# Patient Record
Sex: Male | Born: 1945 | Race: White | Hispanic: No | Marital: Married | Smoking: Former smoker
Health system: Southern US, Community
[De-identification: ages and names within clinical notes are randomized; demographics above are authoritative.]

## PROBLEM LIST (undated history)

## (undated) DIAGNOSIS — I714 Abdominal aortic aneurysm, without rupture, unspecified: Secondary | ICD-10-CM

## (undated) DIAGNOSIS — I10 Essential (primary) hypertension: Secondary | ICD-10-CM

## (undated) DIAGNOSIS — K449 Diaphragmatic hernia without obstruction or gangrene: Secondary | ICD-10-CM

## (undated) DIAGNOSIS — I739 Peripheral vascular disease, unspecified: Secondary | ICD-10-CM

## (undated) DIAGNOSIS — E785 Hyperlipidemia, unspecified: Secondary | ICD-10-CM

## (undated) DIAGNOSIS — I2510 Atherosclerotic heart disease of native coronary artery without angina pectoris: Secondary | ICD-10-CM

## (undated) DIAGNOSIS — R011 Cardiac murmur, unspecified: Secondary | ICD-10-CM

## (undated) DIAGNOSIS — M1710 Unilateral primary osteoarthritis, unspecified knee: Secondary | ICD-10-CM

## (undated) DIAGNOSIS — G4730 Sleep apnea, unspecified: Secondary | ICD-10-CM

## (undated) DIAGNOSIS — I219 Acute myocardial infarction, unspecified: Secondary | ICD-10-CM

## (undated) DIAGNOSIS — G4733 Obstructive sleep apnea (adult) (pediatric): Secondary | ICD-10-CM

## (undated) DIAGNOSIS — Z8679 Personal history of other diseases of the circulatory system: Secondary | ICD-10-CM

## (undated) DIAGNOSIS — M179 Osteoarthritis of knee, unspecified: Secondary | ICD-10-CM

## (undated) DIAGNOSIS — I252 Old myocardial infarction: Secondary | ICD-10-CM

## (undated) DIAGNOSIS — K649 Unspecified hemorrhoids: Secondary | ICD-10-CM

## (undated) HISTORY — PX: REPLACEMENT TOTAL KNEE: SUR1224

## (undated) HISTORY — DX: Unilateral primary osteoarthritis, unspecified knee: M17.10

## (undated) HISTORY — PX: CORONARY ARTERY BYPASS GRAFT: SHX141

## (undated) HISTORY — DX: Personal history of other diseases of the circulatory system: Z86.79

## (undated) HISTORY — DX: Peripheral vascular disease, unspecified: I73.9

## (undated) HISTORY — DX: Obstructive sleep apnea (adult) (pediatric): G47.33

## (undated) HISTORY — DX: Cardiac murmur, unspecified: R01.1

## (undated) HISTORY — DX: Abdominal aortic aneurysm, without rupture, unspecified: I71.40

## (undated) HISTORY — PX: CHOLECYSTECTOMY: SHX55

## (undated) HISTORY — DX: Abdominal aortic aneurysm, without rupture: I71.4

## (undated) HISTORY — DX: Atherosclerotic heart disease of native coronary artery without angina pectoris: I25.10

## (undated) HISTORY — DX: Diaphragmatic hernia without obstruction or gangrene: K44.9

## (undated) HISTORY — DX: Unspecified hemorrhoids: K64.9

## (undated) HISTORY — DX: Sleep apnea, unspecified: G47.30

## (undated) HISTORY — DX: Essential (primary) hypertension: I10

## (undated) HISTORY — DX: Osteoarthritis of knee, unspecified: M17.9

## (undated) HISTORY — DX: Old myocardial infarction: I25.2

## (undated) HISTORY — DX: Hyperlipidemia, unspecified: E78.5

---

## 1990-04-27 DIAGNOSIS — I252 Old myocardial infarction: Secondary | ICD-10-CM

## 1990-04-27 HISTORY — PX: CARDIAC CATHETERIZATION: SHX172

## 1990-04-27 HISTORY — DX: Old myocardial infarction: I25.2

## 1999-05-04 ENCOUNTER — Encounter: Payer: Self-pay | Admitting: Emergency Medicine

## 1999-05-04 ENCOUNTER — Inpatient Hospital Stay (HOSPITAL_COMMUNITY): Admission: EM | Admit: 1999-05-04 | Discharge: 1999-05-06 | Payer: Self-pay | Admitting: Emergency Medicine

## 1999-05-05 ENCOUNTER — Encounter: Payer: Self-pay | Admitting: Emergency Medicine

## 1999-05-12 ENCOUNTER — Inpatient Hospital Stay (HOSPITAL_COMMUNITY): Admission: RE | Admit: 1999-05-12 | Discharge: 1999-05-13 | Payer: Self-pay | Admitting: General Surgery

## 1999-05-12 ENCOUNTER — Encounter: Payer: Self-pay | Admitting: General Surgery

## 1999-05-12 ENCOUNTER — Encounter (INDEPENDENT_AMBULATORY_CARE_PROVIDER_SITE_OTHER): Payer: Self-pay | Admitting: Specialist

## 2002-02-23 ENCOUNTER — Ambulatory Visit (HOSPITAL_BASED_OUTPATIENT_CLINIC_OR_DEPARTMENT_OTHER): Admission: RE | Admit: 2002-02-23 | Discharge: 2002-02-23 | Payer: Self-pay | Admitting: Family Medicine

## 2003-04-28 HISTORY — PX: ABDOMINAL AORTIC ANEURYSM REPAIR: SHX42

## 2004-01-08 ENCOUNTER — Emergency Department (HOSPITAL_COMMUNITY): Admission: EM | Admit: 2004-01-08 | Discharge: 2004-01-09 | Payer: Self-pay | Admitting: Emergency Medicine

## 2004-02-05 ENCOUNTER — Encounter: Admission: RE | Admit: 2004-02-05 | Discharge: 2004-02-05 | Payer: Self-pay | Admitting: Vascular Surgery

## 2004-02-07 ENCOUNTER — Ambulatory Visit (HOSPITAL_COMMUNITY): Admission: RE | Admit: 2004-02-07 | Discharge: 2004-02-07 | Payer: Self-pay | Admitting: Vascular Surgery

## 2004-02-21 ENCOUNTER — Encounter (INDEPENDENT_AMBULATORY_CARE_PROVIDER_SITE_OTHER): Payer: Self-pay | Admitting: *Deleted

## 2004-02-21 ENCOUNTER — Inpatient Hospital Stay (HOSPITAL_COMMUNITY): Admission: RE | Admit: 2004-02-21 | Discharge: 2004-02-27 | Payer: Self-pay | Admitting: Vascular Surgery

## 2005-05-19 ENCOUNTER — Encounter: Admission: RE | Admit: 2005-05-19 | Discharge: 2005-05-19 | Payer: Self-pay | Admitting: Vascular Surgery

## 2006-11-19 ENCOUNTER — Emergency Department (HOSPITAL_COMMUNITY): Admission: EM | Admit: 2006-11-19 | Discharge: 2006-11-19 | Payer: Self-pay | Admitting: Emergency Medicine

## 2006-12-03 ENCOUNTER — Encounter: Admission: RE | Admit: 2006-12-03 | Discharge: 2006-12-03 | Payer: Self-pay | Admitting: Gastroenterology

## 2007-10-18 ENCOUNTER — Ambulatory Visit: Payer: Self-pay | Admitting: Vascular Surgery

## 2009-07-28 IMAGING — RF DG SMALL BOWEL
7 series · 7 of 7 positions shown · non-contrast
Comparison: none

CLINICAL DATA: Abnormal small bowel on [REDACTED] abdominal pelvic CT 11/19/06 with left sided pain, nausea, prior AAA surgery, cholecystectomy. 
 SMALL BOWEL SERIES:
 The preliminary abdominal radiograph is unremarkable.

[Series 1: run · 1 of 1 slices shown (1 of 4)]
[im 1/1]
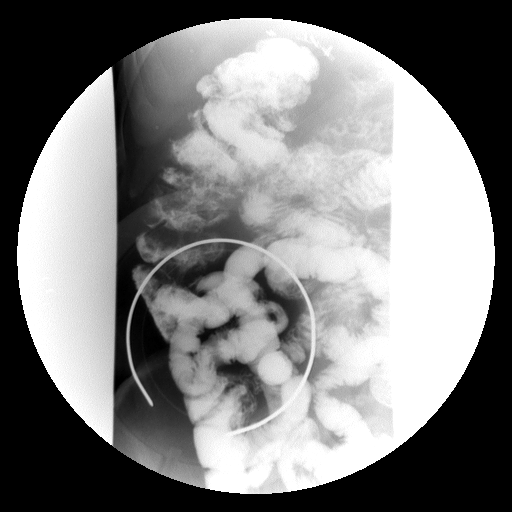

[Series 2: run · 1 of 1 slices shown (2 of 4)]
[im 1/1]
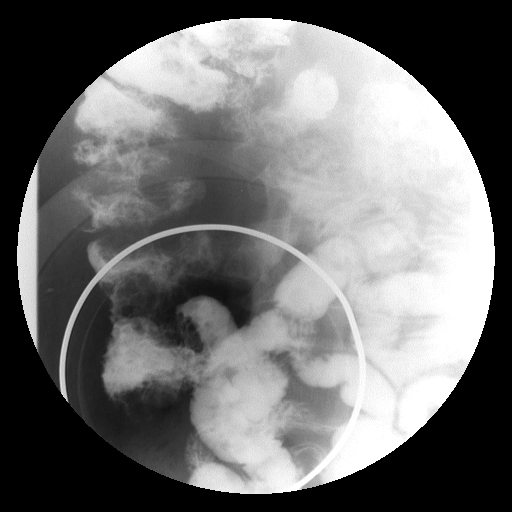

[Series 3: run · 1 of 1 slices shown (3 of 4)]
[im 1/1]
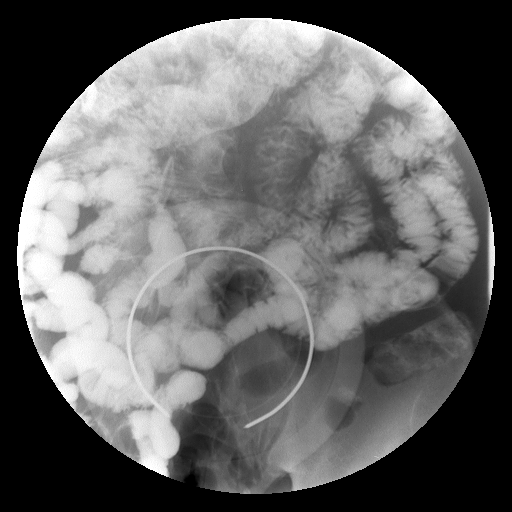

[Series 4: run · 1 of 1 slices shown (4 of 4)]
[im 1/1]
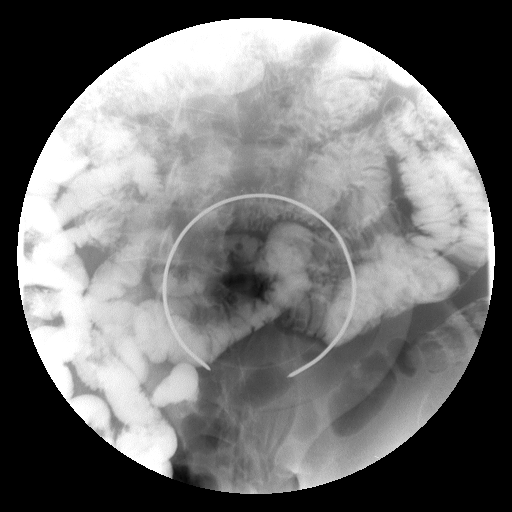

[Series 1001: view not recorded · 0.20mm/px · 1 of 1 slices shown (1 of 3)]
[im 1/1]
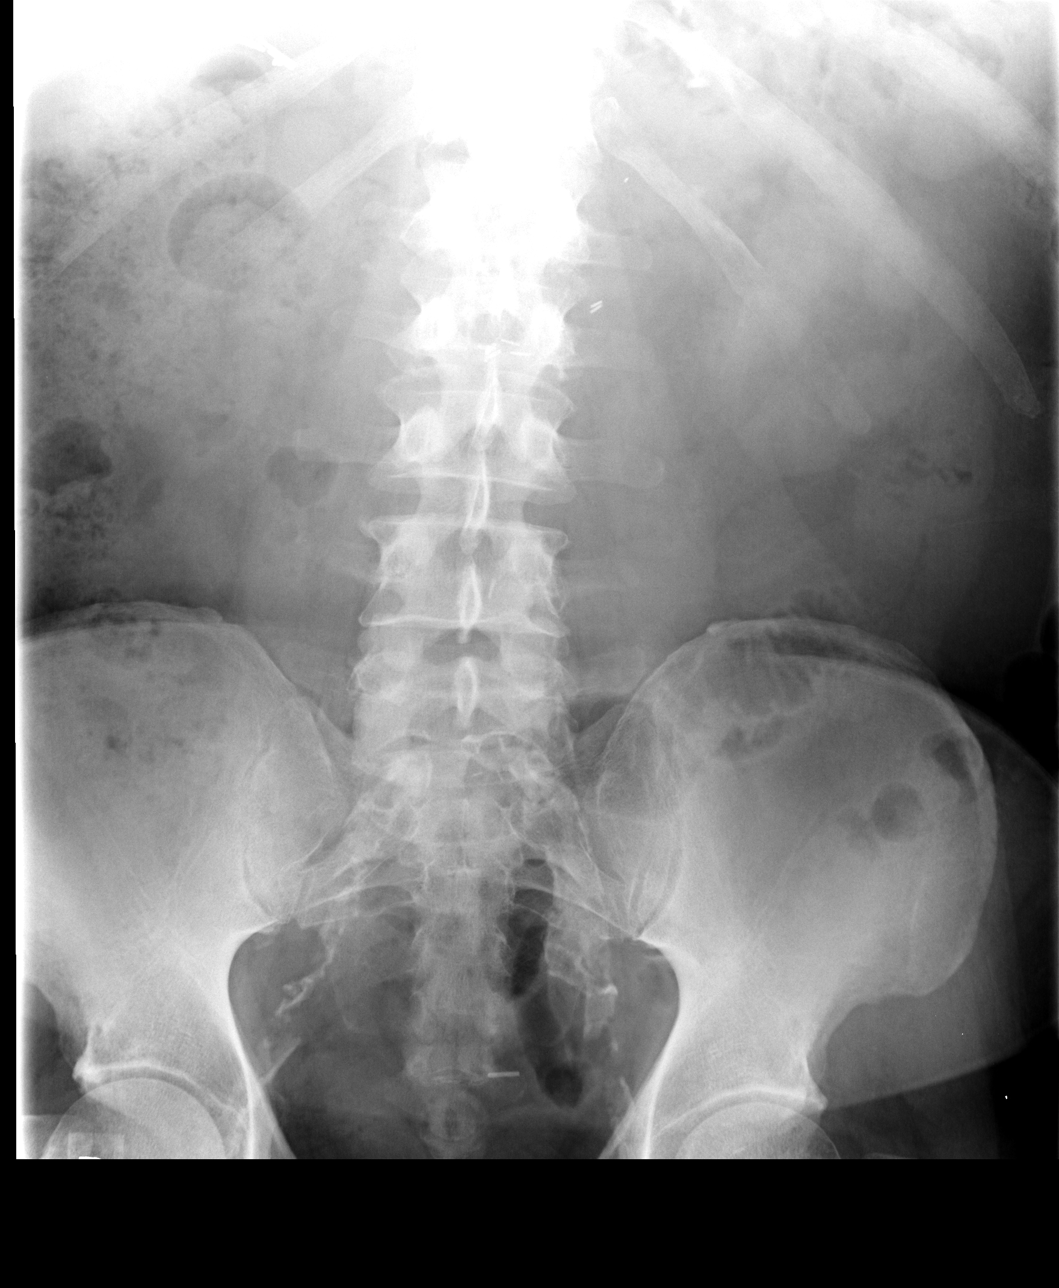

[Series 1002: view not recorded · 0.20mm/px · 1 of 1 slices shown (2 of 3)]
[im 1/1]
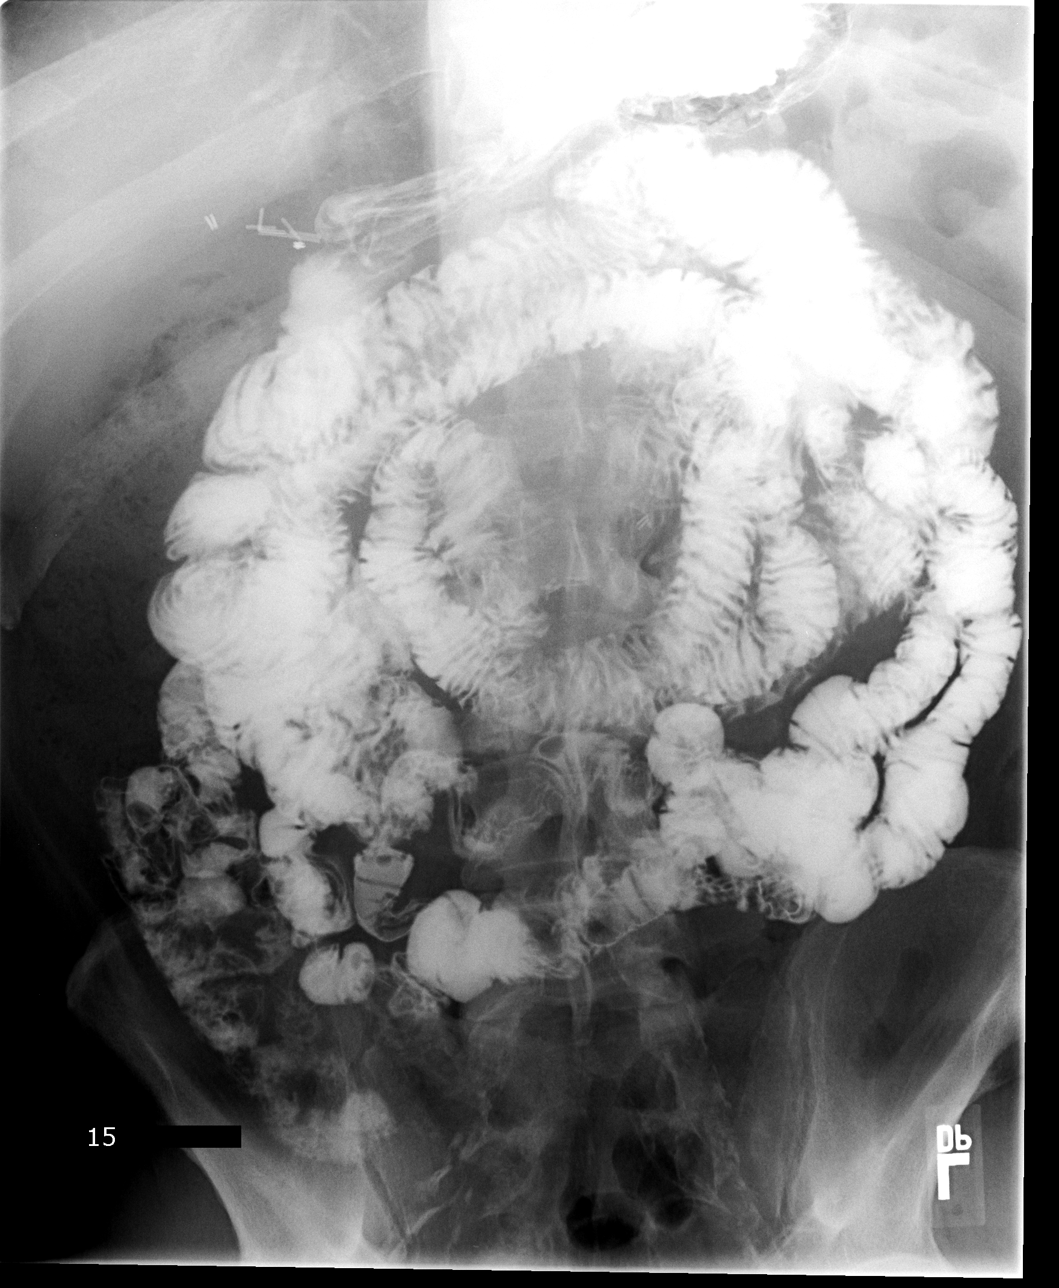

[Series 1003: view not recorded · 0.20mm/px · 1 of 1 slices shown (3 of 3)]
[im 1/1]
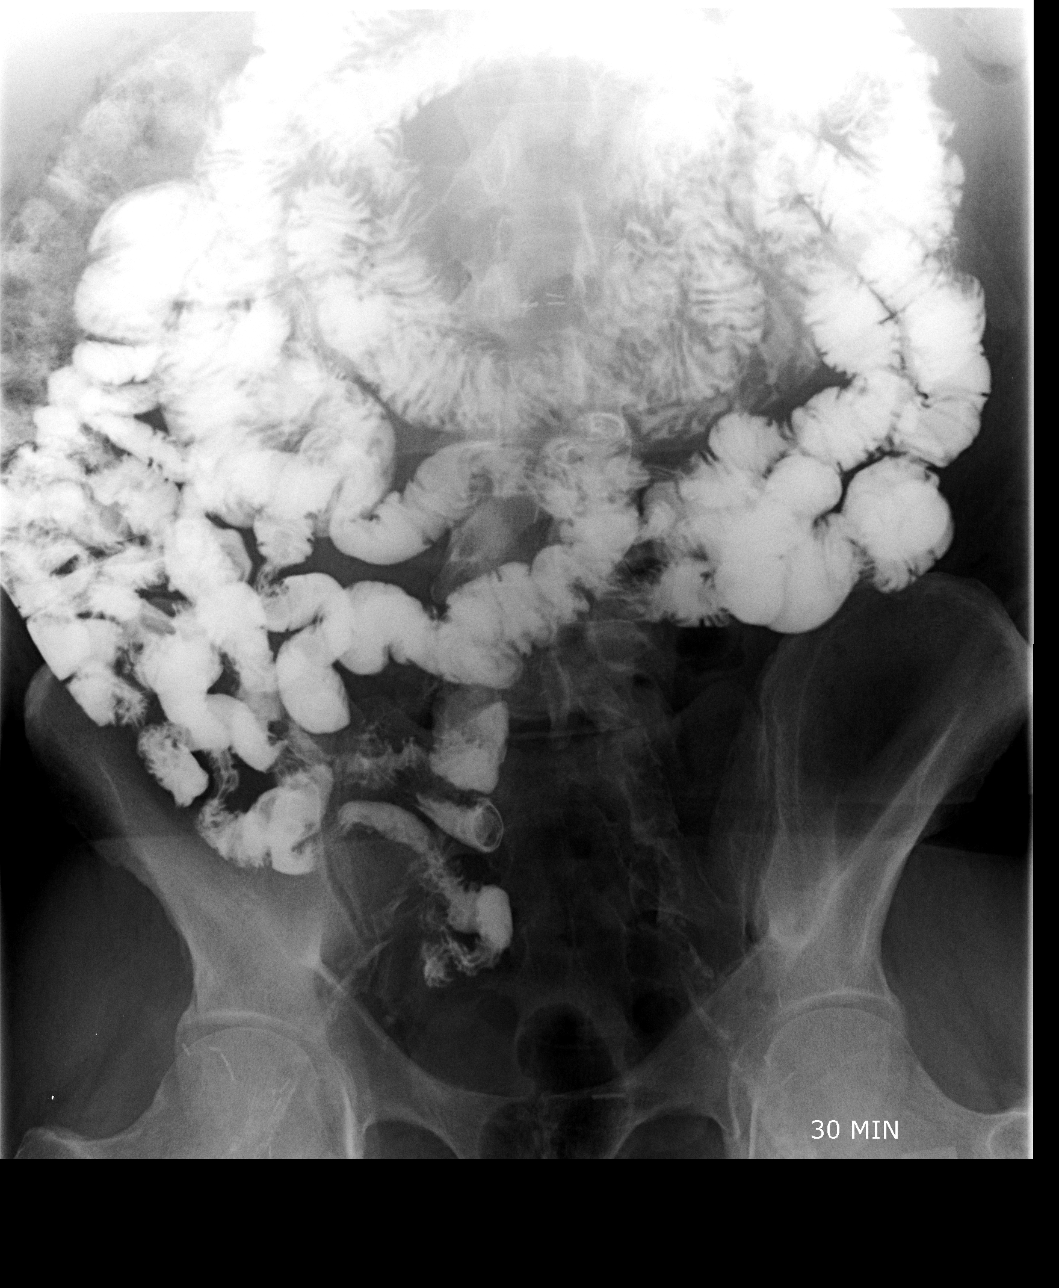

[7 of 7 positions shown; findings below may reference images not displayed]

Small bowel transit of contrast is within normal limits. There is no evidence of small bowel dilatation, strictures, or masses. The mucosal fold pattern is within normal limits. Spot films of the terminal ileum are unremarkable.  No obstructive nor inflammatory findings are seen specifically at the left lower quadrant in region of CT abnormality 11/19/06 consistent with interval resolution.  Barium transit time to colon is 30 minutes.  No change in vascular calcification, surgical clips of prior abdominal aortic aneurysmal surgery and cholecystectomy.
IMPRESSION: Normal small bowel series.

## 2010-05-18 ENCOUNTER — Encounter: Payer: Self-pay | Admitting: Vascular Surgery

## 2010-09-09 NOTE — Assessment & Plan Note (Signed)
OFFICE VISIT   HYDEN, SOLEY  DOB:  [birth year]                                       10/18/2007  OZHYQ#:[id number]   The patient returns for continued followup regarding his perirenal  aorta.  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  Last year, a CT scan was done at  [facility] and it was their opinion that the measurement was  overestimating the size because of an oblique neck of the suprarenal  aorta, and they felt that the suprarenal portion of the aorta was only  2.8 mm maximum diameter.  He also had a CT scan in August 2008, which I  reviewed today, which is similar to that finding with no evidence of  significant aneurysmal disease of the proximal renal arteries.  He is  biggest problems have been related to right knee pain in back pain.  He  has had no hemispheric or non-hemispheric TIAs, amaurosis fugax,  diplopia, blurred vision, syncope, chest pain, dyspnea on exertion, but  does have fatigue.   EXAM:  Blood pressure 141/85, heart rate 71, respirations 14.  Carotid  pulses 3+ with no audible bruits.  Neurologic:  Normal.  No palpable  adenopathy in the neck.  Chest:  Clear to auscultation.  Abdomen:  Soft,  nontender with no evidence of pulsatile mass.  Midline incision is well  healed.  He has 3+ femoral, popliteal, and dorsalis pedis pulses  bilaterally.  ABIs greater than 1 bilaterally.   I reassured him regarding all of these findings and do not think we need  to obtain CT scans on a regular basis.  He will return to see me on a  p.r.n. basis.   Quita Skye Hart Rochester, M.D.  Electronically Signed   JDL/MEDQ  D:  10/18/2007  T:  10/19/2007  Job:  [id number]   cc:   Chales Salmon. Abigail Miyamoto, M.D.

## 2010-09-12 NOTE — Op Note (Signed)
NAMEBENEDICT, Johnny [name]               ACCOUNT NO.:  [id number]   MEDICAL RECORD NO.:  [id number]          PATIENT TYPE:  OIB   LOCATION:  NA                           FACILITY:  [facility]   PHYSICIAN:  [name] Rochester, M.D.  DATE OF BIRTH:  1946/03/20   DATE OF PROCEDURE:  02/07/2004  DATE OF DISCHARGE:                                 OPERATIVE REPORT   PREOPERATIVE DIAGNOSIS:  Infrarenal abdominal aortic aneurysm.   POSTOPERATIVE DIAGNOSIS:  Infrarenal abdominal aortic aneurysm.   OPERATION PERFORMED:  Biplane abdominal aortogram via right common femoral  approach with bilateral iliac angiography.   SURGEON:  [name] Rochester, M.D.   ANESTHESIA:  Local Xylocaine and Versed 2 mg intravenously.   CONTRAST:  135 mL.   COMPLICATIONS:  None.   DESCRIPTION OF PROCEDURE:  The patient was taken to the [facility]  peripheral endovascular lab, placed in supine position at which time the  right groin was prepped with Betadine scrub and solution and draped in  routine sterile manner.  After infiltration with 1% Xylocaine, the right  common femoral artery was entered percutaneously, guidewire passed into the  suprarenal aorta under fluoroscopic guidance.  A 5 French sheath and dilator  were passed  over the guidewire, dilator removed and a standard graduated  pigtail catheter positioned in the suprarenal aorta.  Flush abdominal  aortogram was performed injecting 30 mL of contrast at 20 mL per second.  This revealed the aorta to be widely patent with single renal arteries  bilaterally.  Both renal arteries were also widely patent with no evidence  of any significant stenosis.  There was an infrarenal aneurysm beginning  approximately 1.5 to 2 cm distal to the renal arteries, which extended down  to the aortic bifurcation.  There was occlusion of the inferior mesenteric  artery.  Both iliac arteries were diffusely diseased with moderate stenosis  at the origin of the left common iliac artery  approximating 50 to 60% and an  approximate 90% stenosis in the distal left common iliac artery.  There was  a prominent bulge in the proximal right common iliac artery at the aortic  bifurcation.  Both distal common iliac arteries and the internal and  external iliac arteries were widely patent.  Lateral aortogram was then  performed injecting 20 mL of contrast 20 mL per second.  This revealed the  celiac axis and superior mesenteric arteries to be widely patent with no  stenosis.  The neck of the aneurysm angled somewhat anteriorly and there was  a significant bulge just at the level of the renal arteries and superiorly  with slight tapering just below the renal arteries before the infrarenal  aneurysm began. Following this, the catheter was withdrawn into the terminal  aorta and AP, LAO and RAO projections were all obtained injecting 20 mL of  contrast at 20 mL per second.  This revealed the iliac arteries to be widely  patent beginning in the distal common iliac arteries bilaterally, extending  into the internal and external iliac arteries down to the common femorals  and origin of the  superficial and profunda femoris bilaterally.  Having  tolerated the procedure well, the pigtail catheter was removed over the  guidewire.  The sheath removed, adequate compression applied.  No  complications ensued.   FINDINGS:  1.  Infrarenal abdominal aortic aneurysm.  2.  Single widely patent renal arteries bilaterally.  3.  90% stenosis distal left common iliac artery.  4.  Widely patent internal and external iliac arteries bilaterally.       JDL/MEDQ  D:  02/07/2004  T:  02/07/2004  Job:  [id number]

## 2010-09-12 NOTE — Op Note (Signed)
NAMEGATES, JIVIDEN NO.:  [id number]   MEDICAL RECORD NO.:  [id number]          PATIENT TYPE:  INP   LOCATION:  2306                         FACILITY:  [facility]   PHYSICIAN:  [name] Rochester, M.D.  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   OPERATION:  Resection and grafting of infrarenal abdominal aortic aneurysm  with insertion of an aorto-bi-proximal superficial femoral bypass using an  18 x 9 mm Hemashield Dacron graft.   SURGEON:  [name] Rochester, M.D.   FIRST ASSISTANT:  Di Kindle. Edilia Bo, M.D.   SECOND ASSISTANT:  Toribio Harbour, N.P.   ANESTHESIA:  General endotracheal.   PROCEDURE:  The patient was taken to the operating room and placed in the  supine position, at which time satisfactory general endotracheal anesthesia  was administered.  Abdomen and groins were prepped with Betadine scrub and  solution, draped in the routine sterile manner.  A Swan-Ganz catheter and  radial arterial line were inserted by anesthesia.  A midline incision was  made in the abdomen from xiphoid to pubis, carried down through subcutaneous  tissue and linea alba using the Bovie.  The peritoneal cavity was entered  and thoroughly explored.  Stomach, duodenum, small bowel, and colon were  unremarkable.  The liver was smooth and no palpable masses.  The gallbladder  was absent.  The transverse colon was elevated and the intestines were  reflected to the right side, retroperitoneum incised, exposing an infrarenal  aneurysm.  The main component of the aneurysm began about 3-4 cm distal to  the renal arteries, which measured about 6 cm in diameter.  This  extended  down to the bifurcation of the aorta, and both common iliac arteries were  heavily calcified throughout the abdomen down, including the external  iliacs.  There was a prominent bulge above the proximal neck, which extended  up to the superior mesenteric artery.  The aorta was exposed up to this  point to see if it was feasible to resect this, but the artery was diseased  up to the SMA and this did not appear to be feasible through this exposure,  and it was felt that it would be best to leave this smaller area of  aneurysmal dilatation in place, which probably was about 3-3.5 cm in  diameter, and concentrate on the infrarenal portion of the aneurysm, which  was 6 cm in diameter.  Longitudinal incisions were made in both inguinal  regions, common, superficial, and profunda femoris arteries dissected free.  There was concentric heavy calcification of both common femoral arteries  with no areas soft enough to allow grafting, and it was decided to bypass  into the proximal superficial femoral arteries bilaterally.  Retroperitoneal  tunnels were created posterior to the ureters.  The patient was given 25 g  of mannitol and then heparinized.  Aorta was occluded distal to the renal  arteries.  Actually, the aorta was occluded distal to the renal arteries but  this did not completely decompress the aneurysm, and therefore it was also  occluded proximal to the right renal artery with a DeBakey aneurysm clamp.  Both common iliac arteries were ligated with umbilical tapes.  Aneurysm was  opened anteriorly and a large amount of laminated thrombus removed and some  lumbars oversewn with 2-0 silk figure-of-eight sutures.  The neck of the  aneurysm was transected about 2-3 cm distal to the lowest (right) renal  artery).  An 18 x 9 Hemashield Dacron graft was anastomosed end-to-end to  the aortic stump using continuous 3-0 Prolene, buttressing this with a strip  of felt.  A few leaks were  repaired with 3-0 Prolene figure-of-eight sutures  with felt pledgets.  Limbs were delivered through the tunnel and end-to-side  anastomoses were done to the very proximal portion of the superficial  femoral arteries bilaterally and the right side opened first, followed by  the left side with no significant hypotension.  Following this, protamine  was given to reverse the heparin.  Following adequate hemostasis, the groins  were closed in layers with Vicryl and clips.  Aneurysm closed over the graft  with 3-0 Vicryl, retroperitoneum approximated with 3-0 Vicryl, the abdominal  wall with #1 Prolene, and the skin with clips.  A sterile dressing applied.  The patient taken to the recovery room in satisfactory condition.  He  received 500 mL of blood from [facility], no bank blood.  Had excellent  urinary output and is stable hemodynamically.       JDL/MEDQ  D:  02/21/2004  T:  02/21/2004  Job:  [id number]

## 2010-09-12 NOTE — Discharge Summary (Signed)
[place]  Patient:    Johnny [name]                       MRN: [id number] Adm. Date:  [id number] Disc. Date: 05/06/99 Attending:  Virgina Evener Dictator:   Marya Fossa, P.A. CC:         Genene Churn. Sherin Quarry, M.D.             Timothy E. Earlene Plater, M.D.             Runell Gess, M.D.             Chales Salmon. Abigail Miyamoto, M.D.                           Discharge Summary  ADMISSION DIAGNOSES:  1. Chest pain, rule out myocardial infarction versus gastrointestinal etiology.  2. Hypertension.  3. Hiatal hernia.  4. Known coronary artery disease status post coronary artery bypass grafting.  DISCHARGE DIAGNOSIS:  Chest pain, probable gastrointestinal etiology with acute  cholecystitis.  Myocardial infarction ruled out with negative enzymes.  HISTORY OF PRESENT ILLNESS:  Johnny [name] is a 65 year old married white male patient admitted to [facility] for evaluation of chest pain.  The patient has known coronary artery disease status post MI in 1992 and CABG x 4 vessels with a LIMA to the LAD, SVG to the diagonal branch, PDA and PLA sequentially.  His last Cardiolite performed July 09, 1997 showed mild septal ischemia with an ejection fraction of 67%.  He has a history of having an EKG showing sinus brachycardia without ST or T-wave abnormality.  The patient has done well, has been angina free for quite some time; however, this evening, the patient developed epigastric pain, chest tightness and shortness of breath shortly after eating a salami sandwich and lying down.  The pain lasted 45 minutes.  He presented to the emergency room which showed an EKG with T-wave inversion inferiorly.  He  does have a history of remote tobacco abuse.  The patient will be admitted for chest pain rule out myocardial infarction versus gastrointestinal etiology.  He is currently pain free.  Because he has had T-wave changes, we will add  Lovenox.  PROCEDURE PERFORMED:  None.  CONSULTATIONS:  Dr. Sherin Quarry with GI, May 05, 1999.  COMPLICATIONS:  None.  HOSPITAL COURSE:  Johnny [name] was admitted and placed on a telemetry bed.  He was pain free upon admission.  EKG revealed sinus bradycardia with T-wave inversions in 2, 3, AVF and flat T-waves V4 to 6.  He was placed on Lovenox for prophylaxis.  Cardiac enzymes were negative x 3.  White blood cell count 5200, hemoglobin 14.6, hematocrit 40.9, platelet count 258,000.  INR 1.0.  Sodium 137, potassium 3.5, UN 18 and creatinine 1.0.  SGOT elevated at 459, SGPT elevated at 282, alkaline phosphatase 95, total bilirubin elevated at 2.0.  Lipase elevated at 60, amylase normal at 95.  Due to elevated LFTs, a gallbladder ultrasound was ordered.  This revealed cholelithiasis.  The common bile duct was normal in size.  Consultation was sought from Dr. Haig Prophet group.  The consultation was performed by Dr. Sherin Quarry, who felt that the patient had acute cholecystitis with probably passed common bile duct stone.  He recommended surgical consultation for laparoscopic cholecystectomy.  If liver functions or pancreatic functions continue to be abnormal, he would consider ERCP prior to  laparoscopic cholecystectomy.  Follow-up liver function tests on May 06, 1999 revealed a total bilirubin of  1.8, which was decreased, alkaline phosphatase of 138, SGOT of 233 which was decreased and SGPT of 350 which was increased.  His amylase was stable at 64 and lipoase has decreased to 36 from 60.  Dr. Allyson Sabal called Dr. Earlene Plater in consultation for cholecystectomy.  They had a long discussion in regard to timing of the cholecystectomy.  Because the patient has a history of coronary artery disease and his last stress was one year ago, it was  felt that the patient would be best served by having an outpatient stress test nd then returning for an elective cholecystectomy if the patient is  currently pain  free.  The patient is agreeable to this.  The patient will be discharged to home on May 06, 1999.  He did have an episode of hypotension during his hospitalization systolic 109 and a bradycardic rate of 48.  The Lopressor which had been started when he was admitted with discontinued.  The patient will be discharged to home today on enteric coated aspirin 325 mg a  day, to start Prevacid 30 mg a day, Vasotec 5 mg a day, start on Norvasc 5 mg a day and sublingual nitroglycerin as needed for chest pain.  The patient is to perform activity as tolerated but to do no strenuous activity and should stay out of work until stress test results are back and cholecystectomy s performed.  He is to call our office for any problems or questions.  He should follow a low  fat, low cholesterol, low salt diet.  Stress Cardiolite has been scheduled for Thursday January 11 at 12:45.  The patient should see Dr. Earlene Plater in his office this week or early next week to scheduled elective cholecystectomy pending results of the stress Cardiolite. DD:  05/06/99 TD:  05/06/99 Job: [id number]
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
## 2010-11-03 ENCOUNTER — Encounter (HOSPITAL_COMMUNITY): Payer: Medicare Other

## 2010-11-03 ENCOUNTER — Ambulatory Visit (HOSPITAL_COMMUNITY)
Admission: RE | Admit: 2010-11-03 | Discharge: 2010-11-03 | Disposition: A | Discharge status: Home / Self Care | Payer: Medicare Other | Source: Ambulatory Visit | Attending: Orthopedic Surgery | Admitting: Orthopedic Surgery

## 2010-11-03 ENCOUNTER — Other Ambulatory Visit (HOSPITAL_COMMUNITY): Payer: Self-pay | Admitting: Orthopedic Surgery

## 2010-11-03 ENCOUNTER — Other Ambulatory Visit: Payer: Self-pay | Admitting: Orthopedic Surgery

## 2010-11-03 DIAGNOSIS — Z01818 Encounter for other preprocedural examination: Secondary | ICD-10-CM | POA: Insufficient documentation

## 2010-11-03 DIAGNOSIS — Z01812 Encounter for preprocedural laboratory examination: Secondary | ICD-10-CM | POA: Insufficient documentation

## 2010-11-03 DIAGNOSIS — I517 Cardiomegaly: Secondary | ICD-10-CM | POA: Insufficient documentation

## 2010-11-03 DIAGNOSIS — M1710 Unilateral primary osteoarthritis, unspecified knee: Secondary | ICD-10-CM

## 2010-11-03 DIAGNOSIS — I10 Essential (primary) hypertension: Secondary | ICD-10-CM | POA: Insufficient documentation

## 2010-11-03 DIAGNOSIS — Z951 Presence of aortocoronary bypass graft: Secondary | ICD-10-CM | POA: Insufficient documentation

## 2010-11-03 LAB — APTT: aPTT: 31 seconds (ref 24–37)

## 2010-11-03 LAB — URINALYSIS, ROUTINE W REFLEX MICROSCOPIC
Bilirubin Urine: NEGATIVE
Glucose, UA: NEGATIVE mg/dL
Hgb urine dipstick: NEGATIVE
Ketones, ur: NEGATIVE mg/dL
Leukocytes, UA: NEGATIVE
Nitrite: NEGATIVE
Protein, ur: NEGATIVE mg/dL
Specific Gravity, Urine: 1.025 (ref 1.005–1.030)
Urobilinogen, UA: 0.2 mg/dL (ref 0.0–1.0)
pH: 5.5 (ref 5.0–8.0)

## 2010-11-03 LAB — CBC
HCT: 42.9 % (ref 39.0–52.0)
Hemoglobin: 14.7 g/dL (ref 13.0–17.0)
MCH: 32 pg (ref 26.0–34.0)
MCHC: 34.3 g/dL (ref 30.0–36.0)
MCV: 93.5 fL (ref 78.0–100.0)
Platelets: 175 10*3/uL (ref 150–400)
RBC: 4.59 MIL/uL (ref 4.22–5.81)
RDW: 13 % (ref 11.5–15.5)
WBC: 5.7 10*3/uL (ref 4.0–10.5)

## 2010-11-03 LAB — COMPREHENSIVE METABOLIC PANEL
ALT: 23 U/L (ref 0–53)
AST: 24 U/L (ref 0–37)
Albumin: 4.3 g/dL (ref 3.5–5.2)
Alkaline Phosphatase: 88 U/L (ref 39–117)
BUN: 17 mg/dL (ref 6–23)
CO2: 27 mEq/L (ref 19–32)
Calcium: 9.7 mg/dL (ref 8.4–10.5)
Chloride: 103 mEq/L (ref 96–112)
Creatinine, Ser: 1.03 mg/dL (ref 0.50–1.35)
GFR calc Af Amer: >60 mL/min (ref >60)
GFR calc non Af Amer: >60 mL/min (ref >60)
Glucose, Bld: 118 mg/dL — ABNORMAL HIGH (ref 70–99)
Potassium: 4.1 mEq/L (ref 3.5–5.1)
Sodium: 140 mEq/L (ref 135–145)
Total Bilirubin: 0.8 mg/dL (ref 0.3–1.2)
Total Protein: 7.9 g/dL (ref 6.0–8.3)

## 2010-11-03 LAB — SURGICAL PCR SCREEN
MRSA, PCR: POSITIVE — AB
Staphylococcus aureus: POSITIVE — AB

## 2010-11-03 LAB — PROTIME-INR
INR: 0.93 (ref 0.00–1.49)
Prothrombin Time: 12.7 seconds (ref 11.6–15.2)

## 2010-11-10 ENCOUNTER — Inpatient Hospital Stay (HOSPITAL_COMMUNITY)
Admission: RE | Admit: 2010-11-10 | Discharge: 2010-11-13 | DRG: 470 | Disposition: A | Discharge status: Home Health | Payer: Medicare Other | Source: Ambulatory Visit | Attending: Orthopedic Surgery | Admitting: Orthopedic Surgery

## 2010-11-10 DIAGNOSIS — G4733 Obstructive sleep apnea (adult) (pediatric): Secondary | ICD-10-CM | POA: Diagnosis present

## 2010-11-10 DIAGNOSIS — Z01812 Encounter for preprocedural laboratory examination: Secondary | ICD-10-CM

## 2010-11-10 DIAGNOSIS — M1710 Unilateral primary osteoarthritis, unspecified knee: Principal | ICD-10-CM | POA: Diagnosis present

## 2010-11-10 DIAGNOSIS — Z951 Presence of aortocoronary bypass graft: Secondary | ICD-10-CM

## 2010-11-10 DIAGNOSIS — I2510 Atherosclerotic heart disease of native coronary artery without angina pectoris: Secondary | ICD-10-CM | POA: Diagnosis present

## 2010-11-10 DIAGNOSIS — I252 Old myocardial infarction: Secondary | ICD-10-CM

## 2010-11-10 DIAGNOSIS — I10 Essential (primary) hypertension: Secondary | ICD-10-CM | POA: Diagnosis present

## 2010-11-10 LAB — TYPE AND SCREEN
ABO/RH(D): B NEG
Antibody Screen: NEGATIVE

## 2010-11-10 LAB — ABO/RH: ABO/RH(D): B NEG

## 2010-11-11 LAB — BASIC METABOLIC PANEL
BUN: 13 mg/dL (ref 6–23)
CO2: 26 mEq/L (ref 19–32)
Calcium: 8.2 mg/dL — ABNORMAL LOW (ref 8.4–10.5)
Chloride: 99 mEq/L (ref 96–112)
Creatinine, Ser: 0.86 mg/dL (ref 0.50–1.35)
GFR calc Af Amer: >60 mL/min (ref >60)
GFR calc non Af Amer: >60 mL/min (ref >60)
Glucose, Bld: 159 mg/dL — ABNORMAL HIGH (ref 70–99)
Potassium: 4.9 mEq/L (ref 3.5–5.1)
Sodium: 135 mEq/L (ref 135–145)

## 2010-11-11 LAB — CBC
HCT: 34.5 % — ABNORMAL LOW (ref 39.0–52.0)
Hemoglobin: 12.1 g/dL — ABNORMAL LOW (ref 13.0–17.0)
MCH: 32.7 pg (ref 26.0–34.0)
MCHC: 35.1 g/dL (ref 30.0–36.0)
MCV: 93.2 fL (ref 78.0–100.0)
Platelets: 172 10*3/uL (ref 150–400)
RBC: 3.7 MIL/uL — ABNORMAL LOW (ref 4.22–5.81)
RDW: 13.1 % (ref 11.5–15.5)
WBC: 7.6 10*3/uL (ref 4.0–10.5)

## 2010-11-12 LAB — CBC
HCT: 31.2 % — ABNORMAL LOW (ref 39.0–52.0)
Hemoglobin: 10.5 g/dL — ABNORMAL LOW (ref 13.0–17.0)
MCH: 31.6 pg (ref 26.0–34.0)
MCHC: 33.7 g/dL (ref 30.0–36.0)
MCV: 94 fL (ref 78.0–100.0)
Platelets: 144 10*3/uL — ABNORMAL LOW (ref 150–400)
RBC: 3.32 MIL/uL — ABNORMAL LOW (ref 4.22–5.81)
RDW: 13.1 % (ref 11.5–15.5)
WBC: 7.9 10*3/uL (ref 4.0–10.5)

## 2010-11-12 LAB — BASIC METABOLIC PANEL
BUN: 8 mg/dL (ref 6–23)
CO2: 30 mEq/L (ref 19–32)
Calcium: 8.6 mg/dL (ref 8.4–10.5)
Chloride: 98 mEq/L (ref 96–112)
Creatinine, Ser: 0.94 mg/dL (ref 0.50–1.35)
GFR calc Af Amer: >60 mL/min (ref >60)
GFR calc non Af Amer: >60 mL/min (ref >60)
Glucose, Bld: 142 mg/dL — ABNORMAL HIGH (ref 70–99)
Potassium: 4.2 mEq/L (ref 3.5–5.1)
Sodium: 134 mEq/L — ABNORMAL LOW (ref 135–145)

## 2010-11-13 LAB — BASIC METABOLIC PANEL
BUN: 11 mg/dL (ref 6–23)
CO2: 31 mEq/L (ref 19–32)
Calcium: 8.6 mg/dL (ref 8.4–10.5)
Chloride: 97 mEq/L (ref 96–112)
Creatinine, Ser: 0.93 mg/dL (ref 0.50–1.35)
GFR calc Af Amer: >60 mL/min (ref >60)
GFR calc non Af Amer: >60 mL/min (ref >60)
Glucose, Bld: 138 mg/dL — ABNORMAL HIGH (ref 70–99)
Potassium: 3.5 mEq/L (ref 3.5–5.1)
Sodium: 134 mEq/L — ABNORMAL LOW (ref 135–145)

## 2010-11-13 LAB — CBC
HCT: 28.8 % — ABNORMAL LOW (ref 39.0–52.0)
Hemoglobin: 9.8 g/dL — ABNORMAL LOW (ref 13.0–17.0)
MCH: 31.9 pg (ref 26.0–34.0)
MCHC: 34 g/dL (ref 30.0–36.0)
MCV: 93.8 fL (ref 78.0–100.0)
Platelets: 142 10*3/uL — ABNORMAL LOW (ref 150–400)
RBC: 3.07 MIL/uL — ABNORMAL LOW (ref 4.22–5.81)
RDW: 13.2 % (ref 11.5–15.5)
WBC: 8.3 10*3/uL (ref 4.0–10.5)

## 2010-11-14 NOTE — Op Note (Signed)
[name], [name] NO.:  [id number]  MEDICAL RECORD NO.:  [id number]  LOCATION:  X001                         FACILITY:  [facility]  PHYSICIAN:  Ollen Gross, M.D.    DATE OF BIRTH:  [birth year]  DATE OF PROCEDURE:  11/10/2010 DATE OF DISCHARGE:                              OPERATIVE REPORT   PREOPERATIVE DIAGNOSIS:  Osteoarthritis, right knee.  POSTOPERATIVE DIAGNOSIS:  Osteoarthritis, right knee.  PROCEDURE:  Right total knee arthroplasty.  SURGEON:  Ollen Gross, M.D.  ASSISTANT:  Johnny [name], P.A.C.  ANESTHESIA:  Spinal.  ESTIMATED BLOOD LOSS:  Minimal.  DRAIN:  Hemovac x1.  TOURNIQUET TIME:  39 minutes at 300 mmHg.  COMPLICATIONS:  None.  CONDITION:  Stable to recovery.  CLINICAL NOTE:  Johnny [name] is a 65 year old male with advanced end-stage arthritis of the right knee with progressively worsening pain and dysfunction.  He has failed nonoperative management and presents for total knee arthroplasty.  PROCEDURE IN DETAIL:  After successful administration of spinal anesthetic, a tourniquet is placed on the right thigh and right lower extremity prepped and draped in usual sterile fashion.  Extremities wrapped in Esmarch, knee flexed, tourniquet inflated to 300 mmHg. Midline incision was made with a 10 blade through subcutaneous tissue to the level of the extensor mechanism.  A fresh blade is used to make a medial parapatellar arthrotomy.  Soft tissue on the proximal medial tibia is subperiosteally elevated to the joint line with the knife and into the semimembranosus bursa with a Cobb elevator.  Soft tissue laterally is elevated with attention being paid to avoid patellar tendon on tibial tubercle.  The patella was everted, knee flexed to 90 degrees and ACL and PCL removed.  Drill was used to create a starting hole in the distal femur and the canal was thoroughly irrigated.  The 5-degree right valgus alignment guide is placed and  the distal femoral cutting block is pinned to remove 11 mm off the distal femur.  Resection is made with an oscillating saw.  The tibia subluxed forward and the menisci removed.  The extramedullary tibial alignment guide is placed referencing proximally at the medial aspect of the tibial tubercle and distally along the second metatarsal axis and tibial crest.  Block is pinned to remove 2 mm off the more deficient medial side.  Tibial resection is made with an oscillating saw.  Size four is the most appropriate tibial component and the proximal tibia is prepared to modular drill and keel punch for the size four.  Femoral sizing guide is placed and size four is most appropriate. Rotations marked off the epicondylar axis.  It is confirmed by creating rectangular flexion gap at 90 degrees.  The block is pinned in this rotation and the anterior-posterior and chamfer cuts are made. Intercondylar block is placed and that cuts made.  Size four posterior stabilized femoral trial was placed.  A 12.5-mm posterior stabilized rotating platform insert trial was placed with the 12.5 full extensions achieved with excellent varus-valgus and anterior-posterior balance throughout full range of motion.  The patella was everted, thickness measured to 26 mm.  Freehand resection taken to 15 mm, 38 template is placed, lug  holes were drilled, trial patella is placed and it tracks normally.  Osteophytes removed off the posterior femur with the trial in place.  All trials are removed and the cut bone surfaces are prepared with pulsatile lavage.  Cement is mixed and once ready for implantation, a size four mobile bearing tibial tray, size four posterior stabilized femur and 38 patella are cemented into place.  The patella was held with a clamp.  Trial 12.5-mm inserts placed, knee held in full extension all extruded cement removed.  When cement has fully hardened then the permanent 12.5-mm posterior stabilized  rotating platform insert is placed in the tibial tray.  It was copiously irrigated with saline solution.  The arthrotomy closed over Hemovac drain with interrupted #1 PDS.  Flexion against gravity to 135 degrees, the patella tracks normally.  Tourniquet released for total time of 39 minutes.  Subcu was closed with interrupted 2-0 Vicryl and subcuticular running 4-0 Monocryl.  Catheter for Marcaine pain pump was placed and the pump is initiated.  Incisions were cleaned and dried and Steri-Strips and a bulky sterile dressing were applied.  The knee is placed into a knee immobilizer and he is awakened and transferred to recovery in stable condition.     Ollen Gross, M.D.     FA/MEDQ  D:  11/10/2010  T:  11/10/2010  Job:  [id number]  Electronically Signed by Ollen Gross M.D. on 11/14/2010 06:05:03 PM

## 2010-11-14 NOTE — H&P (Signed)
Johnny [name], Johnny [name] NO.:  [id number]  MEDICAL RECORD NO.:  [id number]  LOCATION:                                 FACILITY:  PHYSICIAN:  Ollen Gross, M.D.    DATE OF BIRTH:  1945/05/17  DATE OF ADMISSION:  11/10/2010 DATE OF DISCHARGE:                             HISTORY & PHYSICAL   DATE OF ADMISSION:  November 10, 2010  CHIEF COMPLAINT:  Right knee pain.  HISTORY OF PRESENT ILLNESS:  Patient is a 65 year old male who has been seen as a second opinion by Dr. Lequita Halt for ongoing right knee pain.  He has had pain in the knee for many years now, has progressively gotten worse.  He is an avid exerciser and it is starting to limit what he can and cannot do.  He was seen in the office where x-rays were found to have end-stage arthritis in the medial and also patellofemoral compartments, has already developed a 10-degree varus deformity with the knee.  It is felt that he would benefit from undergoing surgical intervention.  Risks and benefits have been discussed.  He would like to proceed with surgery.  He has been seen preoperatively by Dr. Nanetta Batty and felt to be acceptable cardiovascular risk for upcoming procedure.  He has had a stress test done preoperatively.  This test was performed on July 14, 2010.  He had a low-risk scan.  Post stress ejection fracture was about 57%.  He did have perfusion defect seen, which was consistent with a infarct.  He is known to have a past heart attack in 1992.  ALLERGIES:  No known drug allergies.  CURRENT MEDICATIONS: 1. Amlodipine 5 mg daily. 2. Enalapril 20 mg twice a day. 3. Famotidine 40 mg daily. 4. Pravastatin 80 mg daily. 5. Ecotrin aspirin 325 mg daily, stop 1 week prior to surgery.  PAST MEDICAL HISTORY: 1. Sleep apnea, uses CPAP. 2. Hypertension. 3. Coronary arterial disease. 4. History of myocardial infarction in 1992. 5. Cardiac murmur. 6. Abdominal aortic aneurysm. 7. Hiatal hernia. 8.  Childhood illnesses of rheumatic fever. 9. Hyperlipidemia. 10.Occasional bleeding hemorrhoids.  PAST SURGICAL HISTORY: 1. Right knee cartilage surgery in 1976. 2. He has had a cardiac catheterization followed by open heart     coronary artery bypass grafting of 4 vessels in 1992 post MI. 3. Abdominal aortic aneurysm repair in 2005. 4. Also, with bilateral femoral bypass surgeries. 5. Also, gallbladder surgery.  FAMILY HISTORY:  Father with dementia.  Mother with dementia and history of heart problems.  SOCIAL HISTORY:  Married, works in Airline pilot, past smoker, 2 to 3 drinks of alcohol daily.  Has 2 children.  His wife will be assisting with care after surgery.  Has 2 steps entering his home.  REVIEW OF SYSTEMS:  GENERAL:  No fevers, chills, or night sweats. NEUROLOGIC:  No seizures, syncope, or paralysis.  RESPIRATORY:  No shortness of breath, productive cough or hemoptysis, although he does get little bit shortness of breath on exertion, but no shortness of breath at rest.  This is after he is doing treadmill, but he is able to walk up a flight of steps or walk a city block  without getting short of breath.  CARDIOVASCULAR:  No chest pain or orthopnea.  GI:  No nausea, vomiting, diarrhea, or constipation.  GU:  No dysuria or hematuria.  He does have a little bit of nocturia.  MUSCULOSKELETAL:  Knee pain.  PHYSICAL EXAMINATION:  VITAL SIGNS:  Pulse 68, respirations 14, blood pressure 152/82. GENERAL:  Patient is a 65 year old white male, well nourished, well developed, in no acute distress, slightly overweight.  He is accompanied by his wife. HEENT:  Normocephalic, atraumatic.  Pupils are round and reactive.  EOMs intact. NECK:  Supple.  No carotid bruits are appreciated. CHEST:  Clear anterior and posterior chest walls. HEART:  Regular rate and rhythm with a grade 2/6 systolic ejection murmur best heard over aortic point. ABDOMEN:  Round, soft, slightly protuberant.  Bowel sounds  present. RECTAL/BREASTS/GENITALIA:  Not done, not part of present illness. EXTREMITIES:  Right knee does have a significant varus deformity by greater than 10 degrees.  His range of motion is 5-130, marked crepitus is noted.  He does have a painful cyst on the lateral knee.  IMPRESSION:  Osteoarthritis, right knee.  PLAN:  Patient admitted to [facility] to undergo right total knee replacement arthroplasty.  Surgery will be performed by Dr. Ollen Gross.     Alexzandrew L. Julien Girt, P.A.C.   ______________________________ Ollen Gross, M.D.    ALP/MEDQ  D:  11/09/2010  T:  11/09/2010  Job:  [id number]  cc:   Magnus Sinning) Tenny Craw, M.D. Fax: [phone]  Nanetta Batty, M.D. Fax: [phone]  [name] Rochester, M.D. [address] [place] [address]  Dr. Tyrone Sage  Electronically Signed by Patrica Duel P.A.C. on 11/12/2010 10:44:22 AM Electronically Signed by Ollen Gross M.D. on 11/14/2010 06:05:01 PM

## 2010-12-08 NOTE — Discharge Summary (Signed)
NAMEROME, ECHAVARRIA NO.:  [id number]  MEDICAL RECORD NO.:  [id number]  LOCATION:                                 FACILITY:  PHYSICIAN:  Ollen Gross, M.D.    DATE OF BIRTH:  03/09/1946  DATE OF ADMISSION:  11/10/2010 DATE OF DISCHARGE:  11/13/2010                              DISCHARGE SUMMARY   ADMITTING DIAGNOSES: 1. Osteoarthritis, right knee. 2. Sleep apnea. 3. Hypertension. 4. Coronary arterial disease. 5. History of myocardial infarction, 1992. 6. Cardiac murmur. 7. Abdominal aortic aneurysm. 8. Hiatal hernia. 9. Occasional bleeding hemorrhoids. 10.Hyperlipidemia. 11.Childhood illnesses rheumatic fever.  DISCHARGE DIAGNOSES: 1. Osteoarthritis of right knee, status post right total knee     replacement arthroplasty. 2. Postop acute blood loss anemia. 3. Postop hyponatremia. 4. Osteoarthritis, right knee. 5. Sleep apnea. 6. Hypertension. 7. Coronary arterial disease. 8. History of myocardial infarction, 1992. 9. Cardiac murmur. 10.Abdominal aortic aneurysm. 11.Hiatal hernia. 12.Occasional bleeding hemorrhoids. 13.Hyperlipidemia. 14.Childhood illnesses rheumatic fever.  DATE OF SURGERY:  November 10, 2010, right total knee.  SURGEON:  Ollen Gross, M.D.  ASSISTANT:  Alexzandrew L. Perkins, P.A.C.  ANESTHESIA:  Spinal.  TOURNIQUET TIME:  39 minutes.  CONSULTS:  None.  BRIEF HISTORY:  The patient is a 65 year old male with end-stage arthritis of right knee, progressive; worsening pain and dysfunction, failed nonoperative management, now presents for total knee arthroplasty.  LABORATORY DATA:  Preop CBC is not scanned in the chart, but the preop hemoglobin was 14.7 drift down to 10.5.  Last hemoglobin and hematocrit 9.8 and 28.8.  Chem panel on admission not scanned in the chart, but sodium did drop down to 134 which was its lowest point.  Remaining BMETs within normal limits.  Blood group type:  B negative.  HOSPITAL COURSE:   The patient admitted to the [facility], taken to the OR, underwent above-stated procedure without complication. The patient tolerated the procedure well, later transferred to the orthopedic floor.  Did pretty well on the evening of surgery.  Pain was under good control, started to get out of bed.  Started to CPAP postop and resumed home meds.  Added sublingual nitroglycerin as a precaution since the patient had a history of heart disease with coronary arterial disease and MI, but did not require any.  He was under good control by day #2.  The patient had kind of a rough afternoon yesterday with all of the walking he did and was progressing well with this therapy.  Incision looked good.  Continued to progress well by day #3.  The patient was doing well, meeting goals.  Sodium was stable.  Incision looked good and discharged home.  DISCHARGE PLAN: 1. The patient was discharged home on November 13, 2010. 2. Discharge diagnoses, please see above. 3. Discharge medications:  Robaxin, OxyIR, Xarelto.  Continue home    medications of amlodipine, clobetasol solution, enalapril,     famotidine, and pravastatin.  DIET:  Heart-healthy diet.  ACTIVITY:  Total knee protocol, home health PT.  Follow up in 2 weeks.  DISPOSITION:  Home.  CONDITION ON DISCHARGE:  Improved.     Alexzandrew L. Perkins, P.A.C.   ______________________________ Ollen Gross,  M.D.    ALP/MEDQ  D:  11/27/2010  T:  11/28/2010  Job:  [id number]  cc:   Nanetta Batty, M.D. Fax: [phone]  [name] Rochester, M.D. [address] [place] [address]  Sheliah Plane, MD [address] [place] [address]  Hessie Diener (Valla Leaver) Tenny Craw, M.D. Fax: [phone]  Electronically Signed by Patrica Duel P.A.C. on 12/03/2010 07:46:35 AM Electronically Signed by Ollen Gross M.D. on 12/08/2010 11:37:38 AM

## 2011-02-09 LAB — DIFFERENTIAL
Basophils Absolute: 0
Basophils Relative: 0
Eosinophils Absolute: 0.1
Eosinophils Relative: 1
Lymphocytes Relative: 10 — ABNORMAL LOW
Lymphs Abs: 1
Monocytes Absolute: 0.4
Monocytes Relative: 4
Neutro Abs: 8.4 — ABNORMAL HIGH
Neutrophils Relative %: 85 — ABNORMAL HIGH

## 2011-02-09 LAB — URINALYSIS, ROUTINE W REFLEX MICROSCOPIC
Bilirubin Urine: NEGATIVE
Glucose, UA: NEGATIVE
Hgb urine dipstick: NEGATIVE
Ketones, ur: NEGATIVE
Nitrite: NEGATIVE
Protein, ur: NEGATIVE
Specific Gravity, Urine: 1.021
Urobilinogen, UA: 0.2
pH: 6.5

## 2011-02-09 LAB — I-STAT 8, (EC8 V) (CONVERTED LAB)
Acid-Base Excess: 6 — ABNORMAL HIGH
BUN: 20
Bicarbonate: 30.7 — ABNORMAL HIGH
Chloride: 101
Glucose, Bld: 137 — ABNORMAL HIGH
HCT: 43
Hemoglobin: 14.6
Operator id: 282201
Potassium: 4.1
Sodium: 137
TCO2: 32
pCO2, Ven: 45.8
pH, Ven: 7.435 — ABNORMAL HIGH

## 2011-02-09 LAB — CBC
HCT: 41.4
Hemoglobin: 14.1
MCHC: 34.1
MCV: 92.5
Platelets: 186
RBC: 4.48
RDW: 13.2
WBC: 10

## 2011-02-09 LAB — POCT CARDIAC MARKERS
CKMB, poc: <1 — ABNORMAL LOW
Myoglobin, poc: 71.9
Operator id: 272551
Troponin i, poc: <0.05

## 2011-02-09 LAB — POCT I-STAT CREATININE
Creatinine, Ser: 1.3
Operator id: 282201

## 2011-03-04 ENCOUNTER — Other Ambulatory Visit: Payer: Self-pay | Admitting: Dermatology

## 2011-05-12 DIAGNOSIS — IMO0002 Reserved for concepts with insufficient information to code with codable children: Secondary | ICD-10-CM | POA: Diagnosis not present

## 2011-05-12 DIAGNOSIS — M1710 Unilateral primary osteoarthritis, unspecified knee: Secondary | ICD-10-CM | POA: Diagnosis not present

## 2011-08-04 DIAGNOSIS — H6120 Impacted cerumen, unspecified ear: Secondary | ICD-10-CM | POA: Diagnosis not present

## 2011-09-22 DIAGNOSIS — Z125 Encounter for screening for malignant neoplasm of prostate: Secondary | ICD-10-CM | POA: Diagnosis not present

## 2011-09-22 DIAGNOSIS — E7800 Pure hypercholesterolemia, unspecified: Secondary | ICD-10-CM | POA: Diagnosis not present

## 2011-09-22 DIAGNOSIS — I10 Essential (primary) hypertension: Secondary | ICD-10-CM | POA: Diagnosis not present

## 2011-09-22 DIAGNOSIS — R7301 Impaired fasting glucose: Secondary | ICD-10-CM | POA: Diagnosis not present

## 2011-09-25 DIAGNOSIS — I2510 Atherosclerotic heart disease of native coronary artery without angina pectoris: Secondary | ICD-10-CM | POA: Diagnosis not present

## 2011-09-25 DIAGNOSIS — G4733 Obstructive sleep apnea (adult) (pediatric): Secondary | ICD-10-CM | POA: Diagnosis not present

## 2011-09-25 DIAGNOSIS — I10 Essential (primary) hypertension: Secondary | ICD-10-CM | POA: Diagnosis not present

## 2011-09-25 DIAGNOSIS — E782 Mixed hyperlipidemia: Secondary | ICD-10-CM | POA: Diagnosis not present

## 2011-09-26 ENCOUNTER — Emergency Department (HOSPITAL_COMMUNITY)
Admission: EM | Admit: 2011-09-26 | Discharge: 2011-09-27 | Disposition: A | Discharge status: Home / Self Care | Payer: Medicare Other | Attending: Emergency Medicine | Admitting: Emergency Medicine

## 2011-09-26 ENCOUNTER — Encounter (HOSPITAL_COMMUNITY): Payer: Self-pay | Admitting: Emergency Medicine

## 2011-09-26 DIAGNOSIS — K5732 Diverticulitis of large intestine without perforation or abscess without bleeding: Secondary | ICD-10-CM | POA: Diagnosis not present

## 2011-09-26 DIAGNOSIS — I25810 Atherosclerosis of coronary artery bypass graft(s) without angina pectoris: Secondary | ICD-10-CM | POA: Diagnosis not present

## 2011-09-26 DIAGNOSIS — R109 Unspecified abdominal pain: Secondary | ICD-10-CM | POA: Insufficient documentation

## 2011-09-26 DIAGNOSIS — K5289 Other specified noninfective gastroenteritis and colitis: Secondary | ICD-10-CM | POA: Diagnosis not present

## 2011-09-26 DIAGNOSIS — I252 Old myocardial infarction: Secondary | ICD-10-CM | POA: Insufficient documentation

## 2011-09-26 DIAGNOSIS — K4020 Bilateral inguinal hernia, without obstruction or gangrene, not specified as recurrent: Secondary | ICD-10-CM | POA: Diagnosis not present

## 2011-09-26 DIAGNOSIS — R1032 Left lower quadrant pain: Secondary | ICD-10-CM | POA: Diagnosis not present

## 2011-09-26 DIAGNOSIS — R10814 Left lower quadrant abdominal tenderness: Secondary | ICD-10-CM | POA: Insufficient documentation

## 2011-09-26 DIAGNOSIS — K5792 Diverticulitis of intestine, part unspecified, without perforation or abscess without bleeding: Secondary | ICD-10-CM

## 2011-09-26 HISTORY — DX: Acute myocardial infarction, unspecified: I21.9

## 2011-09-26 LAB — COMPREHENSIVE METABOLIC PANEL
ALT: 10 U/L (ref 0–53)
AST: 15 U/L (ref 0–37)
Albumin: 4.1 g/dL (ref 3.5–5.2)
Alkaline Phosphatase: 88 U/L (ref 39–117)
BUN: 13 mg/dL (ref 6–23)
CO2: 25 mEq/L (ref 19–32)
Calcium: 9.3 mg/dL (ref 8.4–10.5)
Chloride: 100 mEq/L (ref 96–112)
Creatinine, Ser: 0.99 mg/dL (ref 0.50–1.35)
GFR calc Af Amer: >90 mL/min (ref >90)
GFR calc non Af Amer: 83 mL/min — ABNORMAL LOW (ref >90)
Glucose, Bld: 108 mg/dL — ABNORMAL HIGH (ref 70–99)
Potassium: 3.9 mEq/L (ref 3.5–5.1)
Sodium: 136 mEq/L (ref 135–145)
Total Bilirubin: 1.2 mg/dL (ref 0.3–1.2)
Total Protein: 7.6 g/dL (ref 6.0–8.3)

## 2011-09-26 LAB — URINALYSIS, ROUTINE W REFLEX MICROSCOPIC
Bilirubin Urine: NEGATIVE
Glucose, UA: NEGATIVE mg/dL
Hgb urine dipstick: NEGATIVE
Ketones, ur: NEGATIVE mg/dL
Leukocytes, UA: NEGATIVE
Nitrite: NEGATIVE
Protein, ur: NEGATIVE mg/dL
Specific Gravity, Urine: 1.017 (ref 1.005–1.030)
Urobilinogen, UA: 0.2 mg/dL (ref 0.0–1.0)
pH: 6.5 (ref 5.0–8.0)

## 2011-09-26 LAB — CBC
HCT: 41.8 % (ref 39.0–52.0)
Hemoglobin: 14.3 g/dL (ref 13.0–17.0)
MCH: 31.5 pg (ref 26.0–34.0)
MCHC: 34.2 g/dL (ref 30.0–36.0)
MCV: 92.1 fL (ref 78.0–100.0)
Platelets: 172 10*3/uL (ref 150–400)
RBC: 4.54 MIL/uL (ref 4.22–5.81)
RDW: 14.3 % (ref 11.5–15.5)
WBC: 7.9 10*3/uL (ref 4.0–10.5)

## 2011-09-26 MED ORDER — SODIUM CHLORIDE 0.9 % IV SOLN
INTRAVENOUS | Status: DC
  Administered 2011-09-27: via INTRAVENOUS

## 2011-09-26 NOTE — ED Notes (Addendum)
Pt c/o left sided abdominal pain x3 days. Pt states he hasn't been able to have a BM today but feels like he needs to. Pt was seen by PCP earlier today and was given an abx. Pt states he feels bloated. Pt has had slight nausea but denies vomiting.

## 2011-09-27 ENCOUNTER — Emergency Department (HOSPITAL_COMMUNITY): Payer: Medicare Other

## 2011-09-27 DIAGNOSIS — R109 Unspecified abdominal pain: Secondary | ICD-10-CM | POA: Diagnosis not present

## 2011-09-27 DIAGNOSIS — K4020 Bilateral inguinal hernia, without obstruction or gangrene, not specified as recurrent: Secondary | ICD-10-CM | POA: Diagnosis not present

## 2011-09-27 DIAGNOSIS — K5289 Other specified noninfective gastroenteritis and colitis: Secondary | ICD-10-CM | POA: Diagnosis not present

## 2011-09-27 MED ORDER — HYDROCODONE-ACETAMINOPHEN 5-325 MG PO TABS
2.0000 | ORAL_TABLET | Freq: Once | ORAL | Status: AC
  Administered 2011-09-27: 2 via ORAL
  Filled 2011-09-27: qty 2

## 2011-09-27 MED ORDER — ONDANSETRON 8 MG PO TBDP: 8.0000 mg | ORAL_TABLET | Freq: Three times a day (TID) | ORAL | Status: AC | PRN

## 2011-09-27 MED ORDER — HYDROCODONE-ACETAMINOPHEN 5-325 MG PO TABS: ORAL_TABLET | ORAL | Status: DC

## 2011-09-27 MED ORDER — METRONIDAZOLE 500 MG PO TABS: 500.0000 mg | ORAL_TABLET | Freq: Three times a day (TID) | ORAL | Status: AC

## 2011-09-27 MED ORDER — IOHEXOL 300 MG/ML  SOLN
100.0000 mL | Freq: Once | INTRAMUSCULAR | Status: AC | PRN
  Administered 2011-09-27: 100 mL via INTRAVENOUS

## 2011-09-27 NOTE — Discharge Instructions (Signed)
Drink plenty of fluids. Take the antibiotics until gone. Take the Norco for pain. Have Dr. Tenny Craw recheck you next week if not improving over the next 2-3 days. Return to the for high fever, vomiting where you are unable to take your  medications or if your pain gets severe.  Diverticulitis Small pockets or "bubbles" can develop in the wall of the intestine. Diverticulitis is when those pockets become infected and inflamed. This causes stomach pain (usually on the left side). HOME CARE  Take all medicine as told by your doctor.   Try a clear liquid diet (broth, tea, or water) for as long as told by your doctor.   Keep all follow-up visits with your doctor.   You may be put on a low-fiber diet once you start feeling better. Here are foods that have low-fiber:   White breads, cereals, rice, and pasta.   Cooked fruits and vegetables or soft fresh fruits and vegetables without the skin.   Ground or well-cooked tender beef, ham, veal, lamb, pork, or poultry.   Eggs and seafood.   After you are doing well on the low-fiber diet, you may be put on a high-fiber diet. Here are ways to increase your fiber:   Choose whole-grain breads, cereals, pasta, and brown rice.   Choose fruits and vegetables with skin on. Do not overcook the vegetables.   Choose nuts, seeds, legumes, dried peas, beans, and lentils.   Look for food products that have more than 3 grams of fiber per serving on the food label.  GET HELP RIGHT AWAY IF:  Your pain does not get better or gets worse.   You have trouble eating food.   You are not pooping (having bowel movements) like normal.   You have a temperature by mouth above 102 F (38.9 C), not controlled by medicine.   You keep throwing up (vomiting).   You have bloody or black, tarry poop (stools).   You are getting worse and not better.  MAKE SURE YOU:   Understand these instructions.   Will watch your condition.   Will get help right away if you are not  doing well or get worse.  Document Released: 09/30/2007 Document Revised: 04/02/2011 Document Reviewed: 03/04/2009 [facility] Patient Information 2012 [place], [place].

## 2011-09-27 NOTE — ED Provider Notes (Signed)
History     CSN: [id number]  Arrival date & time 09/26/11  2104   First MD Initiated Contact with Patient 09/26/11 2301      Chief Complaint  Patient presents with  . Abdominal Pain    left side    (Consider location/radiation/quality/duration/timing/severity/associated sxs/prior treatment) HPI  Patient relates for the past 3 days he's had stabbing pain in his left lower quadrant. He denies having pain in his left flank. He states he's had no bowel movement all day and took some magnesium citrate and then had a bowel movement when he arrived in the ER. He relates his pain seemed to be getting worse about 6 PM. He relates he's been straining for the past year and has been having his hemorrhoids flare up. He denies nausea, vomiting, or fever. He states recently did a bunch yard work and [employer] and has been having pain in his neck, back, and he was seen by his doctor 6 days ago for that. He was seen today about 12 noon for the same pain that he is here for today. He states he was diagnosed with diverticulitis and started with Cipro which she's only taken one dose. He relates walking and leaning over makes the pain worse, nothing makes it feel better.  PCP Dr. Tenny Craw, patient was seen for yearly physical 5 days ago.  Past Medical History  Diagnosis Date  . Heart attack     Past Surgical History  Procedure Date  . Coronary artery bypass graft   . Replacement total knee    cholecystectomy AAA repair  History reviewed. No pertinent family history.  History  Substance Use Topics  . Smoking status: Former Smoker    Quit date: 08/26/1990  . Smokeless tobacco: Not on file  . Alcohol Use: Yes     occassional   employed Lives with spouse   Review of Systems  All other systems reviewed and are negative.    Allergies  Review of patient's allergies indicates no known allergies.  Home Medications   Current Outpatient Rx  Name Route Sig Dispense Refill  . AMLODIPINE  BESYLATE 10 MG PO TABS Oral Take 10 mg by mouth daily.    Marland Kitchen CIPROFLOXACIN HCL 500 MG PO TABS Oral Take 500 mg by mouth 2 (two) times daily.    . ENALAPRIL MALEATE 20 MG PO TABS Oral Take 20 mg by mouth 2 (two) times daily.    Marland Kitchen FAMOTIDINE 40 MG PO TABS Oral Take 40 mg by mouth daily.    Marland Kitchen PRAVASTATIN SODIUM 80 MG PO TABS Oral Take 80 mg by mouth daily.      BP 146/72  Pulse 91  Temp(Src) 98.4 F (36.9 C) (Oral)  Resp 22  SpO2 97%  Vital signs normal    Physical Exam  Nursing note and vitals reviewed. Constitutional: He is oriented to person, place, and time. He appears well-developed and well-nourished.  Non-toxic appearance. He does not appear ill. No distress.  HENT:  Head: Normocephalic and atraumatic.  Right Ear: External ear normal.  Left Ear: External ear normal.  Nose: Nose normal. No mucosal edema or rhinorrhea.  Mouth/Throat: Oropharynx is clear and moist and mucous membranes are normal. No dental abscesses or uvula swelling.  Eyes: Conjunctivae and EOM are normal. Pupils are equal, round, and reactive to light.  Neck: Normal range of motion and full passive range of motion without pain. Neck supple.  Cardiovascular: Normal rate, regular rhythm and normal heart sounds.  Exam  reveals no gallop and no friction rub.   No murmur heard. Pulmonary/Chest: Effort normal and breath sounds normal. No respiratory distress. He has no wheezes. He has no rhonchi. He has no rales. He exhibits no tenderness and no crepitus.  Abdominal: Soft. Normal appearance and bowel sounds are normal. He exhibits no distension. There is tenderness in the left lower quadrant. There is no rebound and no guarding.  Musculoskeletal: Normal range of motion. He exhibits no edema and no tenderness.       Moves all extremities well.   Neurological: He is alert and oriented to person, place, and time. He has normal strength. No cranial nerve deficit.  Skin: Skin is warm, dry and intact. No rash noted. No  erythema. No pallor.  Psychiatric: He has a normal mood and affect. His speech is normal and behavior is normal. His mood appears not anxious.    ED Course  Procedures (including critical care time)   Medications  0.9 %  sodium chloride infusion (  Intravenous New Bag/Given 09/27/11 0015)  iohexol (OMNIPAQUE) 300 MG/ML solution 100 mL (100 mL Intravenous Contrast Given 09/27/11 0132)  norco 2 tabs po   Results for orders placed during the hospital encounter of 09/26/11  URINALYSIS, ROUTINE W REFLEX MICROSCOPIC      Component Value Range   Color, Urine YELLOW  YELLOW    APPearance CLEAR  CLEAR    Specific Gravity, Urine 1.017  1.005 - 1.030    pH 6.5  5.0 - 8.0    Glucose, UA NEGATIVE  NEGATIVE (mg/dL)   Hgb urine dipstick NEGATIVE  NEGATIVE    Bilirubin Urine NEGATIVE  NEGATIVE    Ketones, ur NEGATIVE  NEGATIVE (mg/dL)   Protein, ur NEGATIVE  NEGATIVE (mg/dL)   Urobilinogen, UA 0.2  0.0 - 1.0 (mg/dL)   Nitrite NEGATIVE  NEGATIVE    Leukocytes, UA NEGATIVE  NEGATIVE   COMPREHENSIVE METABOLIC PANEL      Component Value Range   Sodium 136  135 - 145 (mEq/L)   Potassium 3.9  3.5 - 5.1 (mEq/L)   Chloride 100  96 - 112 (mEq/L)   CO2 25  19 - 32 (mEq/L)   Glucose, Bld 108 (*) 70 - 99 (mg/dL)   BUN 13  6 - 23 (mg/dL)   Creatinine, Ser 1.61  0.50 - 1.35 (mg/dL)   Calcium 9.3  8.4 - 09.6 (mg/dL)   Total Protein 7.6  6.0 - 8.3 (g/dL)   Albumin 4.1  3.5 - 5.2 (g/dL)   AST 15  0 - 37 (U/L)   ALT 10  0 - 53 (U/L)   Alkaline Phosphatase 88  39 - 117 (U/L)   Total Bilirubin 1.2  0.3 - 1.2 (mg/dL)   GFR calc non Af Amer 83 (*) >90 (mL/min)   GFR calc Af Amer >90  >90 (mL/min)  CBC      Component Value Range   WBC 7.9  4.0 - 10.5 (K/uL)   RBC 4.54  4.22 - 5.81 (MIL/uL)   Hemoglobin 14.3  13.0 - 17.0 (g/dL)   HCT 04.5  40.9 - 81.1 (%)   MCV 92.1  78.0 - 100.0 (fL)   MCH 31.5  26.0 - 34.0 (pg)   MCHC 34.2  30.0 - 36.0 (g/dL)   RDW 91.4  78.2 - 95.6 (%)   Platelets 172  150 - 400  (K/uL)   Laboratory interpretation all normal    Ct Abdomen Pelvis W Contrast  09/27/2011  *  RADIOLOGY REPORT*  Clinical Data: Abdominal pain for the past 3 days.  Constipation. Abdominal distention.  Slight nausea.  CT ABDOMEN AND PELVIS WITH CONTRAST  Technique:  Multidetector CT imaging of the abdomen and pelvis was performed following the standard protocol during bolus administration of intravenous contrast.  Contrast: OMNIPAQUE IOHEXOL 300 MG/ML  SOLN  Comparison: 11/19/2006.  Findings: Cholecystectomy clips.  Atheromatous arterial calcifications.  Aorto bifemoral bypass graft.  Bilateral inguinal hernias containing fat.  Unremarkable liver, spleen, pancreas, adrenal glands, kidneys and urinary bladder.  Mildly enlarged prostate gland.  Soft tissue stranding adjacent to the distal descending and proximal sigmoid colon.  No fluid collections seen.  No visualized diverticula.  Bilateral lower lobe atelectasis, greater on the left.  Post CABG changes.  Lumbar and lower thoracic spine degenerative changes.  IMPRESSION:  1.  Inflammatory changes adjacent to the distal descending and proximal sigmoid colon.  This could be due to diverticulitis involving a non-visualized diverticulum.  No abscess. 2.  Bilateral inguinal hernias containing fat. 3.  Mild bilateral lower lobe atelectasis, greater on the left.  Original Report Authenticated By: Darrol Angel, M.D.      1. Abdominal pain   2. Diverticulitis     New Prescriptions   HYDROCODONE-ACETAMINOPHEN (NORCO) 5-325 MG PER TABLET    Take 1 or 2 po Q 6hrs for pain   METRONIDAZOLE (FLAGYL) 500 MG TABLET    Take 1 tablet (500 mg total) by mouth 3 (three) times daily.   ONDANSETRON (ZOFRAN ODT) 8 MG DISINTEGRATING TABLET    Take 1 tablet (8 mg total) by mouth every 8 (eight) hours as needed for nausea.    Plan discharge  Devoria Albe, MD, FACEP   MDM          Ward Givens, MD 09/27/11 [phone]

## 2011-09-27 NOTE — ED Notes (Signed)
Patient transported to CT 

## 2011-09-27 NOTE — ED Notes (Signed)
Drinking oral contrast 

## 2011-09-27 NOTE — ED Notes (Signed)
Returned from CT.

## 2011-10-08 DIAGNOSIS — Q143 Congenital malformation of choroid: Secondary | ICD-10-CM | POA: Diagnosis not present

## 2011-12-22 DIAGNOSIS — IMO0002 Reserved for concepts with insufficient information to code with codable children: Secondary | ICD-10-CM | POA: Diagnosis not present

## 2011-12-22 DIAGNOSIS — M1710 Unilateral primary osteoarthritis, unspecified knee: Secondary | ICD-10-CM | POA: Diagnosis not present

## 2011-12-30 DIAGNOSIS — L570 Actinic keratosis: Secondary | ICD-10-CM | POA: Diagnosis not present

## 2011-12-30 DIAGNOSIS — L259 Unspecified contact dermatitis, unspecified cause: Secondary | ICD-10-CM | POA: Diagnosis not present

## 2012-01-11 DIAGNOSIS — Z86010 Personal history of colonic polyps: Secondary | ICD-10-CM | POA: Diagnosis not present

## 2012-01-11 DIAGNOSIS — Z09 Encounter for follow-up examination after completed treatment for conditions other than malignant neoplasm: Secondary | ICD-10-CM | POA: Diagnosis not present

## 2012-04-13 DIAGNOSIS — I10 Essential (primary) hypertension: Secondary | ICD-10-CM | POA: Diagnosis not present

## 2012-04-13 DIAGNOSIS — I2510 Atherosclerotic heart disease of native coronary artery without angina pectoris: Secondary | ICD-10-CM | POA: Diagnosis not present

## 2012-04-13 DIAGNOSIS — E782 Mixed hyperlipidemia: Secondary | ICD-10-CM | POA: Diagnosis not present

## 2012-06-03 DIAGNOSIS — M545 Low back pain, unspecified: Secondary | ICD-10-CM | POA: Diagnosis not present

## 2012-06-30 ENCOUNTER — Other Ambulatory Visit (HOSPITAL_COMMUNITY): Payer: Self-pay | Admitting: Cardiovascular Disease

## 2012-06-30 DIAGNOSIS — I25810 Atherosclerosis of coronary artery bypass graft(s) without angina pectoris: Secondary | ICD-10-CM

## 2012-07-07 ENCOUNTER — Ambulatory Visit (HOSPITAL_COMMUNITY)
Admission: RE | Admit: 2012-07-07 | Discharge: 2012-07-07 | Disposition: A | Discharge status: Home / Self Care | Payer: Medicare Other | Source: Ambulatory Visit | Attending: Cardiovascular Disease | Admitting: Cardiovascular Disease

## 2012-07-07 DIAGNOSIS — R079 Chest pain, unspecified: Secondary | ICD-10-CM | POA: Diagnosis not present

## 2012-07-07 DIAGNOSIS — I25810 Atherosclerosis of coronary artery bypass graft(s) without angina pectoris: Secondary | ICD-10-CM

## 2012-07-07 DIAGNOSIS — I2510 Atherosclerotic heart disease of native coronary artery without angina pectoris: Secondary | ICD-10-CM | POA: Diagnosis not present

## 2012-07-07 DIAGNOSIS — I10 Essential (primary) hypertension: Secondary | ICD-10-CM | POA: Insufficient documentation

## 2012-07-07 DIAGNOSIS — I739 Peripheral vascular disease, unspecified: Secondary | ICD-10-CM | POA: Diagnosis not present

## 2012-07-07 DIAGNOSIS — R0602 Shortness of breath: Secondary | ICD-10-CM | POA: Insufficient documentation

## 2012-07-07 MED ORDER — TECHNETIUM TC 99M SESTAMIBI GENERIC - CARDIOLITE
30.0000 | Freq: Once | INTRAVENOUS | Status: AC | PRN
  Administered 2012-07-07: 30 via INTRAVENOUS

## 2012-07-07 MED ORDER — REGADENOSON 0.4 MG/5ML IV SOLN
0.4000 mg | Freq: Once | INTRAVENOUS | Status: AC
  Administered 2012-07-07: 0.4 mg via INTRAVENOUS

## 2012-07-07 MED ORDER — TECHNETIUM TC 99M SESTAMIBI GENERIC - CARDIOLITE
10.0000 | Freq: Once | INTRAVENOUS | Status: AC | PRN
  Administered 2012-07-07: 10 via INTRAVENOUS

## 2012-07-07 NOTE — Procedures (Addendum)
[facility] [address] [place] [address] [place] [address] [phone]  Cardiology Nuclear Med Study  Johnny [name] is a 67 y.o. male     MRN : [id number]     DOB: [birth year]  Procedure Date: 07/07/2012  Nuclear Med Background Indication for Stress Test:  Graft Patency and IRBBB History:  CAD;MI-1992;CABG-1992 Cardiac Risk Factors: Family History - CAD, History of Smoking, Hypertension, Lipids, Obesity, PVD and AAA;AORTIC BIFEMORAL BYPASS  Symptoms:  Chest Pain, Fatigue and SOB   Nuclear Pre-Procedure Caffeine/Decaff Intake:  7:00pm NPO After: 5:00am   IV Site: R Forearm  IV 0.9% NS with Angio Cath:  22g  Chest Size (in):  46" IV Started by: Emmit Pomfret, RN  Height: 5\' 8"  (1.727 m)  Cup Size: n/a  BMI:  Body mass index is 32.39 kg/(m^2). Weight:  213 lb (96.616 kg)   Tech Comments:  N/A    Nuclear Med Study 1 or 2 day study: 1 day  Stress Test Type:  Lexiscan  Order Authorizing Thoren Hosang:  Obie Dredge   Resting Radionuclide: Technetium 45m Sestamibi  Resting Radionuclide Dose: 9.9 mCi   Stress Radionuclide:  Technetium 32m Sestamibi  Stress Radionuclide Dose: 30.8 mCi           Stress Protocol Rest HR: 60 Stress HR: 69  Rest BP: 152/91 Stress BP: 159  Exercise Time (min): n/a METS: n/a   Predicted Max HR: 152 bpm % Max HR: 45.39 bpm Rate Pressure Product: 40102  Dose of Adenosine (mg):  n/a Dose of Lexiscan: 0.4 mg  Dose of Atropine (mg): n/a Dose of Dobutamine: n/a mcg/kg/min (at max HR)  Stress Test Technologist: Esperanza Sheets, CCT Nuclear Technologist: Koren Shiver, CNMT   Rest Procedure:  Myocardial perfusion imaging was performed at rest 45 minutes following the intravenous administration of Technetium 58m Sestamibi. Stress Procedure:  The patient received IV Lexiscan 0.4 mg over 15-seconds.  Technetium 15m Sestamibi injected at 30-seconds.  There were no significant changes with Lexiscan.  Quantitative  spect images were obtained after a 45 minute delay.  Transient Ischemic Dilatation (Normal <1.22):  1.15 Lung/Heart Ratio (Normal <0.45):  0.29 QGS EDV:  109 ml QGS ESV:  52 ml LV Ejection Fraction: 52%  Rest ECG: NSR-LVH  Stress ECG: No significant change from baseline ECG  QPS Raw Data Images:  There is interference from nuclear activity from structures below the diaphragm. This does not affect the ability to read the study. Stress Images:  Fixed basal to mid inferior / lateral defect, and fixed proximal to mid anteroseptal defect. Rest Images:  Fixed basal to mid inferior / lateral defect, and fixed proximal to mid anteroseptal defect Subtraction (SDS):  No evidence of ischemia.  Impression Exercise Capacity:  Lexiscan with no exercise. BP Response:  Normal blood pressure response. Clinical Symptoms:  No significant symptoms noted. ECG Impression:  No significant ST segment change suggestive of ischemia.  Comparison with Prior Nuclear Study: Compared to a prior study 2 years ago, there is no change in the inferior defect and there appears to be no change in the anteroseptal fixed defect.  Overall Impression:  Low risk stress nuclear study. Fixed inferolateral defect which appears to be scar. There is a fixed anteroseptal defect, which may be artifact or septal perforator territory scar and was present in the prior study but not reported.  No reversible ischemia.  LV Wall Motion:  LVEF 52% with basal to mid inferolateral hypokinesis to akinesis suggesting  scar.  Johnny Nose, MD, [facility] Board Certified in Nuclear Cardiology Attending Cardiologist [facility]  Johnny Nose, MD  07/07/2012 1:23 PM

## 2012-09-29 DIAGNOSIS — M25519 Pain in unspecified shoulder: Secondary | ICD-10-CM | POA: Diagnosis not present

## 2012-10-04 DIAGNOSIS — M25519 Pain in unspecified shoulder: Secondary | ICD-10-CM | POA: Diagnosis not present

## 2012-10-11 DIAGNOSIS — M25519 Pain in unspecified shoulder: Secondary | ICD-10-CM | POA: Diagnosis not present

## 2012-10-18 DIAGNOSIS — M25519 Pain in unspecified shoulder: Secondary | ICD-10-CM | POA: Diagnosis not present

## 2012-10-20 DIAGNOSIS — H52229 Regular astigmatism, unspecified eye: Secondary | ICD-10-CM | POA: Diagnosis not present

## 2012-10-20 DIAGNOSIS — H2510 Age-related nuclear cataract, unspecified eye: Secondary | ICD-10-CM | POA: Diagnosis not present

## 2012-10-20 DIAGNOSIS — H5200 Hypermetropia, unspecified eye: Secondary | ICD-10-CM | POA: Diagnosis not present

## 2012-10-21 DIAGNOSIS — M25519 Pain in unspecified shoulder: Secondary | ICD-10-CM | POA: Diagnosis not present

## 2012-10-25 DIAGNOSIS — M25519 Pain in unspecified shoulder: Secondary | ICD-10-CM | POA: Diagnosis not present

## 2012-10-27 DIAGNOSIS — M25519 Pain in unspecified shoulder: Secondary | ICD-10-CM | POA: Diagnosis not present

## 2012-11-01 DIAGNOSIS — M25519 Pain in unspecified shoulder: Secondary | ICD-10-CM | POA: Diagnosis not present

## 2012-11-08 DIAGNOSIS — M25519 Pain in unspecified shoulder: Secondary | ICD-10-CM | POA: Diagnosis not present

## 2012-11-10 DIAGNOSIS — M25519 Pain in unspecified shoulder: Secondary | ICD-10-CM | POA: Diagnosis not present

## 2012-11-14 DIAGNOSIS — L738 Other specified follicular disorders: Secondary | ICD-10-CM | POA: Diagnosis not present

## 2012-11-14 DIAGNOSIS — L678 Other hair color and hair shaft abnormalities: Secondary | ICD-10-CM | POA: Diagnosis not present

## 2012-11-14 DIAGNOSIS — L570 Actinic keratosis: Secondary | ICD-10-CM | POA: Diagnosis not present

## 2012-11-15 DIAGNOSIS — M25519 Pain in unspecified shoulder: Secondary | ICD-10-CM | POA: Diagnosis not present

## 2012-11-17 DIAGNOSIS — M25519 Pain in unspecified shoulder: Secondary | ICD-10-CM | POA: Diagnosis not present

## 2012-11-22 DIAGNOSIS — M25519 Pain in unspecified shoulder: Secondary | ICD-10-CM | POA: Diagnosis not present

## 2012-11-24 DIAGNOSIS — M25519 Pain in unspecified shoulder: Secondary | ICD-10-CM | POA: Diagnosis not present

## 2012-12-22 DIAGNOSIS — M1710 Unilateral primary osteoarthritis, unspecified knee: Secondary | ICD-10-CM | POA: Diagnosis not present

## 2012-12-22 DIAGNOSIS — IMO0002 Reserved for concepts with insufficient information to code with codable children: Secondary | ICD-10-CM | POA: Diagnosis not present

## 2013-01-31 DIAGNOSIS — Z0000 Encounter for general adult medical examination without abnormal findings: Secondary | ICD-10-CM | POA: Diagnosis not present

## 2013-01-31 DIAGNOSIS — E7800 Pure hypercholesterolemia, unspecified: Secondary | ICD-10-CM | POA: Diagnosis not present

## 2013-01-31 DIAGNOSIS — K219 Gastro-esophageal reflux disease without esophagitis: Secondary | ICD-10-CM | POA: Diagnosis not present

## 2013-01-31 DIAGNOSIS — Z125 Encounter for screening for malignant neoplasm of prostate: Secondary | ICD-10-CM | POA: Diagnosis not present

## 2013-01-31 DIAGNOSIS — I10 Essential (primary) hypertension: Secondary | ICD-10-CM | POA: Diagnosis not present

## 2013-01-31 DIAGNOSIS — Z23 Encounter for immunization: Secondary | ICD-10-CM | POA: Diagnosis not present

## 2013-03-18 DIAGNOSIS — H6120 Impacted cerumen, unspecified ear: Secondary | ICD-10-CM | POA: Diagnosis not present

## 2013-03-29 ENCOUNTER — Ambulatory Visit (INDEPENDENT_AMBULATORY_CARE_PROVIDER_SITE_OTHER): Payer: Medicare Other | Admitting: Cardiovascular Disease

## 2013-03-29 ENCOUNTER — Encounter: Payer: Self-pay | Admitting: Cardiovascular Disease

## 2013-03-29 VITALS — BP 142/80 | HR 58 | Ht 68.5 in | Wt 225.0 lb

## 2013-03-29 DIAGNOSIS — G4733 Obstructive sleep apnea (adult) (pediatric): Secondary | ICD-10-CM

## 2013-03-29 DIAGNOSIS — I10 Essential (primary) hypertension: Secondary | ICD-10-CM

## 2013-03-29 DIAGNOSIS — I739 Peripheral vascular disease, unspecified: Secondary | ICD-10-CM | POA: Insufficient documentation

## 2013-03-29 DIAGNOSIS — I714 Abdominal aortic aneurysm, without rupture, unspecified: Secondary | ICD-10-CM

## 2013-03-29 DIAGNOSIS — E785 Hyperlipidemia, unspecified: Secondary | ICD-10-CM

## 2013-03-29 DIAGNOSIS — I2510 Atherosclerotic heart disease of native coronary artery without angina pectoris: Secondary | ICD-10-CM

## 2013-03-29 NOTE — Assessment & Plan Note (Signed)
status post aortobifemoral bypass grafting by Dr. Hart Rochester in 2005.rule out intra-abdominal ultrasound and lower extremity arterial Doppler studies

## 2013-03-29 NOTE — Patient Instructions (Signed)
  Your physician wants you to follow-up with him in : 1 year with Dr San Morelle will receive a reminder letter in the mail one month in advance. If you don't receive a letter, please call our office to schedule the follow-up appointment.    Your physician has ordered the following tests: lower extremity arterial doppler and abdomnial doppler of your aorta

## 2013-03-29 NOTE — Assessment & Plan Note (Signed)
Controlled on current medications 

## 2013-03-29 NOTE — Assessment & Plan Note (Signed)
Status post myocardial infarction in 1992 followed by coronary bypass grafting by Dr. Tyrone Sage. He denies chest pain or shortness of breath. He had a Myoview stress test performed in March of this year that showed inferolateral scar with preserved ejection fraction.

## 2013-03-29 NOTE — Assessment & Plan Note (Signed)
On statin therapy followed by his PCP 

## 2013-03-29 NOTE — Progress Notes (Signed)
03/29/2013 Johnny [name]   15-May-1945  [id number]  Primary Physician  Johnny Lope, MD Primary Cardiologist: Runell Gess MD Johnny [name]   HPI:  The patient is a 67 year old, mildly overweight, married Caucasian male, father of 2, grandfather to 3 grandchildren who I last saw 6 months ago. He has a history of CAD status post coronary artery bypass grafting in 1992 by Dr. Ofilia Neas. He also had aortobifemoral bypass grafting secondary to abdominal aortic aneurysm done by Dr. Jerilee Field in 2005. His other problems include treated hypertension, dyslipidemia, and obstructive sleep apnea though he no longer wears CPAP. He denies chest pain or shortness of breath. He had an uncomplicated right total knee replacement performed by Dr. Ollen Gross July of last year. Myoview stress test performed March 2012 was nonischemic and carotid Dopplers were normal as well. He had a Myoview stress test performed in March of this year that showed inferolateral scar without ischemia with an EF in the 50% range. He is asymptomatic. His lipid profile followed by his primary care physician.    Current Outpatient Prescriptions  Medication Sig Dispense Refill  . amLODipine (NORVASC) 10 MG tablet Take 10 mg by mouth daily.      Marland Kitchen aspirin 325 MG tablet Take 325 mg by mouth daily.      . enalapril (VASOTEC) 20 MG tablet Take 20 mg by mouth 2 (two) times daily.      . famotidine (PEPCID) 40 MG tablet Take 40 mg by mouth daily.      . simvastatin (ZOCOR) 20 MG tablet Take 20 mg by mouth daily.       No current facility-administered medications for this visit.    Allergies  Allergen Reactions  . Statins Other (See Comments)    Intolerance     History   Social History  . Marital Status: Married    Spouse Name: N/A    Number of Children: N/A  . Years of Education: N/A   Occupational History  . Not on file.   Social History Main Topics  . Smoking status: Former Smoker    Quit date:  08/26/1990  . Smokeless tobacco: Not on file  . Alcohol Use: Yes     Comment: occassional   . Drug Use:   . Sexual Activity:    Other Topics Concern  . Not on file   Social History Narrative  . No narrative on file     Review of Systems: General: negative for chills, fever, night sweats or weight changes.  Cardiovascular: negative for chest pain, dyspnea on exertion, edema, orthopnea, palpitations, paroxysmal nocturnal dyspnea or shortness of breath Dermatological: negative for rash Respiratory: negative for cough or wheezing Urologic: negative for hematuria Abdominal: negative for nausea, vomiting, diarrhea, bright red blood per rectum, melena, or hematemesis Neurologic: negative for visual changes, syncope, or dizziness All other systems reviewed and are otherwise negative except as noted above.    Blood pressure 142/80, pulse 58, height 5' 8.5" (1.74 m), weight 225 lb (102.059 kg).  General appearance: alert and no distress Neck: no adenopathy, no carotid bruit, no JVD, supple, symmetrical, trachea midline and thyroid not enlarged, symmetric, no tenderness/mass/nodules Lungs: clear to auscultation bilaterally Heart: regular rate and rhythm, S1, S2 normal, no murmur, click, rub or gallop Extremities: extremities normal, atraumatic, no cyanosis or edema  EKG sinus bradycardia at 58 with nonspecific ST and T-wave changes and a RV conduction delay with RSR prime in leads V1 and V2  ASSESSMENT AND PLAN:   Peripheral arterial disease status post aortobifemoral bypass grafting by Dr. Hart Rochester in 2005.rule out intra-abdominal ultrasound and lower extremity arterial Doppler studies  Coronary artery disease Status post myocardial infarction in 1992 followed by coronary bypass grafting by Dr. Tyrone Sage. He denies chest pain or shortness of breath. He had a Myoview stress test performed in March of this year that showed inferolateral scar with preserved ejection fraction.  Essential  hypertension Controlled on current medications  Hyperlipidemia On statin therapy followed by his PCP      Runell Gess MD [facility], [facility] 03/29/2013 11:19 AM

## 2013-03-30 ENCOUNTER — Encounter: Payer: Self-pay | Admitting: Cardiovascular Disease

## 2013-05-03 ENCOUNTER — Ambulatory Visit (HOSPITAL_COMMUNITY)
Admission: RE | Admit: 2013-05-03 | Discharge: 2013-05-03 | Disposition: A | Discharge status: Home / Self Care | Payer: Medicare Other | Source: Ambulatory Visit | Attending: Internal Medicine | Admitting: Internal Medicine

## 2013-05-03 DIAGNOSIS — I70219 Atherosclerosis of native arteries of extremities with intermittent claudication, unspecified extremity: Secondary | ICD-10-CM | POA: Insufficient documentation

## 2013-05-03 DIAGNOSIS — I739 Peripheral vascular disease, unspecified: Secondary | ICD-10-CM

## 2013-05-03 NOTE — Progress Notes (Signed)
Arterial Lower Ext. Duplex Completed. Oliver Heitzenrater, BS, RDMS, RVT  

## 2013-05-12 ENCOUNTER — Inpatient Hospital Stay (HOSPITAL_COMMUNITY): Admission: RE | Admit: 2013-05-12 | Payer: Medicare Other | Source: Ambulatory Visit

## 2013-06-13 ENCOUNTER — Ambulatory Visit: Payer: Medicare Other | Admitting: Cardiovascular Disease

## 2013-06-28 IMAGING — CR DG CHEST 2V
2 series · 2 of 2 positions shown · non-contrast
Comparison: 02/22/2004

CLINICAL DATA: Preoperative respiratory examination for knee
surgery.

CHEST - 2 VIEW

[w chest pa]
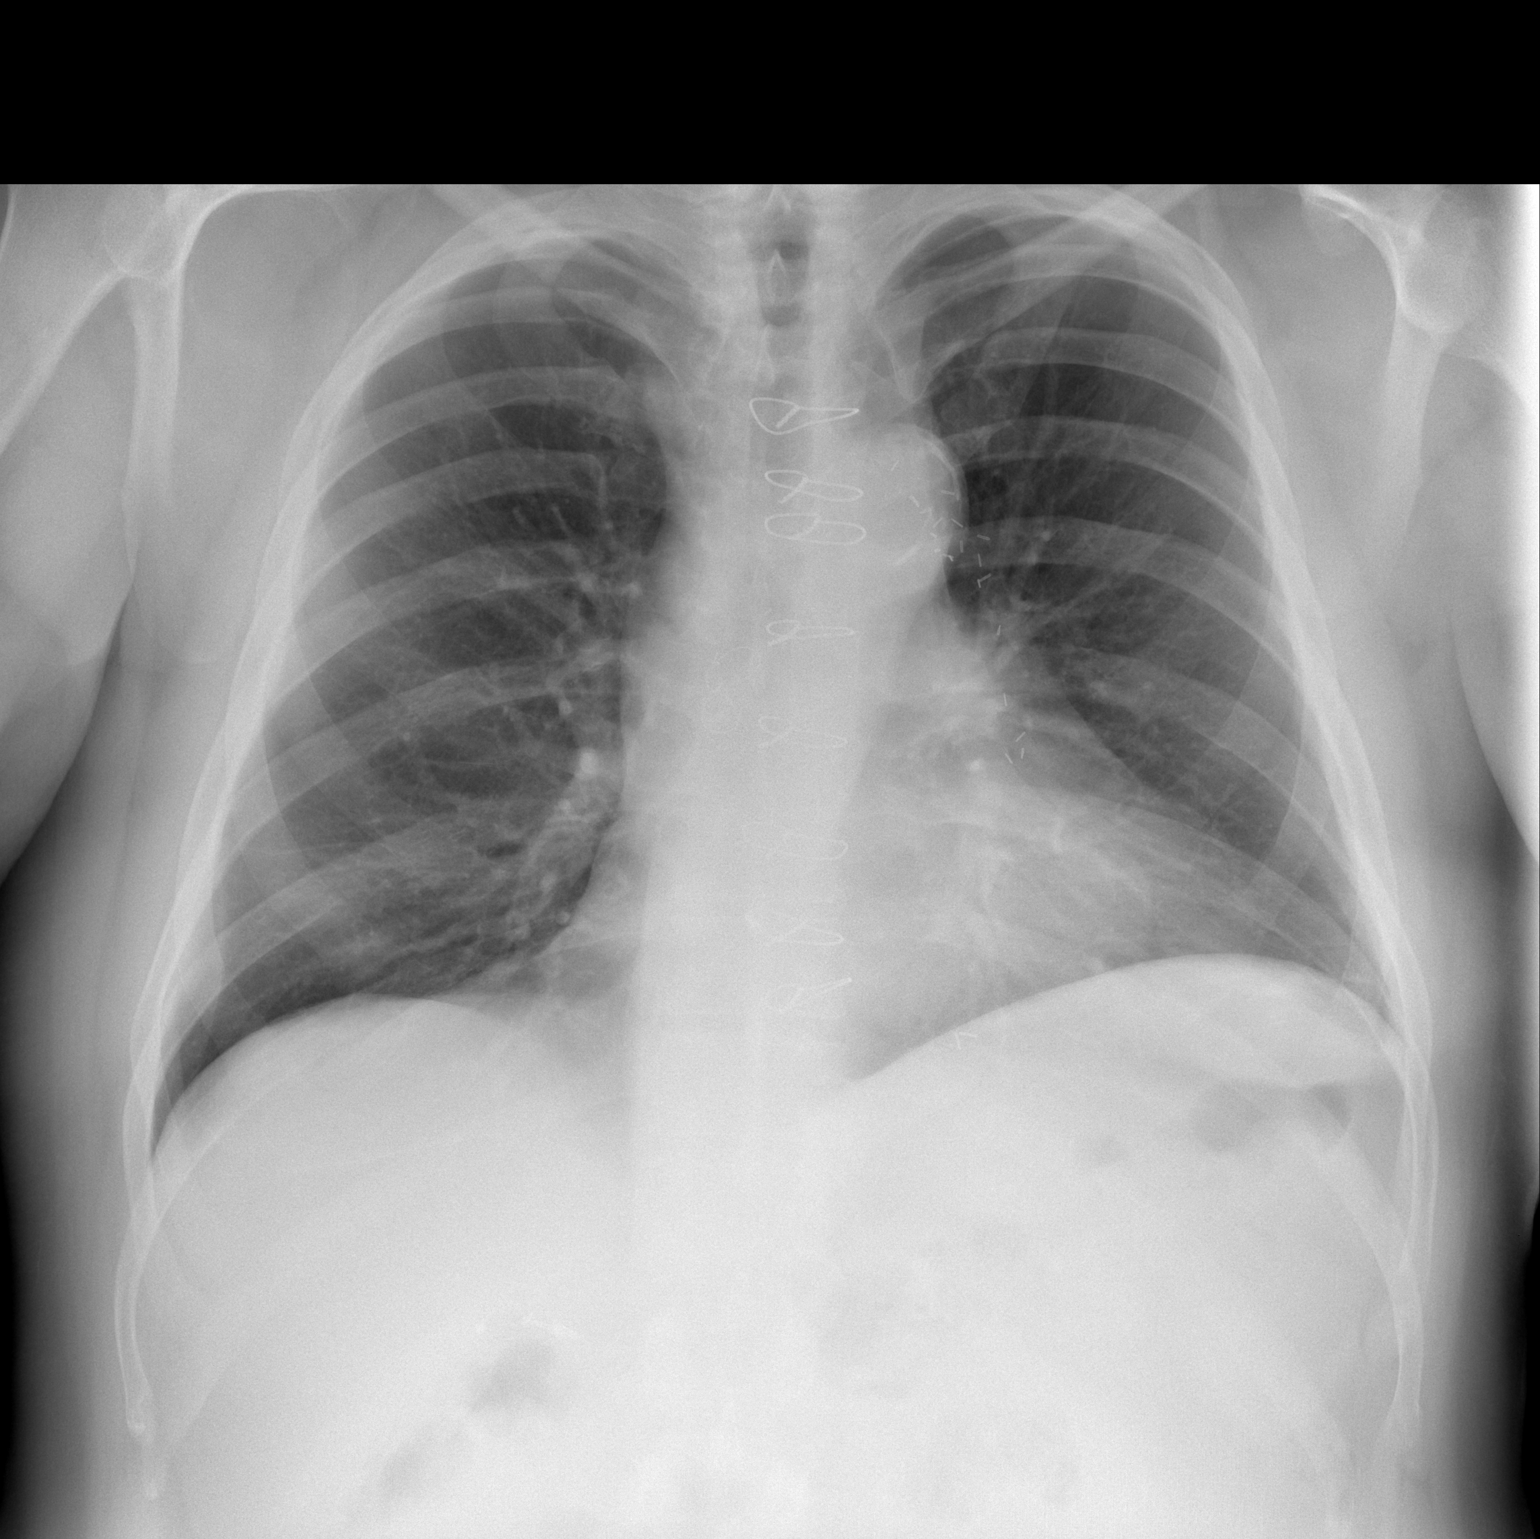

[w chest lat]
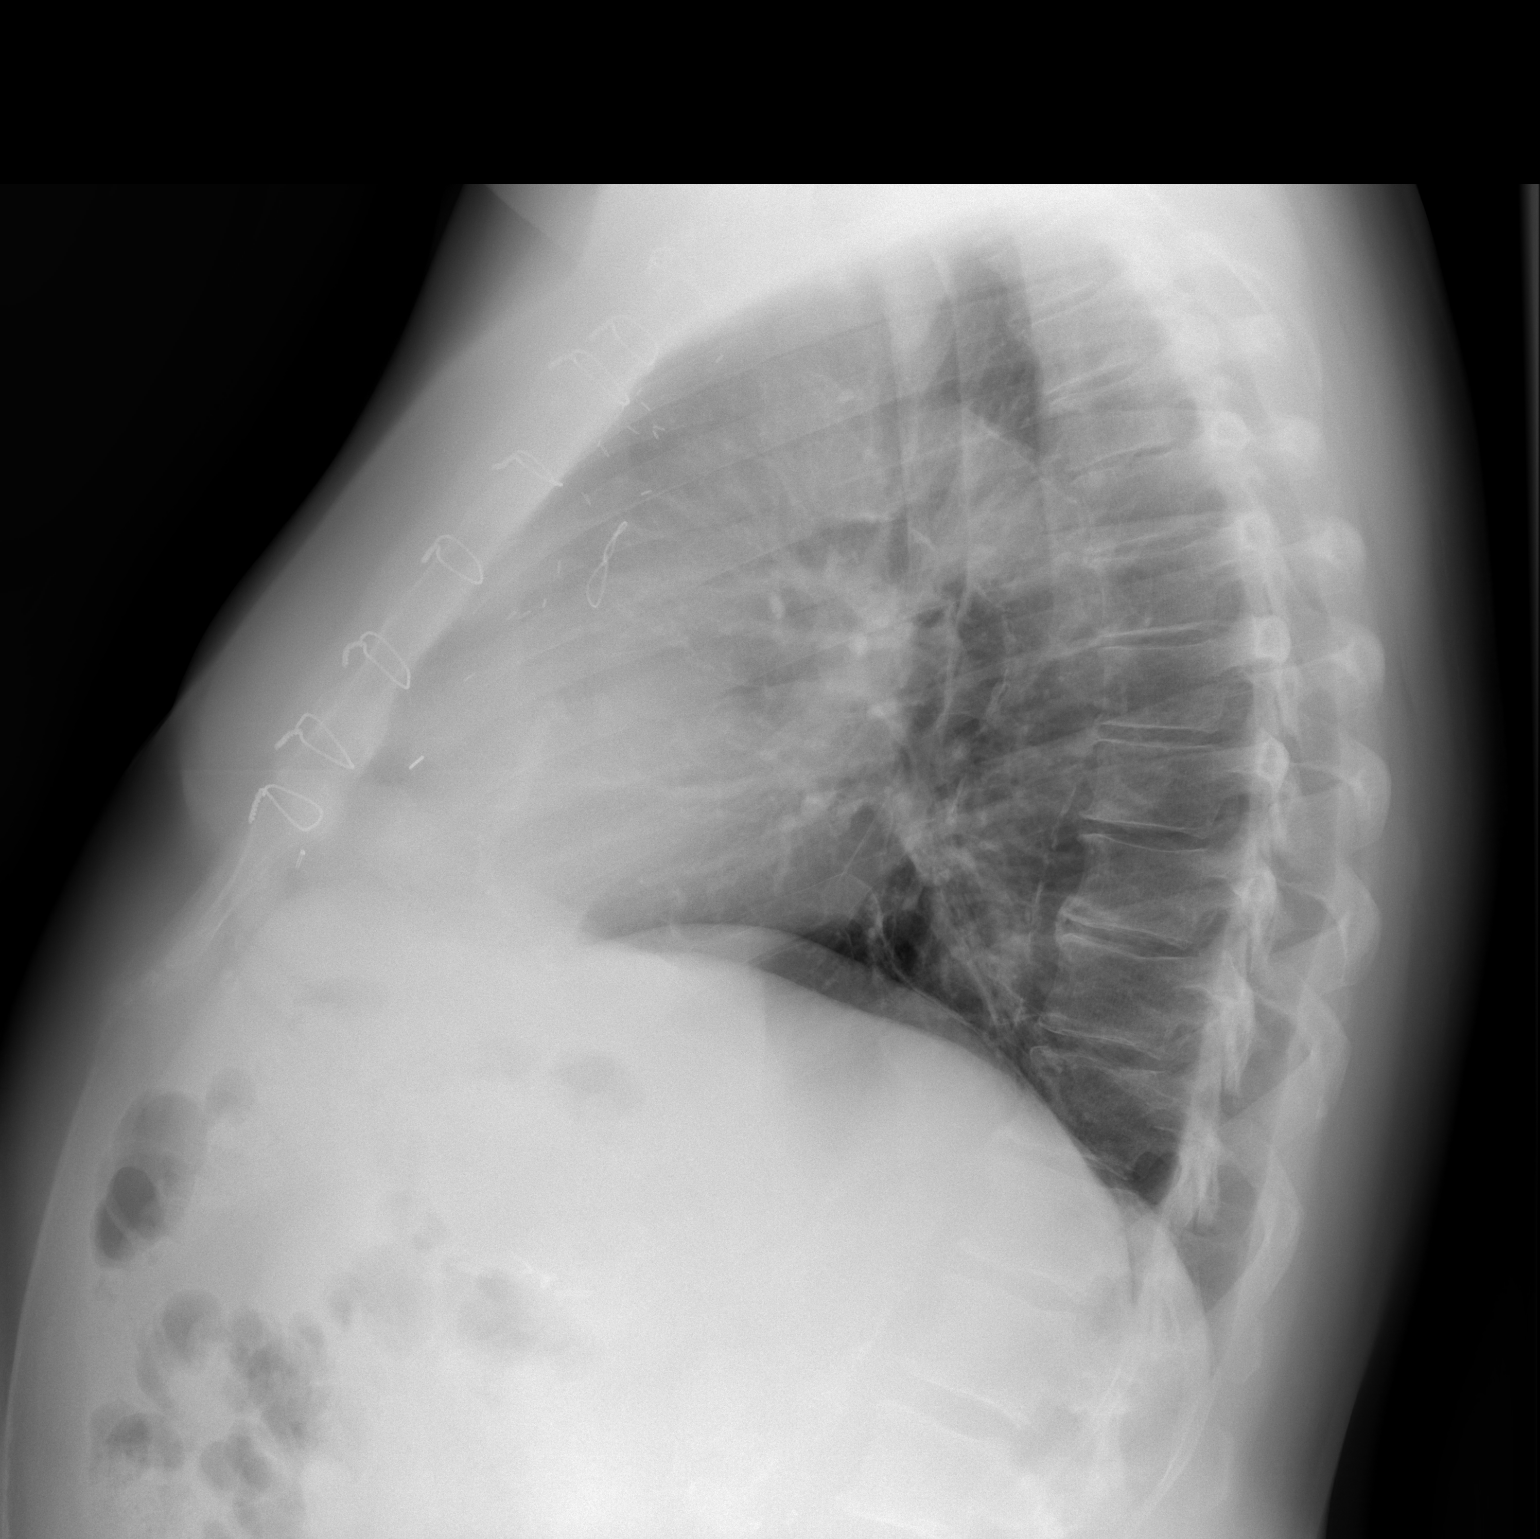

[2 of 2 positions shown; findings below may reference images not displayed]

FINDINGS: Cardiomegaly and CABG changes are identified.
Mild elevation of the left hemidiaphragm is again noted.
There is no evidence of focal airspace disease, pulmonary edema,
pulmonary nodule/mass, pleural effusion, or pneumothorax.
No acute bony abnormalities are identified.
IMPRESSION: Cardiomegaly without evidence of active cardiopulmonary disease.

## 2013-07-19 ENCOUNTER — Encounter: Payer: Self-pay | Admitting: Cardiovascular Disease

## 2013-07-19 ENCOUNTER — Ambulatory Visit (INDEPENDENT_AMBULATORY_CARE_PROVIDER_SITE_OTHER): Payer: Medicare Other | Admitting: Cardiovascular Disease

## 2013-07-19 VITALS — BP 138/80 | HR 62 | Ht 68.0 in | Wt 224.1 lb

## 2013-07-19 DIAGNOSIS — I739 Peripheral vascular disease, unspecified: Secondary | ICD-10-CM | POA: Diagnosis not present

## 2013-07-19 DIAGNOSIS — I2510 Atherosclerotic heart disease of native coronary artery without angina pectoris: Secondary | ICD-10-CM | POA: Diagnosis not present

## 2013-07-19 DIAGNOSIS — E785 Hyperlipidemia, unspecified: Secondary | ICD-10-CM

## 2013-07-19 DIAGNOSIS — I10 Essential (primary) hypertension: Secondary | ICD-10-CM

## 2013-07-19 NOTE — Progress Notes (Signed)
07/19/2013 Johnny [name]   [birth year]  [id number]  Primary Physician  Melinda Crutch, MD Primary Cardiologist: Lorretta Harp MD Renae Gloss   HPI:  The patient is a 68 year old, mildly overweight, married Caucasian male, father of 2, grandfather to 3 grandchildren who I last saw 6 months ago. He has a history of CAD status post coronary artery bypass grafting in 1992 by Dr. Ceasar Mons. He also had aortobifemoral bypass grafting secondary to abdominal aortic aneurysm done by Dr. Mamie Nick in 2005. His other problems include treated hypertension, dyslipidemia, and obstructive sleep apnea though he no longer wears CPAP. He denies chest pain or shortness of breath. He had an uncomplicated right total knee replacement performed by Dr. Gaynelle Arabian July of last year. Myoview stress test performed March 2012 was nonischemic and carotid Dopplers were normal as well. He had a Myoview stress test performed in March of this year that showed inferolateral scar without ischemia with an EF in the 50% range. He is asymptomatic. His lipid profile followed by his primary care physician. Because of mild lifestyle limiting claudication lower x-ray Dopplers were performed in our office on 05/03/13 which revealed a patent aortobifemoral bypass graft, severe SFA disease bilaterally with ABIs in the 0.87 range.    Current Outpatient Prescriptions  Medication Sig Dispense Refill  . amLODipine (NORVASC) 10 MG tablet Take 10 mg by mouth daily.      Marland Kitchen aspirin 325 MG tablet Take 325 mg by mouth daily.      . enalapril (VASOTEC) 20 MG tablet Take 20 mg by mouth 2 (two) times daily.      . famotidine (PEPCID) 40 MG tablet Take 40 mg by mouth daily.      . simvastatin (ZOCOR) 20 MG tablet Take 20 mg by mouth daily.       No current facility-administered medications for this visit.    Allergies  Allergen Reactions  . Statins Other (See Comments)    Intolerance     History   Social History  .  Marital Status: Married    Spouse Name: N/A    Number of Children: N/A  . Years of Education: N/A   Occupational History  . Not on file.   Social History Main Topics  . Smoking status: Former Smoker    Quit date: 08/26/1990  . Smokeless tobacco: Not on file  . Alcohol Use: Yes     Comment: occassional   . Drug Use:   . Sexual Activity:    Other Topics Concern  . Not on file   Social History Narrative  . No narrative on file     Review of Systems: General: negative for chills, fever, night sweats or weight changes.  Cardiovascular: negative for chest pain, dyspnea on exertion, edema, orthopnea, palpitations, paroxysmal nocturnal dyspnea or shortness of breath Dermatological: negative for rash Respiratory: negative for cough or wheezing Urologic: negative for hematuria Abdominal: negative for nausea, vomiting, diarrhea, bright red blood per rectum, melena, or hematemesis Neurologic: negative for visual changes, syncope, or dizziness All other systems reviewed and are otherwise negative except as noted above.    Blood pressure 138/80, pulse 62, height 5\' 8"  (1.727 m), weight 224 lb 1.6 oz (101.651 kg).  General appearance: alert and no distress Neck: no adenopathy, no carotid bruit, no JVD, supple, symmetrical, trachea midline and thyroid not enlarged, symmetric, no tenderness/mass/nodules Lungs: clear to auscultation bilaterally Heart: regular rate and rhythm, S1, S2 normal, no murmur, click, rub  or gallop Extremities: extremities normal, atraumatic, no cyanosis or edema and diminished pedal pulses bilaterally  EKG not performed today  ASSESSMENT AND PLAN:   Peripheral arterial disease Status post aortobifemoral bypass grafting in 2005 by Dr. Kellie Simmering. He does complain of mild lifestyle limiting claudication. Lower extremity arterial Doppler studies performed in our office 05/03/13 revealed a patent aortobifemoral bypass graft, occluded right SFA with severe plaquing in the  left SFA and potentially small area of occlusion. He had three-vessel runoff below the knee bilaterally with ABIs of 0.87. Given his relative lack of symptoms I decided to treat him conservatively and have recommended that he see a podiatrist for any foot care.  Hyperlipidemia On statin therapy followed by his PCP  Essential hypertension Well-controlled on current medications  Coronary artery disease Status post coronary artery bypass grafting by Dr. Servando Snare in 1992. He is otherwise asymptomatic. A Myoview stress test performed in our office 07/07/12 was low risk without ischemia.      Lorretta Harp MD FACP,FACC,FAHA, [facility] 07/19/2013 10:02 AM

## 2013-07-19 NOTE — Patient Instructions (Signed)
Your physician wants you to follow-up in: 1 year with Dr Gwenlyn Found. You will receive a reminder letter in the mail two months in advance. If you don't receive a letter, please call our office to schedule the follow-up appointment.  Dr Ila Mcgill [place]

## 2013-07-19 NOTE — Assessment & Plan Note (Signed)
Status post aortobifemoral bypass grafting in 2005 by Dr. Kellie Simmering. He does complain of mild lifestyle limiting claudication. Lower extremity arterial Doppler studies performed in our office 05/03/13 revealed a patent aortobifemoral bypass graft, occluded right SFA with severe plaquing in the left SFA and potentially small area of occlusion. He had three-vessel runoff below the knee bilaterally with ABIs of 0.87. Given his relative lack of symptoms I decided to treat him conservatively and have recommended that he see a podiatrist for any foot care.

## 2013-07-19 NOTE — Assessment & Plan Note (Signed)
Status post coronary artery bypass grafting by Dr. Servando Snare in 1992. He is otherwise asymptomatic. A Myoview stress test performed in our office 07/07/12 was low risk without ischemia.

## 2013-07-19 NOTE — Assessment & Plan Note (Signed)
Well-controlled on current medications 

## 2013-07-19 NOTE — Assessment & Plan Note (Signed)
On statin therapy followed by his PCP 

## 2013-09-11 ENCOUNTER — Ambulatory Visit: Payer: Self-pay | Admitting: Podiatry

## 2013-09-20 ENCOUNTER — Ambulatory Visit (INDEPENDENT_AMBULATORY_CARE_PROVIDER_SITE_OTHER): Payer: Medicare Other | Admitting: Podiatry

## 2013-09-20 ENCOUNTER — Encounter: Payer: Self-pay | Admitting: Podiatry

## 2013-09-20 VITALS — BP 132/72 | HR 56 | Resp 16

## 2013-09-20 DIAGNOSIS — I2510 Atherosclerotic heart disease of native coronary artery without angina pectoris: Secondary | ICD-10-CM | POA: Diagnosis not present

## 2013-09-20 DIAGNOSIS — M79609 Pain in unspecified limb: Secondary | ICD-10-CM

## 2013-09-20 DIAGNOSIS — B351 Tinea unguium: Secondary | ICD-10-CM

## 2013-09-20 NOTE — Progress Notes (Signed)
   Subjective:    Patient ID: Johnny [name], male    DOB: [birth year], 68 y.o.   MRN: [id number]  HPI Comments: Dr berry said i needed to come here for my toenails. They do not hurt. i trim my own toenails.      Review of Systems  Cardiovascular:       Calf pain when walking  All other systems reviewed and are negative.      Objective:   Physical Exam        Assessment & Plan:

## 2013-09-20 NOTE — Progress Notes (Signed)
Subjective:     Patient ID: Johnny [name], male   DOB: Oct 30, 1945, 68 y.o.   MRN: [id number]  HPI patient presents stating he was referred by Dr. Alvester Chou that he gets ingrown on his big toes and he has poor circulation he tries to cut them and they get tender   Review of Systems  All other systems reviewed and are negative.      Objective:   Physical Exam  Nursing note and vitals reviewed. Constitutional: He is oriented to person, place, and time.  Cardiovascular: Intact distal pulses.   Musculoskeletal: Normal range of motion.  Neurological: He is oriented to person, place, and time.  Skin: Skin is warm.   neurovascular status diminished both feet with diminished hair growth and feet that are cool but are being monitored by Dr. Alvester Chou with no problems with his everyday life. 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 This can be done as needed

## 2013-10-17 DIAGNOSIS — R05 Cough: Secondary | ICD-10-CM | POA: Diagnosis not present

## 2013-10-17 DIAGNOSIS — R059 Cough, unspecified: Secondary | ICD-10-CM | POA: Diagnosis not present

## 2013-11-06 DIAGNOSIS — H52229 Regular astigmatism, unspecified eye: Secondary | ICD-10-CM | POA: Diagnosis not present

## 2013-11-06 DIAGNOSIS — E7800 Pure hypercholesterolemia, unspecified: Secondary | ICD-10-CM | POA: Diagnosis not present

## 2013-11-06 DIAGNOSIS — H5200 Hypermetropia, unspecified eye: Secondary | ICD-10-CM | POA: Diagnosis not present

## 2013-11-06 DIAGNOSIS — H2510 Age-related nuclear cataract, unspecified eye: Secondary | ICD-10-CM | POA: Diagnosis not present

## 2014-02-28 DIAGNOSIS — D485 Neoplasm of uncertain behavior of skin: Secondary | ICD-10-CM | POA: Diagnosis not present

## 2014-02-28 DIAGNOSIS — D1801 Hemangioma of skin and subcutaneous tissue: Secondary | ICD-10-CM | POA: Diagnosis not present

## 2014-03-02 DIAGNOSIS — L309 Dermatitis, unspecified: Secondary | ICD-10-CM | POA: Diagnosis not present

## 2014-03-02 DIAGNOSIS — T798XXA Other early complications of trauma, initial encounter: Secondary | ICD-10-CM | POA: Diagnosis not present

## 2014-03-02 DIAGNOSIS — B958 Unspecified staphylococcus as the cause of diseases classified elsewhere: Secondary | ICD-10-CM | POA: Diagnosis not present

## 2014-03-12 DIAGNOSIS — Z125 Encounter for screening for malignant neoplasm of prostate: Secondary | ICD-10-CM | POA: Diagnosis not present

## 2014-03-12 DIAGNOSIS — E780 Pure hypercholesterolemia: Secondary | ICD-10-CM | POA: Diagnosis not present

## 2014-03-12 DIAGNOSIS — I10 Essential (primary) hypertension: Secondary | ICD-10-CM | POA: Diagnosis not present

## 2014-03-12 DIAGNOSIS — Z713 Dietary counseling and surveillance: Secondary | ICD-10-CM | POA: Diagnosis not present

## 2014-03-12 DIAGNOSIS — R739 Hyperglycemia, unspecified: Secondary | ICD-10-CM | POA: Diagnosis not present

## 2014-03-12 DIAGNOSIS — Z0000 Encounter for general adult medical examination without abnormal findings: Secondary | ICD-10-CM | POA: Diagnosis not present

## 2014-03-12 DIAGNOSIS — I2510 Atherosclerotic heart disease of native coronary artery without angina pectoris: Secondary | ICD-10-CM | POA: Diagnosis not present

## 2014-03-12 DIAGNOSIS — E669 Obesity, unspecified: Secondary | ICD-10-CM | POA: Diagnosis not present

## 2014-03-12 DIAGNOSIS — K219 Gastro-esophageal reflux disease without esophagitis: Secondary | ICD-10-CM | POA: Diagnosis not present

## 2014-04-17 DIAGNOSIS — R109 Unspecified abdominal pain: Secondary | ICD-10-CM | POA: Diagnosis not present

## 2014-06-12 DIAGNOSIS — E669 Obesity, unspecified: Secondary | ICD-10-CM | POA: Diagnosis not present

## 2014-06-12 DIAGNOSIS — I10 Essential (primary) hypertension: Secondary | ICD-10-CM | POA: Diagnosis not present

## 2014-06-12 DIAGNOSIS — I2510 Atherosclerotic heart disease of native coronary artery without angina pectoris: Secondary | ICD-10-CM | POA: Diagnosis not present

## 2014-06-12 DIAGNOSIS — K219 Gastro-esophageal reflux disease without esophagitis: Secondary | ICD-10-CM | POA: Diagnosis not present

## 2014-06-12 DIAGNOSIS — R7309 Other abnormal glucose: Secondary | ICD-10-CM | POA: Diagnosis not present

## 2014-06-12 DIAGNOSIS — E780 Pure hypercholesterolemia: Secondary | ICD-10-CM | POA: Diagnosis not present

## 2014-07-20 ENCOUNTER — Ambulatory Visit (INDEPENDENT_AMBULATORY_CARE_PROVIDER_SITE_OTHER): Payer: Medicare Other | Admitting: Cardiovascular Disease

## 2014-07-20 ENCOUNTER — Encounter: Payer: Self-pay | Admitting: Cardiovascular Disease

## 2014-07-20 VITALS — BP 150/70 | HR 62 | Ht 68.0 in | Wt 215.2 lb

## 2014-07-20 DIAGNOSIS — I2510 Atherosclerotic heart disease of native coronary artery without angina pectoris: Secondary | ICD-10-CM

## 2014-07-20 DIAGNOSIS — I739 Peripheral vascular disease, unspecified: Secondary | ICD-10-CM | POA: Diagnosis not present

## 2014-07-20 DIAGNOSIS — I2583 Coronary atherosclerosis due to lipid rich plaque: Secondary | ICD-10-CM

## 2014-07-20 DIAGNOSIS — I10 Essential (primary) hypertension: Secondary | ICD-10-CM | POA: Diagnosis not present

## 2014-07-20 DIAGNOSIS — E785 Hyperlipidemia, unspecified: Secondary | ICD-10-CM | POA: Diagnosis not present

## 2014-07-20 NOTE — Assessment & Plan Note (Signed)
History of hypertension with blood pressure measured at 150/70. He is on amlodipine and enalapril. He has changed his diet significantly. Continue current meds at current dosing

## 2014-07-20 NOTE — Patient Instructions (Signed)
Your physician wants you to follow-up in: 1 year with Dr Berry. You will receive a reminder letter in the mail two months in advance. If you don't receive a letter, please call our office to schedule the follow-up appointment.  

## 2014-07-20 NOTE — Assessment & Plan Note (Signed)
History of peripheral arterial disease status post aorto bifemoral bypass grafting in 2005 by Dr. Kellie Simmering. He was complaining of claudication in lower extremity arterial Doppler studies performed in our office showed ABIs of 0.87 bilaterally with patent limbs of his aortobifem and occluded SFAs.

## 2014-07-20 NOTE — Progress Notes (Signed)
07/20/2014 Johnny [name]   03/24/1946  [id number]  Primary Physician  Melinda Crutch, MD Primary Cardiologist: Lorretta Harp MD Renae Gloss   HPI:   The patient is a 69 year old, mildly overweight, married Caucasian male, father of 2, grandfather to 3 grandchildren who I last saw 12 months ago. He has a history of CAD status post coronary artery bypass grafting in 1992 by Dr. Ceasar Mons. He also had aortobifemoral bypass grafting secondary to abdominal aortic aneurysm done by Dr. Mamie Nick in 2005. His other problems include treated hypertension, dyslipidemia, and obstructive sleep apnea though he no longer wears CPAP. He denies chest pain or shortness of breath. He had an uncomplicated right total knee replacement performed by Dr. Gaynelle Arabian July of last year. Myoview stress test performed March 2012 was nonischemic and carotid Dopplers were normal as well. He had a Myoview stress test performed in March of this year that showed inferolateral scar without ischemia with an EF in the 50% range. He is asymptomatic. His lipid profile followed by his primary care physician. Because of mild lifestyle limiting claudication lower extremity arterial Dopplers were performed in our office on 05/03/13 which revealed a patent aortobifemoral bypass graft, severe SFA disease bilaterally with ABIs in the 0.87 range. Since I saw him back one year ago he has remained chronically stable. He still has mild claudication. He has changed his diet dramatically after reading several books recommended by his primary care doctor including Eat To Live  by Dr. Baker Janus. He denies chest pain or shortness of breath   Current Outpatient Prescriptions  Medication Sig Dispense Refill  . amLODipine (NORVASC) 10 MG tablet Take 10 mg by mouth daily.    Marland Kitchen aspirin 325 MG tablet Take 325 mg by mouth daily.    . enalapril (VASOTEC) 20 MG tablet Take 20 mg by mouth 2 (two) times daily.    . famotidine (PEPCID) 40  MG tablet Take 40 mg by mouth daily.    . simvastatin (ZOCOR) 20 MG tablet Take 20 mg by mouth daily.     No current facility-administered medications for this visit.    Allergies  Allergen Reactions  . Statins Other (See Comments)    Intolerance     History   Social History  . Marital Status: Married    Spouse Name: N/A  . Number of Children: N/A  . Years of Education: N/A   Occupational History  . Not on file.   Social History Main Topics  . Smoking status: Former Smoker    Quit date: 08/26/1990  . Smokeless tobacco: Not on file  . Alcohol Use: Yes     Comment: occassional   . Drug Use: Not on file  . Sexual Activity: Not on file   Other Topics Concern  . Not on file   Social History Narrative     Review of Systems: General: negative for chills, fever, night sweats or weight changes.  Cardiovascular: negative for chest pain, dyspnea on exertion, edema, orthopnea, palpitations, paroxysmal nocturnal dyspnea or shortness of breath Dermatological: negative for rash Respiratory: negative for cough or wheezing Urologic: negative for hematuria Abdominal: negative for nausea, vomiting, diarrhea, bright red blood per rectum, melena, or hematemesis Neurologic: negative for visual changes, syncope, or dizziness All other systems reviewed and are otherwise negative except as noted above.    Blood pressure 150/70, pulse 62, height 5\' 8"  (1.727 m), weight 215 lb 3.2 oz (97.614 kg).  General appearance: alert  and no distress Neck: no adenopathy, no JVD, supple, symmetrical, trachea midline, thyroid not enlarged, symmetric, no tenderness/mass/nodules and soft left carotid bruit Lungs: clear to auscultation bilaterally Heart: soft outflow tract murmur Extremities: extremities normal, atraumatic, no cyanosis or edema  EKG normal sinus rhythm at 62 with nonspecific ST and T-wave changes. I personally reviewed this EKG  ASSESSMENT AND PLAN:   Peripheral arterial  disease History of peripheral arterial disease status post aorto bifemoral bypass grafting in 2005 by Dr. Kellie Simmering. He was complaining of claudication in lower extremity arterial Doppler studies performed in our office showed ABIs of 0.87 bilaterally with patent limbs of his aortobifem and occluded SFAs.   Hyperlipidemia History of hyperlipidemia on simvastatin 20 mg a day followed by his PCP   Essential hypertension History of hypertension with blood pressure measured at 150/70. He is on amlodipine and enalapril. He has changed his diet significantly. Continue current meds at current dosing   Coronary artery disease History of coronary artery disease status post coronary artery bypass grafting in 1992 by Dr. Darius Bump. He had a Myoview stress test performed 07/07/12 which was low risk with inferolateral scar and an ejection fraction of 52%. He has not had chronic catheterization since his bypass graft operation. He denies chest pain or shortness of breath.       Lorretta Harp MD FACP,FACC,FAHA, [facility] 07/20/2014 9:58 AM

## 2014-07-20 NOTE — Assessment & Plan Note (Signed)
History of hyperlipidemia on simvastatin 20 mg a day followed by his PCP 

## 2014-07-20 NOTE — Assessment & Plan Note (Signed)
History of coronary artery disease status post coronary artery bypass grafting in 1992 by Dr. Darius Bump. He had a Myoview stress test performed 07/07/12 which was low risk with inferolateral scar and an ejection fraction of 52%. He has not had chronic catheterization since his bypass graft operation. He denies chest pain or shortness of breath.

## 2014-09-12 DIAGNOSIS — H6123 Impacted cerumen, bilateral: Secondary | ICD-10-CM | POA: Diagnosis not present

## 2014-10-10 DIAGNOSIS — I2510 Atherosclerotic heart disease of native coronary artery without angina pectoris: Secondary | ICD-10-CM | POA: Diagnosis not present

## 2014-10-10 DIAGNOSIS — E780 Pure hypercholesterolemia: Secondary | ICD-10-CM | POA: Diagnosis not present

## 2014-10-10 DIAGNOSIS — I10 Essential (primary) hypertension: Secondary | ICD-10-CM | POA: Diagnosis not present

## 2014-10-10 DIAGNOSIS — R7309 Other abnormal glucose: Secondary | ICD-10-CM | POA: Diagnosis not present

## 2014-10-10 DIAGNOSIS — E669 Obesity, unspecified: Secondary | ICD-10-CM | POA: Diagnosis not present

## 2014-10-10 DIAGNOSIS — K219 Gastro-esophageal reflux disease without esophagitis: Secondary | ICD-10-CM | POA: Diagnosis not present

## 2014-12-18 DIAGNOSIS — H2513 Age-related nuclear cataract, bilateral: Secondary | ICD-10-CM | POA: Diagnosis not present

## 2014-12-18 DIAGNOSIS — E780 Pure hypercholesterolemia: Secondary | ICD-10-CM | POA: Diagnosis not present

## 2014-12-18 DIAGNOSIS — H25031 Anterior subcapsular polar age-related cataract, right eye: Secondary | ICD-10-CM | POA: Diagnosis not present

## 2014-12-18 DIAGNOSIS — H5201 Hypermetropia, right eye: Secondary | ICD-10-CM | POA: Diagnosis not present

## 2015-01-11 DIAGNOSIS — J309 Allergic rhinitis, unspecified: Secondary | ICD-10-CM | POA: Diagnosis not present

## 2015-01-11 DIAGNOSIS — J320 Chronic maxillary sinusitis: Secondary | ICD-10-CM | POA: Diagnosis not present

## 2015-01-11 DIAGNOSIS — H1010 Acute atopic conjunctivitis, unspecified eye: Secondary | ICD-10-CM | POA: Diagnosis not present

## 2015-01-22 DIAGNOSIS — H2513 Age-related nuclear cataract, bilateral: Secondary | ICD-10-CM | POA: Diagnosis not present

## 2015-01-22 DIAGNOSIS — H01001 Unspecified blepharitis right upper eyelid: Secondary | ICD-10-CM | POA: Diagnosis not present

## 2015-01-22 DIAGNOSIS — H25013 Cortical age-related cataract, bilateral: Secondary | ICD-10-CM | POA: Diagnosis not present

## 2015-01-22 DIAGNOSIS — H40013 Open angle with borderline findings, low risk, bilateral: Secondary | ICD-10-CM | POA: Diagnosis not present

## 2015-01-28 DIAGNOSIS — J209 Acute bronchitis, unspecified: Secondary | ICD-10-CM | POA: Diagnosis not present

## 2015-01-28 DIAGNOSIS — J329 Chronic sinusitis, unspecified: Secondary | ICD-10-CM | POA: Diagnosis not present

## 2015-02-12 DIAGNOSIS — E669 Obesity, unspecified: Secondary | ICD-10-CM | POA: Diagnosis not present

## 2015-02-12 DIAGNOSIS — I10 Essential (primary) hypertension: Secondary | ICD-10-CM | POA: Diagnosis not present

## 2015-02-12 DIAGNOSIS — K219 Gastro-esophageal reflux disease without esophagitis: Secondary | ICD-10-CM | POA: Diagnosis not present

## 2015-02-12 DIAGNOSIS — E7800 Pure hypercholesterolemia, unspecified: Secondary | ICD-10-CM | POA: Diagnosis not present

## 2015-02-12 DIAGNOSIS — Z96651 Presence of right artificial knee joint: Secondary | ICD-10-CM | POA: Diagnosis not present

## 2015-02-12 DIAGNOSIS — I2510 Atherosclerotic heart disease of native coronary artery without angina pectoris: Secondary | ICD-10-CM | POA: Diagnosis not present

## 2015-02-12 DIAGNOSIS — Z23 Encounter for immunization: Secondary | ICD-10-CM | POA: Diagnosis not present

## 2015-02-12 DIAGNOSIS — R7309 Other abnormal glucose: Secondary | ICD-10-CM | POA: Diagnosis not present

## 2015-02-26 DIAGNOSIS — H401122 Primary open-angle glaucoma, left eye, moderate stage: Secondary | ICD-10-CM | POA: Diagnosis not present

## 2015-04-03 DIAGNOSIS — E7800 Pure hypercholesterolemia, unspecified: Secondary | ICD-10-CM | POA: Diagnosis not present

## 2015-04-03 DIAGNOSIS — Z125 Encounter for screening for malignant neoplasm of prostate: Secondary | ICD-10-CM | POA: Diagnosis not present

## 2015-04-03 DIAGNOSIS — Z23 Encounter for immunization: Secondary | ICD-10-CM | POA: Diagnosis not present

## 2015-04-03 DIAGNOSIS — I10 Essential (primary) hypertension: Secondary | ICD-10-CM | POA: Diagnosis not present

## 2015-04-03 DIAGNOSIS — K219 Gastro-esophageal reflux disease without esophagitis: Secondary | ICD-10-CM | POA: Diagnosis not present

## 2015-04-03 DIAGNOSIS — R05 Cough: Secondary | ICD-10-CM | POA: Diagnosis not present

## 2015-04-03 DIAGNOSIS — Z0000 Encounter for general adult medical examination without abnormal findings: Secondary | ICD-10-CM | POA: Diagnosis not present

## 2015-04-03 DIAGNOSIS — J309 Allergic rhinitis, unspecified: Secondary | ICD-10-CM | POA: Diagnosis not present

## 2015-04-03 DIAGNOSIS — G4733 Obstructive sleep apnea (adult) (pediatric): Secondary | ICD-10-CM | POA: Diagnosis not present

## 2015-04-03 DIAGNOSIS — R7309 Other abnormal glucose: Secondary | ICD-10-CM | POA: Diagnosis not present

## 2015-04-03 DIAGNOSIS — R7303 Prediabetes: Secondary | ICD-10-CM | POA: Diagnosis not present

## 2015-04-03 DIAGNOSIS — I739 Peripheral vascular disease, unspecified: Secondary | ICD-10-CM | POA: Diagnosis not present

## 2015-04-16 DIAGNOSIS — H401122 Primary open-angle glaucoma, left eye, moderate stage: Secondary | ICD-10-CM | POA: Diagnosis not present

## 2015-06-04 DIAGNOSIS — G4733 Obstructive sleep apnea (adult) (pediatric): Secondary | ICD-10-CM | POA: Diagnosis not present

## 2015-06-20 DIAGNOSIS — G4733 Obstructive sleep apnea (adult) (pediatric): Secondary | ICD-10-CM | POA: Diagnosis not present

## 2015-08-13 DIAGNOSIS — H01004 Unspecified blepharitis left upper eyelid: Secondary | ICD-10-CM | POA: Diagnosis not present

## 2015-08-13 DIAGNOSIS — H01002 Unspecified blepharitis right lower eyelid: Secondary | ICD-10-CM | POA: Diagnosis not present

## 2015-08-13 DIAGNOSIS — H401122 Primary open-angle glaucoma, left eye, moderate stage: Secondary | ICD-10-CM | POA: Diagnosis not present

## 2015-08-13 DIAGNOSIS — H401111 Primary open-angle glaucoma, right eye, mild stage: Secondary | ICD-10-CM | POA: Diagnosis not present

## 2015-09-24 DIAGNOSIS — G4733 Obstructive sleep apnea (adult) (pediatric): Secondary | ICD-10-CM | POA: Diagnosis not present

## 2015-10-11 DIAGNOSIS — S61200A Unspecified open wound of right index finger without damage to nail, initial encounter: Secondary | ICD-10-CM | POA: Diagnosis not present

## 2015-10-16 DIAGNOSIS — S61210D Laceration without foreign body of right index finger without damage to nail, subsequent encounter: Secondary | ICD-10-CM | POA: Diagnosis not present

## 2015-10-18 DIAGNOSIS — Z4802 Encounter for removal of sutures: Secondary | ICD-10-CM | POA: Diagnosis not present

## 2015-10-18 DIAGNOSIS — S61200D Unspecified open wound of right index finger without damage to nail, subsequent encounter: Secondary | ICD-10-CM | POA: Diagnosis not present

## 2015-11-20 DIAGNOSIS — D239 Other benign neoplasm of skin, unspecified: Secondary | ICD-10-CM | POA: Diagnosis not present

## 2015-11-20 DIAGNOSIS — L570 Actinic keratosis: Secondary | ICD-10-CM | POA: Diagnosis not present

## 2015-11-20 DIAGNOSIS — L409 Psoriasis, unspecified: Secondary | ICD-10-CM | POA: Diagnosis not present

## 2015-12-26 DIAGNOSIS — I739 Peripheral vascular disease, unspecified: Secondary | ICD-10-CM | POA: Diagnosis not present

## 2015-12-26 DIAGNOSIS — M19072 Primary osteoarthritis, left ankle and foot: Secondary | ICD-10-CM | POA: Diagnosis not present

## 2015-12-26 DIAGNOSIS — M19071 Primary osteoarthritis, right ankle and foot: Secondary | ICD-10-CM | POA: Diagnosis not present

## 2016-01-14 ENCOUNTER — Ambulatory Visit (INDEPENDENT_AMBULATORY_CARE_PROVIDER_SITE_OTHER): Payer: Medicare Other | Admitting: Cardiovascular Disease

## 2016-01-14 ENCOUNTER — Encounter: Payer: Self-pay | Admitting: Cardiovascular Disease

## 2016-01-14 VITALS — BP 144/76 | HR 63 | Ht 68.0 in | Wt 231.4 lb

## 2016-01-14 DIAGNOSIS — E785 Hyperlipidemia, unspecified: Secondary | ICD-10-CM | POA: Diagnosis not present

## 2016-01-14 DIAGNOSIS — I10 Essential (primary) hypertension: Secondary | ICD-10-CM | POA: Diagnosis not present

## 2016-01-14 DIAGNOSIS — G4733 Obstructive sleep apnea (adult) (pediatric): Secondary | ICD-10-CM | POA: Diagnosis not present

## 2016-01-14 DIAGNOSIS — R011 Cardiac murmur, unspecified: Secondary | ICD-10-CM

## 2016-01-14 DIAGNOSIS — I739 Peripheral vascular disease, unspecified: Secondary | ICD-10-CM

## 2016-01-14 NOTE — Assessment & Plan Note (Signed)
History of hypertension blood pressure measured 144/76. He is on amlodipine, enalapril and losartan. Continue current meds at current dosing

## 2016-01-14 NOTE — Patient Instructions (Signed)
Medication Instructions:  NO CHANGES.  Labwork: Labwork will be requested from your primary care physician.   Testing/Procedures: Your physician has requested that you have an echocardiogram. Echocardiography is a painless test that uses sound waves to create images of your heart. It provides your doctor with information about the size and shape of your heart and how well your heart's chambers and valves are working. This procedure takes approximately one hour. There are no restrictions for this procedure.    Follow-Up: Your physician wants you to follow-up in: [place]. You will receive a reminder letter in the mail two months in advance. If you don't receive a letter, please call our office to schedule the follow-up appointment.   If you need a refill on your cardiac medications before your next appointment, please call your pharmacy.

## 2016-01-14 NOTE — Assessment & Plan Note (Signed)
History of obstructive sleep apnea status post aortobifemoral bypass grafting by Dr. Kellie Simmering in 2005. He did have lower extremity arterial Doppler studies performed 05/03/13 revealing patent bypass graft, a right ABI 0.87 with an occluded right SFA and popliteal and left ABI 0.87 as well with a moderate amount of plaque in the short segment occlusion. He denies claudication.

## 2016-01-14 NOTE — Progress Notes (Signed)
01/14/2016 [name]   [birth year]  [id number]  Primary Physician  Melinda Crutch, MD Primary Cardiologist: Lorretta Harp MD Lupe Carney, [place]  HPI:  The patient is a 70 year old, mildly overweight, married Caucasian male, father of 15, grandfather to 3 grandchildren who I last saw 07/20/14. He has a history of CAD status post coronary artery bypass grafting in 1992 by Dr. Ceasar Mons. He also had aortobifemoral bypass grafting secondary to abdominal aortic aneurysm done by Dr. Mamie Nick in 2005. His other problems include treated hypertension, dyslipidemia, and obstructive sleep apnea though he no longer wears CPAP. He denies chest pain or shortness of breath. He had an uncomplicated right total knee replacement performed by Dr. Gaynelle Arabian July of last year. Myoview stress test performed March 2012 was nonischemic and carotid Dopplers were normal as well. He had a Myoview stress test performed in March of this year that showed inferolateral scar without ischemia with an EF in the 50% range. He is asymptomatic. His lipid profile followed by his primary care physician. Because of mild lifestyle limiting claudication lower extremity arterial Dopplers were performed in our office on 05/03/13 which revealed a patent aortobifemoral bypass graft, severe SFA disease bilaterally with ABIs in the 0.87 range. Since I saw him back one year ago he has remained chronically stable. He still has mild claudication. He has changed his diet dramatically after reading several books recommended by his primary care doctor including Eat To Live  by Dr. Baker Janus. He denies chest pain but does complain of some orthopnea as well as lower extremity edema since I saw him last.    Current Outpatient Prescriptions  Medication Sig Dispense Refill  . amLODipine (NORVASC) 10 MG tablet Take 10 mg by mouth daily.    Marland Kitchen aspirin 325 MG tablet Take 325 mg by mouth daily.    . enalapril (VASOTEC) 20 MG tablet Take 20  mg by mouth 2 (two) times daily.    . famotidine (PEPCID) 40 MG tablet Take 40 mg by mouth daily.    Marland Kitchen losartan (COZAAR) 100 MG tablet Take 100 mg by mouth daily.    . simvastatin (ZOCOR) 20 MG tablet Take 20 mg by mouth daily.     No current facility-administered medications for this visit.     Allergies  Allergen Reactions  . Statins Other (See Comments)    Intolerance     Social History   Social History  . Marital status: Married    Spouse name: N/A  . Number of children: N/A  . Years of education: N/A   Occupational History  . Not on file.   Social History Main Topics  . Smoking status: Former Smoker    Quit date: 08/26/1990  . Smokeless tobacco: Not on file  . Alcohol use Yes     Comment: occassional   . Drug use: Unknown  . Sexual activity: Not on file   Other Topics Concern  . Not on file   Social History Narrative  . No narrative on file     Review of Systems: General: negative for chills, fever, night sweats or weight changes.  Cardiovascular: negative for chest pain, dyspnea on exertion, edema, orthopnea, palpitations, paroxysmal nocturnal dyspnea or shortness of breath Dermatological: negative for rash Respiratory: negative for cough or wheezing Urologic: negative for hematuria Abdominal: negative for nausea, vomiting, diarrhea, bright red blood per rectum, melena, or hematemesis Neurologic: negative for visual changes, syncope, or dizziness All other systems  reviewed and are otherwise negative except as noted above.    Blood pressure (!) 144/76, pulse 63, height 5\' 8"  (1.727 m), weight 231 lb 6.4 oz (105 kg).  General appearance: alert and no distress Neck: no adenopathy, no carotid bruit, no JVD, supple, symmetrical, trachea midline and thyroid not enlarged, symmetric, no tenderness/mass/nodules Lungs: clear to auscultation bilaterally Heart: Soft outflow tract murmur at the base consistent with aortic stenosis and/or sclerosis. Extremities: Trace  bilateral lower extremity edema  EKG normal sinus rhythm at 63 with septal Q waves and nonspecific ST and T-wave changes. I personally reviewed this EKG  ASSESSMENT AND PLAN:   Coronary artery disease History of CAD status post bypass grafting in 1992 by Dr. Servando Snare. He had a Myoview stress test performed 07/07/12 which is nonischemic. He denies chest pain but does get some orthopnea and has had some lower extremity swelling. He does have an outflow tract murmur as well. I'm going to obtain a 2-D echocardiogram to further evaluate this.  Essential hypertension History of hypertension blood pressure measured 144/76. He is on amlodipine, enalapril and losartan. Continue current meds at current dosing  Hyperlipidemia History of hyperlipidemia on statin therapy followed by his PCP  Obstructive sleep apnea History of obstructive sleep apnea on CPAP Which he benefits from  Peripheral arterial disease History of obstructive sleep apnea status post aortobifemoral bypass grafting by Dr. Kellie Simmering in 2005. He did have lower extremity arterial Doppler studies performed 05/03/13 revealing patent bypass graft, a right ABI 0.87 with an occluded right SFA and popliteal and left ABI 0.87 as well with a moderate amount of plaque in the short segment occlusion. He denies claudication.      Lorretta Harp MD FACP,FACC,FAHA, [facility] 01/14/2016 9:07 AM

## 2016-01-14 NOTE — Assessment & Plan Note (Signed)
History of CAD status post bypass grafting in 1992 by Dr. Servando Snare. He had a Myoview stress test performed 07/07/12 which is nonischemic. He denies chest pain but does get some orthopnea and has had some lower extremity swelling. He does have an outflow tract murmur as well. I'm going to obtain a 2-D echocardiogram to further evaluate this.

## 2016-01-14 NOTE — Assessment & Plan Note (Signed)
History of hyperlipidemia on statin therapy followed by his PCP 

## 2016-01-14 NOTE — Assessment & Plan Note (Signed)
History of obstructive sleep apnea on CPAP Which he benefits from

## 2016-01-28 ENCOUNTER — Other Ambulatory Visit: Payer: Self-pay

## 2016-01-28 ENCOUNTER — Ambulatory Visit (HOSPITAL_COMMUNITY): Payer: Medicare Other | Attending: Cardiovascular Disease

## 2016-01-28 DIAGNOSIS — E785 Hyperlipidemia, unspecified: Secondary | ICD-10-CM | POA: Diagnosis not present

## 2016-01-28 DIAGNOSIS — R011 Cardiac murmur, unspecified: Secondary | ICD-10-CM | POA: Diagnosis not present

## 2016-01-28 DIAGNOSIS — I2510 Atherosclerotic heart disease of native coronary artery without angina pectoris: Secondary | ICD-10-CM | POA: Insufficient documentation

## 2016-01-28 DIAGNOSIS — I739 Peripheral vascular disease, unspecified: Secondary | ICD-10-CM | POA: Insufficient documentation

## 2016-01-28 DIAGNOSIS — I119 Hypertensive heart disease without heart failure: Secondary | ICD-10-CM | POA: Diagnosis not present

## 2016-01-28 DIAGNOSIS — Z87891 Personal history of nicotine dependence: Secondary | ICD-10-CM | POA: Diagnosis not present

## 2016-01-28 DIAGNOSIS — G4733 Obstructive sleep apnea (adult) (pediatric): Secondary | ICD-10-CM | POA: Diagnosis not present

## 2016-01-28 DIAGNOSIS — Z951 Presence of aortocoronary bypass graft: Secondary | ICD-10-CM | POA: Diagnosis not present

## 2016-01-28 DIAGNOSIS — I10 Essential (primary) hypertension: Secondary | ICD-10-CM | POA: Diagnosis not present

## 2016-02-05 ENCOUNTER — Telehealth: Payer: Self-pay | Admitting: Cardiovascular Disease

## 2016-02-05 NOTE — Telephone Encounter (Signed)
New message ° ° ° ° ° °Returning a call to the nurse to get test results °

## 2016-02-05 NOTE — Telephone Encounter (Signed)
Spoke to patient. Result given . Verbalized understanding  

## 2016-02-18 DIAGNOSIS — H401122 Primary open-angle glaucoma, left eye, moderate stage: Secondary | ICD-10-CM | POA: Diagnosis not present

## 2016-02-18 DIAGNOSIS — H401111 Primary open-angle glaucoma, right eye, mild stage: Secondary | ICD-10-CM | POA: Diagnosis not present

## 2016-02-18 DIAGNOSIS — H25013 Cortical age-related cataract, bilateral: Secondary | ICD-10-CM | POA: Diagnosis not present

## 2016-02-18 DIAGNOSIS — H2513 Age-related nuclear cataract, bilateral: Secondary | ICD-10-CM | POA: Diagnosis not present

## 2016-04-03 DIAGNOSIS — J329 Chronic sinusitis, unspecified: Secondary | ICD-10-CM | POA: Diagnosis not present

## 2017-01-13 ENCOUNTER — Ambulatory Visit: Payer: Medicare Other | Admitting: Cardiovascular Disease

## 2017-02-17 ENCOUNTER — Encounter: Payer: Self-pay | Admitting: Cardiovascular Disease

## 2017-02-17 ENCOUNTER — Ambulatory Visit (INDEPENDENT_AMBULATORY_CARE_PROVIDER_SITE_OTHER): Payer: Medicare Other | Admitting: Cardiovascular Disease

## 2017-02-17 DIAGNOSIS — I10 Essential (primary) hypertension: Secondary | ICD-10-CM | POA: Diagnosis not present

## 2017-02-17 DIAGNOSIS — I739 Peripheral vascular disease, unspecified: Secondary | ICD-10-CM

## 2017-02-17 DIAGNOSIS — G4733 Obstructive sleep apnea (adult) (pediatric): Secondary | ICD-10-CM | POA: Diagnosis not present

## 2017-02-17 DIAGNOSIS — I2510 Atherosclerotic heart disease of native coronary artery without angina pectoris: Secondary | ICD-10-CM

## 2017-02-17 DIAGNOSIS — E7800 Pure hypercholesterolemia, unspecified: Secondary | ICD-10-CM | POA: Diagnosis not present

## 2017-02-17 NOTE — Progress Notes (Signed)
02/17/2017 Johnny [name]   04/07/1946  [id number]  Primary Physician Lawerance Cruel, MD Primary Cardiologist: Lorretta Harp MD Garret Reddish, [place], [place]  HPI:  Johnny [name] is a 71 y.o. male mildly overweight, married Caucasian male, father of 76, grandfather to 3 grandchildren who I last saw 01/14/16. He has a history of CAD status post coronary artery bypass grafting in 1992 by Dr. Ceasar Mons. He also had aortobifemoral bypass grafting secondary to abdominal aortic aneurysm done by Dr. Mamie Nick in 2005. His other problems include treated hypertension, dyslipidemia, and obstructive sleep apnea on CPAP which he wears and benefits from. He denies chest pain or shortness of breath. He had an uncomplicated right total knee replacement performed by Dr. Gaynelle Arabian July of last year. Myoview stress test performed March 2012 was nonischemic and carotid Dopplers were normal as well. He had a Myoview stress test performed in March of this year that showed inferolateral scar without ischemia with an EF in the 50% range. He is asymptomatic. His lipid profile followed by his primary care physician. Because of mild lifestyle limiting claudication lower extremity arterial Dopplers were performed in our office on 05/03/13 which revealed a patent aortobifemoral bypass graft, severe SFA disease bilaterally with ABIs in the 0.87 range. Since I saw him back one year ago he has remained chronically stable. He still has mild claudication. He has changed his diet dramatically after reading several books recommended by his primary care doctor including Eat To Live by Dr. Baker Janus. Since I saw him a year ago she remained stable. He has gained 20 pounds because of lack of exercise. He denies chest pain or shortness of breath but does get some mild claudication..    Current Meds  Medication Sig  . amLODipine (NORVASC) 10 MG tablet Take 10 mg by mouth daily.  Marland Kitchen aspirin 325 MG tablet Take 325 mg by mouth  daily.  . enalapril (VASOTEC) 20 MG tablet Take 20 mg by mouth 2 (two) times daily.  . famotidine (PEPCID) 40 MG tablet Take 40 mg by mouth daily.  Marland Kitchen losartan (COZAAR) 100 MG tablet Take 100 mg by mouth daily.  . simvastatin (ZOCOR) 20 MG tablet Take 20 mg by mouth daily.     Allergies  Allergen Reactions  . Statins Other (See Comments)    Intolerance     Social History   Social History  . Marital status: Married    Spouse name: N/A  . Number of children: N/A  . Years of education: N/A   Occupational History  . Not on file.   Social History Main Topics  . Smoking status: Former Smoker    Quit date: 08/26/1990  . Smokeless tobacco: Not on file  . Alcohol use Yes     Comment: occassional   . Drug use: Unknown  . Sexual activity: Not on file   Other Topics Concern  . Not on file   Social History Narrative  . No narrative on file     Review of Systems: General: negative for chills, fever, night sweats or weight changes.  Cardiovascular: negative for chest pain, dyspnea on exertion, edema, orthopnea, palpitations, paroxysmal nocturnal dyspnea or shortness of breath Dermatological: negative for rash Respiratory: negative for cough or wheezing Urologic: negative for hematuria Abdominal: negative for nausea, vomiting, diarrhea, bright red blood per rectum, melena, or hematemesis Neurologic: negative for visual changes, syncope, or dizziness All other systems reviewed and are otherwise negative except as  noted above.    Blood pressure (!) 167/81, pulse (!) 54, height 5\' 8"  (1.727 m), weight 234 lb 3.2 oz (106.2 kg).  General appearance: alert and no distress Neck: no adenopathy, no carotid bruit, no JVD, supple, symmetrical, trachea midline and thyroid not enlarged, symmetric, no tenderness/mass/nodules Lungs: clear to auscultation bilaterally Heart: regular rate and rhythm, S1, S2 normal, no murmur, click, rub or gallop Extremities: extremities normal, atraumatic, no  cyanosis or edema Pulses: 2+ and symmetric Skin: Skin color, texture, turgor normal. No rashes or lesions Neurologic: Alert and oriented X 3, normal strength and tone. Normal symmetric reflexes. Normal coordination and gait  EKG sinus bradycardia at 54 with incomplete right bundle-branch block and septal Q waves. I personally reviewed this EKG.  ASSESSMENT AND PLAN:   Coronary artery disease History of CAD status post bypass grafting in 1992 by Dr. Pia Mau. His last Myoview stress test performed March 2012 was nonischemic. He denies chest pain or shortness of breath.  Essential hypertension History of essential hypertension blood pressure measured at 167/81. He is on amlodipine, enalapril and losartan. Continue current meds at current dosing  Hyperlipidemia History of hyperlipidemia on statin therapy. We will recheck a lipid and liver profile  Obstructive sleep apnea History of obstructive sleep apnea on CPAP which he wears and benefits from  Peripheral arterial disease History of peripheral arterial disease status post aortobifemoral bypass grafting by Dr. Kellie Simmering in 2005. He does have diseased SFAs bilaterally by duplex ultrasound performed 05/03/13 with some mild lifestyle limiting claudication      Lorretta Harp MD [facility], [facility] 02/17/2017 12:17 PM

## 2017-02-17 NOTE — Assessment & Plan Note (Signed)
History of obstructive sleep apnea on CPAP which he wears and benefits from

## 2017-02-17 NOTE — Patient Instructions (Signed)
Medication Instructions: Your physician recommends that you continue on your current medications as directed. Please refer to the Current Medication list given to you today.  Labwork: Your physician recommends that you return for a FASTING lipid profile and hepatic function profile at your earliest convenience.   Follow-Up: Your physician wants you to follow-up in: 1 year with Dr. Gwenlyn Found. You will receive a reminder letter in the mail two months in advance. If you don't receive a letter, please call our office to schedule the follow-up appointment.  If you need a refill on your cardiac medications before your next appointment, please call your pharmacy.

## 2017-02-17 NOTE — Assessment & Plan Note (Signed)
History of peripheral arterial disease status post aortobifemoral bypass grafting by Dr. Kellie Simmering in 2005. He does have diseased SFAs bilaterally by duplex ultrasound performed 05/03/13 with some mild lifestyle limiting claudication

## 2017-02-17 NOTE — Assessment & Plan Note (Signed)
History of hyperlipidemia on statin therapy. We will recheck a lipid and liver profile 

## 2017-02-17 NOTE — Assessment & Plan Note (Signed)
History of essential hypertension blood pressure measured at 167/81. He is on amlodipine, enalapril and losartan. Continue current meds at current dosing

## 2017-02-17 NOTE — Assessment & Plan Note (Signed)
History of CAD status post bypass grafting in 1992 by Dr. Pia Mau. His last Myoview stress test performed March 2012 was nonischemic. He denies chest pain or shortness of breath.

## 2017-02-23 LAB — LIPID PANEL
Chol/HDL Ratio: 2.8 ratio (ref 0.0–5.0)
Cholesterol, Total: 182 mg/dL (ref 100–199)
HDL: 66 mg/dL (ref >39)
LDL Calculated: 88 mg/dL (ref 0–99)
Triglycerides: 140 mg/dL (ref 0–149)
VLDL Cholesterol Cal: 28 mg/dL (ref 5–40)

## 2017-02-23 LAB — HEPATIC FUNCTION PANEL
ALT: 15 IU/L (ref 0–44)
AST: 16 IU/L (ref 0–40)
Albumin: 4.3 g/dL (ref 3.5–4.8)
Alkaline Phosphatase: 94 IU/L (ref 39–117)
Bilirubin Total: 0.9 mg/dL (ref 0.0–1.2)
Bilirubin, Direct: 0.19 mg/dL (ref 0.00–0.40)
Total Protein: 6.8 g/dL (ref 6.0–8.5)

## 2017-03-03 ENCOUNTER — Telehealth: Payer: Self-pay | Admitting: Cardiovascular Disease

## 2017-03-03 ENCOUNTER — Encounter: Payer: Self-pay | Admitting: Cardiovascular Disease

## 2017-03-03 DIAGNOSIS — E785 Hyperlipidemia, unspecified: Secondary | ICD-10-CM

## 2017-03-03 MED ORDER — EZETIMIBE 10 MG PO TABS: 10.0000 mg | ORAL_TABLET | Freq: Every day | ORAL | 3 refills | Status: DC

## 2017-03-03 NOTE — Telephone Encounter (Signed)
-----   Message from Lorretta Harp, MD sent at 03/02/2017 12:47 PM EST ----- Not at goal on simvastatin 20 mg a day. Add Zetia 10 mg a day and recheck in 8 weeks

## 2017-03-03 NOTE — Telephone Encounter (Signed)
Patient called w/results. Agrees w/MD plan. Copy of labs + lab order mailed to patient.

## 2017-04-12 ENCOUNTER — Telehealth: Payer: Self-pay | Admitting: Cardiovascular Disease

## 2017-04-12 NOTE — Telephone Encounter (Signed)
New Message   Pt c/o medication issue:  1. Name of Medication: ezetimibe  2. How are you currently taking this medication (dosage and times per day)? 10mg  once a day   3. Are you having a reaction (difficulty breathing--STAT)? no  4. What is your medication issue? Patient states that the medication is causing extreme fatigue. He states that he can barely get out of bed.

## 2017-04-12 NOTE — Telephone Encounter (Signed)
Returned call to patient, he was unable to talk.Stated he will call back.

## 2017-04-12 NOTE — Telephone Encounter (Signed)
Received call back from patient he stated he cannot take Zetia,causes no energy.He stopped taking last Friday and he already feels better.Advised I will send message to [facility] for advice.

## 2017-04-13 NOTE — Telephone Encounter (Signed)
Recheck lipid liver profile 2 months after stopping Zetia.

## 2017-04-16 NOTE — Telephone Encounter (Signed)
Pt aware of MD recommendation. Told pt I will send lab slips when closer to time to recheck.

## 2017-05-20 ENCOUNTER — Encounter: Payer: Self-pay | Admitting: Cardiovascular Disease

## 2017-12-20 ENCOUNTER — Telehealth: Payer: Self-pay | Admitting: Cardiovascular Disease

## 2017-12-20 NOTE — Telephone Encounter (Signed)
Returned call to patient he stated he walked up 3 flights of stairs this past weekend and noticed he was very sob.Stated he has noticed sob is getting worse with activity.Stated he scheduled appointment with Dr.Berry tomorrow 12/21/17.Advised to keep appointment as planned.

## 2017-12-20 NOTE — Telephone Encounter (Signed)
New message     Pt c/o Shortness Of Breath: STAT if SOB developed within the last 24 hours or pt is noticeably SOB on the phone  1. Are you currently SOB (can you hear that pt is SOB on the phone)? No  2. How long have you been experiencing SOB? months 3. Are you SOB when sitting or when up moving around? Moving around, when walking   4. Are you currently experiencing any other symptoms? No

## 2017-12-21 ENCOUNTER — Encounter: Payer: Self-pay | Admitting: Cardiovascular Disease

## 2017-12-21 ENCOUNTER — Ambulatory Visit: Payer: Medicare Other | Admitting: Cardiovascular Disease

## 2017-12-21 VITALS — BP 152/74 | HR 51 | Ht 67.5 in | Wt 236.0 lb

## 2017-12-21 DIAGNOSIS — R011 Cardiac murmur, unspecified: Secondary | ICD-10-CM | POA: Diagnosis not present

## 2017-12-21 DIAGNOSIS — I2510 Atherosclerotic heart disease of native coronary artery without angina pectoris: Secondary | ICD-10-CM

## 2017-12-21 DIAGNOSIS — I10 Essential (primary) hypertension: Secondary | ICD-10-CM

## 2017-12-21 DIAGNOSIS — E7800 Pure hypercholesterolemia, unspecified: Secondary | ICD-10-CM | POA: Diagnosis not present

## 2017-12-21 DIAGNOSIS — G4733 Obstructive sleep apnea (adult) (pediatric): Secondary | ICD-10-CM

## 2017-12-21 DIAGNOSIS — I739 Peripheral vascular disease, unspecified: Secondary | ICD-10-CM

## 2017-12-21 NOTE — Assessment & Plan Note (Signed)
History of hyperlipidemia on statin therapy. 

## 2017-12-21 NOTE — Assessment & Plan Note (Signed)
History of obstructive sleep apnea on CPAP which he benefits from 

## 2017-12-21 NOTE — Progress Notes (Signed)
12/21/2017 Johnny [name]   08-Dec-1945  [id number]  Primary Physician Johnny Cruel, MD Primary Cardiologist: Johnny Harp MD FACP, [place], Johnny [name], [place]  HPI:  Johnny [name] is a 72 y.o.   mildly overweight, married Caucasian male, father of 2, grandfather to 3 grandchildren who I last saw  02/17/2017. He has a history of CAD status post coronary artery bypass grafting in 1992 by Dr. Ceasar [name]. He also had aortobifemoral bypass grafting secondary to abdominal aortic aneurysm done by Dr. Mamie [name] in 2005. His other problems include treated hypertension, dyslipidemia, and obstructive sleep apnea on CPAP which he wears and benefits from. He denies chest pain or shortness of breath. He had an uncomplicated right total knee replacement performed by Dr. Gaynelle [name] July of last year. Myoview stress test performed March 2012 was nonischemic and carotid Dopplers were normal as well. He had a Myoview stress test performed in March of this year that showed inferolateral scar without ischemia with an EF in the 50% range. He is asymptomatic. His lipid profile followed by his primary care physician. Because of mild lifestyle limiting claudication lower extremity arterial Dopplers were performed in our office on 05/03/13 which revealed a patent aortobifemoral bypass graft, severe SFA disease bilaterally with ABIs in the 0.87 range. Since I saw him back one year ago he has remained chronically stable. He still has mild claudication. He has changed his diet dramatically after reading several books recommended by his primary care doctor including Eat To Live by Dr. Baker [name].   Since I saw him a year ago he is remained stable.  He has noticed some slight increasing dyspnea when walking up an incline especially when playing 9 holes of golf or walking up multiple flights of stairs carrying heavy objects.  He denies chest pain.  He has mild lifestyle limiting claudication.  His weight has remained  stable.  He is not working out regularly like he did several years ago..    Current Meds  Medication Sig  . amLODipine (NORVASC) 10 MG tablet Take 10 mg by mouth daily.  Marland Kitchen aspirin 325 MG tablet Take 325 mg by mouth daily.  . famotidine (PEPCID) 40 MG tablet Take 40 mg by mouth daily.  Marland Kitchen olmesartan (BENICAR) 40 MG tablet Take 1 tablet by mouth daily.  . simvastatin (ZOCOR) 20 MG tablet Take 20 mg by mouth daily.     Allergies  Allergen Reactions  . Statins Other (See Comments)    Intolerance     Social History   Socioeconomic History  . Marital status: Married    Spouse name: Not on file  . Number of children: Not on file  . Years of education: Not on file  . Highest education level: Not on file  Occupational History  . Not on file  Social Needs  . Financial resource strain: Not on file  . Food insecurity:    Worry: Not on file    Inability: Not on file  . Transportation needs:    Medical: Not on file    Non-medical: Not on file  Tobacco Use  . Smoking status: Former Smoker    Last attempt to quit: 08/26/1990    Years since quitting: 27.3  . Smokeless tobacco: Never Used  Substance and Sexual Activity  . Alcohol use: Yes    Comment: occassional   . Drug use: No  . Sexual activity: Not Currently  Lifestyle  . Physical activity:  Days per week: Not on file    Minutes per session: Not on file  . Stress: Not on file  Relationships  . Social connections:    Talks on phone: Not on file    Gets together: Not on file    Attends religious service: Not on file    Active member of club or organization: Not on file    Attends meetings of clubs or organizations: Not on file    Relationship status: Not on file  . Intimate partner violence:    Fear of current or ex partner: Not on file    Emotionally abused: Not on file    Physically abused: Not on file    Forced sexual activity: Not on file  Other Topics Concern  . Not on file  Social History Narrative  . Not on  file     Review of Systems: General: negative for chills, fever, night sweats or weight changes.  Cardiovascular: negative for chest pain, dyspnea on exertion, edema, orthopnea, palpitations, paroxysmal nocturnal dyspnea or shortness of breath Dermatological: negative for rash Respiratory: negative for cough or wheezing Urologic: negative for hematuria Abdominal: negative for nausea, vomiting, diarrhea, bright red blood per rectum, melena, or hematemesis Neurologic: negative for visual changes, syncope, or dizziness All other systems reviewed and are otherwise negative except as noted above.    Blood pressure (!) 152/74, pulse (!) 51, height 5' 7.5" (1.715 m), weight 236 lb (107 kg).  General appearance: alert and no distress Neck: no adenopathy, no carotid bruit, no JVD, supple, symmetrical, trachea midline and thyroid not enlarged, symmetric, no tenderness/mass/nodules Lungs: clear to auscultation bilaterally Heart: 2/6 ejection murmur at the base Extremities: extremities normal, atraumatic, no cyanosis or edema Pulses: Diminished pedal pulses bilaterally Skin: Skin color, texture, turgor normal. No rashes or lesions Neurologic: Alert and oriented X 3, normal strength and tone. Normal symmetric reflexes. Normal coordination and gait  EKG sinus bradycardia 51 with septal Q waves and nonspecific ST and T wave changes.  I personally reviewed this EKG.  ASSESSMENT AND PLAN:   Coronary artery disease History of CAD status post bypass grafting by Johnny [name] in 1992.  He has not had cardiac catheterization since.  His last Myoview performed 07/07/2012 was nonischemic with a fixed inferolateral and anteroseptal defect without ischemia.  He really denies chest pain but does get some dyspnea on exertion.  Essential hypertension History of essential hypertension her blood pressure measured today at 152/74.  He is on amlodipine and Benicar.  Continue current meds at current  dosing.  Hyperlipidemia History of hyperlipidemia on statin therapy.  Obstructive sleep apnea History of obstructive sleep apnea on CPAP which he benefits from  Peripheral arterial disease History of peripheral arterial disease status post aortobifemoral bypass grafting by Dr. Mamie [name] in 2005.  He did have Dopplers performed 05/03/2013 that revealed ABIs in the .8-.9 range bilaterally with an intact bypass graft and chronically diseased SFAs.  He has mild lifestyle limiting claudication.      Johnny Harp MD FACP,FACC,FAHA, [facility] 12/21/2017 8:58 AM

## 2017-12-21 NOTE — Patient Instructions (Signed)
Medication Instructions:   NO CHANGE  Testing/Procedures:  Your physician has requested that you have an echocardiogram. Echocardiography is a painless test that uses sound waves to create images of your heart. It provides your doctor with information about the size and shape of your heart and how well your heart's chambers and valves are working. This procedure takes approximately one hour. There are no restrictions for this procedure.    Follow-Up:  Your physician wants you to follow-up in: 6 MONTHS WITH APP You will receive a reminder letter in the mail two months in advance. If you don't receive a letter, please call our office to schedule the follow-up appointment.   Your physician wants you to follow-up in: [place] will receive a reminder letter in the mail two months in advance. If you don't receive a letter, please call our office to schedule the follow-up appointment.   If you need a refill on your cardiac medications before your next appointment, please call your pharmacy.

## 2017-12-21 NOTE — Assessment & Plan Note (Signed)
History of essential hypertension her blood pressure measured today at 152/74.  He is on amlodipine and Benicar.  Continue current meds at current dosing.

## 2017-12-21 NOTE — Assessment & Plan Note (Signed)
History of CAD status post bypass grafting by Dr. Servando Snare in 1992.  He has not had cardiac catheterization since.  His last Myoview performed 07/07/2012 was nonischemic with a fixed inferolateral and anteroseptal defect without ischemia.  He really denies chest pain but does get some dyspnea on exertion.

## 2017-12-21 NOTE — Assessment & Plan Note (Signed)
History of peripheral arterial disease status post aortobifemoral bypass grafting by Dr. Mamie Nick in 2005.  He did have Dopplers performed 05/03/2013 that revealed ABIs in the .8-.9 range bilaterally with an intact bypass graft and chronically diseased SFAs.  He has mild lifestyle limiting claudication.

## 2017-12-28 ENCOUNTER — Telehealth: Payer: Self-pay | Admitting: Cardiovascular Disease

## 2017-12-28 NOTE — Telephone Encounter (Signed)
Spoke with pt, aware of the possible formation of keyloids related to the surgery. His surgery was in 2005. He denies any problems in that area. Reassurance given to the patient.

## 2017-12-28 NOTE — Telephone Encounter (Signed)
New message:      Pt states he has knots on both sides of his incision from his aortic aneurysm. Pt states he meant to tell Gwenlyn Found at his last visit but it slipped his mind

## 2017-12-29 ENCOUNTER — Other Ambulatory Visit: Payer: Self-pay

## 2017-12-29 ENCOUNTER — Ambulatory Visit (HOSPITAL_COMMUNITY): Payer: Medicare Other | Attending: Cardiovascular Disease

## 2017-12-29 DIAGNOSIS — Z951 Presence of aortocoronary bypass graft: Secondary | ICD-10-CM | POA: Diagnosis not present

## 2017-12-29 DIAGNOSIS — I083 Combined rheumatic disorders of mitral, aortic and tricuspid valves: Secondary | ICD-10-CM | POA: Insufficient documentation

## 2017-12-29 DIAGNOSIS — E785 Hyperlipidemia, unspecified: Secondary | ICD-10-CM | POA: Insufficient documentation

## 2017-12-29 DIAGNOSIS — I739 Peripheral vascular disease, unspecified: Secondary | ICD-10-CM | POA: Diagnosis not present

## 2017-12-29 DIAGNOSIS — I2510 Atherosclerotic heart disease of native coronary artery without angina pectoris: Secondary | ICD-10-CM | POA: Diagnosis not present

## 2017-12-29 DIAGNOSIS — I119 Hypertensive heart disease without heart failure: Secondary | ICD-10-CM | POA: Diagnosis not present

## 2017-12-29 DIAGNOSIS — G4733 Obstructive sleep apnea (adult) (pediatric): Secondary | ICD-10-CM | POA: Insufficient documentation

## 2017-12-29 DIAGNOSIS — R011 Cardiac murmur, unspecified: Secondary | ICD-10-CM | POA: Diagnosis present

## 2018-01-13 ENCOUNTER — Telehealth: Payer: Self-pay | Admitting: Cardiovascular Disease

## 2018-01-13 NOTE — Telephone Encounter (Signed)
Called patient and advised of Echo results.  Patient verbalized understanding.

## 2018-01-13 NOTE — Telephone Encounter (Signed)
New Message       Patient is calling for his CT results. Pls call and advise.

## 2018-01-18 ENCOUNTER — Other Ambulatory Visit: Payer: Self-pay | Admitting: *Deleted

## 2018-01-18 DIAGNOSIS — I350 Nonrheumatic aortic (valve) stenosis: Secondary | ICD-10-CM

## 2018-05-03 NOTE — Progress Notes (Signed)
Cardiology Office Note   Date:  05/04/2018   ID:  Shafer, Swamy 1945-06-27, MRN [id number]  PCP:  Johnny Cruel, MD  Cardiologist:  [facility]   Chief Complaint  Patient presents with  . Hypertension  . Coronary Artery Disease  . PAD     History of Present Illness: Johnny [name] is a 73 y.o. male who presents for ongoing assessment and management of CAD s/p CABG in 1992, AAA and aortobifemoral bypass in 2005, most recent myoview stress test in 06/2017 revealed inferolateral scar without ischemia, EF of 50%. Was last seen by  Dr.Berry on 12/21/2017 and was clinically stable.   He comes today with complaints of LEE and fatigue. He states that he used to play basket ball and be athletic until about 4 years ago. He works at a [employer] and is not as active as before. He has been gaining some weight and noticed his BP has been more difficult to control.  He has OSA and is compliant with CPAP. He is medically compliant with all of his medications.    Past Medical History:  Diagnosis Date  . Abdominal aortic aneurysm ([place])   . Cardiac murmur   . Coronary artery disease   . Heart attack ([place])   . Hemorrhoids    Occasional bleeding   . Hiatal hernia   . History of myocardial infarction 1992  . History of rheumatic fever   . Hyperlipidemia   . Hypertension   . Obstructive sleep apnea   . Osteoarthritis of knee    Right knee  . Peripheral arterial disease ([place])    status post aortobifemoral bypass grafting by Dr. Kellie Simmering in 2005  . Sleep apnea     Past Surgical History:  Procedure Laterality Date  . ABDOMINAL AORTIC ANEURYSM REPAIR  2005  . CARDIAC CATHETERIZATION  1992   followed by open heart coronary artery bypass grafting of 4 vessels post MI  . CHOLECYSTECTOMY    . CORONARY ARTERY BYPASS GRAFT    . REPLACEMENT TOTAL KNEE     Right knee cartilage surgery in 1976     Current Outpatient Medications  Medication Sig Dispense Refill  . amLODipine (NORVASC) 10  MG tablet Take 10 mg by mouth daily.    Marland Kitchen aspirin 325 MG tablet Take 325 mg by mouth daily.    . famotidine (PEPCID) 40 MG tablet Take 40 mg by mouth daily.    Marland Kitchen latanoprost (XALATAN) 0.005 % ophthalmic solution 1 drop at bedtime.    Marland Kitchen olmesartan (BENICAR) 40 MG tablet Take 1 tablet by mouth daily.    . simvastatin (ZOCOR) 20 MG tablet Take 20 mg by mouth daily.    . hydrochlorothiazide (MICROZIDE) 12.5 MG capsule Take 1 capsule (12.5 mg total) by mouth daily. 90 capsule 3   No current facility-administered medications for this visit.     Allergies:   Statins    Social History:  The patient  reports that he quit smoking about 27 years ago. He has never used smokeless tobacco. He reports current alcohol use. He reports that he does not use drugs.   Family History:  The patient's family history includes Dementia in his father and mother.    ROS: All other systems are reviewed and negative. Unless otherwise mentioned in H&P    PHYSICAL EXAM: VS:  BP (!) 142/80   Pulse 61   Ht 5\' 8"  (1.727 m)   Wt 240 lb 6.4 oz (109 kg)  BMI 36.55 kg/m  , BMI Body mass index is 36.55 kg/m. GEN: Well nourished, well developed, in no acute distress, obese HEENT: normal Neck: no JVD, carotid bruits, or masses Cardiac: RRR; 1/6 systolic murmur, rubs, or gallops,no edema  Respiratory:  Clear to auscultation bilaterally, normal work of breathing GI: soft, nontender, nondistended, + BS, small ventral hernia.  MS: no deformity or atrophy Skin: warm and dry, no rash Neuro:  Strength and sensation are intact Psych: euthymic mood, full affect   EKG:  Not completed.   Recent Labs: No results found for requested labs within last 8760 hours.    Lipid Panel    Component Value Date/Time   CHOL 182 02/23/2017 0811   TRIG 140 02/23/2017 0811   HDL 66 02/23/2017 0811   CHOLHDL 2.8 02/23/2017 0811   LDLCALC 88 02/23/2017 0811      Wt Readings from Last 3 Encounters:  05/04/18 240 lb 6.4 oz (109  kg)  12/21/17 236 lb (107 kg)  02/17/17 234 lb 3.2 oz (106.2 kg)      Other studies Reviewed: Echocardiogram 01-19-18  Left ventricle: The cavity size was normal. There was mild focal   basal hypertrophy of the septum. Systolic function was normal.   The estimated ejection fraction was in the range of 55% to 60%.   Wall motion was normal; there were no regional wall motion   abnormalities. - Aortic valve: There was mild stenosis. There was trivial   regurgitation. Valve area (VTI): 1.93 cm^2. Valve area (Vmax):   1.85 cm^2. Valve area (Vmean): 1.75 cm^2. - Left atrium: The atrium was mildly dilated. - Atrial septum: No defect or patent foramen ovale was identified.  ASSESSMENT AND PLAN:  1. CAD:  Hx of CABG. He is asymptomatic for chest pain or worsening DOE. He will continue secondary prevention. Continue high dose ASA.   2. PAD: Hx of aortobifemoral bypass. No complaints of pain in the legs or intermittent claudication symptoms at this time.    3. Hypertension: Elevated today and not optimal for patient with CAD and PAD. He is on amlodipine and Benicar. I will add HCTZ 12. 5 mg daily to his regimen. This may assist with his LEE caused by CCB.  I will check a BMET.  4. OSA: Continue CPAP  5. Hypercholesterolemia: Continue statin therapy. He will have Lipids and LFTs drawn in one month when he returns for BMET.   6. Obesity: I have advised a 30 minute a day walk to increase his stamina, lose weight and improve his overall heath,   Current medicines are reviewed at length with the patient today.    Labs/ tests ordered today include: BMET and Lipids and LFT's.   Phill Myron. West Pugh, ANP, AACC   05/04/2018 12:29 PM    [address] Office [phone] Fax [phone]

## 2018-05-04 ENCOUNTER — Encounter: Payer: Self-pay | Admitting: Adult Health

## 2018-05-04 ENCOUNTER — Ambulatory Visit: Payer: Medicare Other | Admitting: Adult Health

## 2018-05-04 VITALS — BP 142/80 | HR 61 | Ht 68.0 in | Wt 240.4 lb

## 2018-05-04 DIAGNOSIS — Z79899 Other long term (current) drug therapy: Secondary | ICD-10-CM

## 2018-05-04 DIAGNOSIS — E7800 Pure hypercholesterolemia, unspecified: Secondary | ICD-10-CM | POA: Diagnosis not present

## 2018-05-04 DIAGNOSIS — I10 Essential (primary) hypertension: Secondary | ICD-10-CM | POA: Diagnosis not present

## 2018-05-04 MED ORDER — HYDROCHLOROTHIAZIDE 12.5 MG PO CAPS: 12.5000 mg | ORAL_CAPSULE | Freq: Every day | ORAL | 3 refills | Status: DC

## 2018-05-04 NOTE — Patient Instructions (Addendum)
Medication Instructions:  START HYDROCHLOROTHIAZIDE 12.5MG  DAILY If you need a refill on your cardiac medications before your next appointment, please call your pharmacy.  Labwork: BMET,LFT AND FLP-ASAP HERE IN OUR OFFICE AT [facility]   You will need to fast. DO NOT EAT OR DRINK PAST MIDNIGHT.       Take the provided lab slips with you to the lab for your blood draw.  When you have your labs (blood work) drawn today and your tests are completely normal, you will receive your results only by MyChart Message (if you have MyChart) -OR-  A paper copy in the mail.  If you have any lab test that is abnormal or we need to change your treatment, we will call you to review these results.  Follow-Up:  You will need a follow up appointment FOR NURSE VISIT in [place], [place] will need a follow up appointment in 6 months.  Please call our office 2 months (MAY 2020)   in advance to schedule this appointment (July 2020)  .  You may see Quay Burow, MD Jory Sims, DNP, AACC or one of the following Advanced Practice Providers on your designated Care Team:   Kerin Ransom, PA-C Roby Lofts, [place] . Sande Rives, PA-C    At [facility], you and your health needs are our priority.  As part of our continuing mission to provide you with exceptional heart care, we have created designated Provider Care Teams.  These Care Teams include your primary Cardiologist (physician) and Advanced Practice Providers (APPs -  Physician Assistants and Nurse Practitioners) who all work together to provide you with the care you need, when you need it.  Thank you for choosing [facility] at [facility]!!

## 2018-05-05 LAB — BASIC METABOLIC PANEL
BUN/Creatinine Ratio: 11 (ref 10–24)
BUN: 13 mg/dL (ref 8–27)
CO2: 23 mmol/L (ref 20–29)
Calcium: 9.1 mg/dL (ref 8.6–10.2)
Chloride: 100 mmol/L (ref 96–106)
Creatinine, Ser: 1.23 mg/dL (ref 0.76–1.27)
GFR calc Af Amer: 66 mL/min/{1.73_m2} (ref >59)
GFR calc non Af Amer: 57 mL/min/{1.73_m2} — ABNORMAL LOW (ref >59)
Glucose: 122 mg/dL — ABNORMAL HIGH (ref 65–99)
Potassium: 4.9 mmol/L (ref 3.5–5.2)
Sodium: 138 mmol/L (ref 134–144)

## 2018-05-05 LAB — HEPATIC FUNCTION PANEL
ALT: 25 IU/L (ref 0–44)
AST: 24 IU/L (ref 0–40)
Albumin: 4.2 g/dL (ref 3.5–4.8)
Alkaline Phosphatase: 89 IU/L (ref 39–117)
Bilirubin Total: 0.7 mg/dL (ref 0.0–1.2)
Bilirubin, Direct: 0.19 mg/dL (ref 0.00–0.40)
Total Protein: 6.7 g/dL (ref 6.0–8.5)

## 2018-05-05 LAB — LIPID PANEL
Chol/HDL Ratio: 2.8 ratio (ref 0.0–5.0)
Cholesterol, Total: 179 mg/dL (ref 100–199)
HDL: 64 mg/dL (ref >39)
LDL Calculated: 87 mg/dL (ref 0–99)
Triglycerides: 140 mg/dL (ref 0–149)
VLDL Cholesterol Cal: 28 mg/dL (ref 5–40)

## 2018-05-09 NOTE — Progress Notes (Signed)
NOT NEEDED

## 2018-05-10 ENCOUNTER — Telehealth: Payer: Self-pay | Admitting: Cardiovascular Disease

## 2018-05-10 NOTE — Telephone Encounter (Signed)
Patient returning call regarding lab results  ?

## 2018-05-11 ENCOUNTER — Other Ambulatory Visit: Payer: Self-pay

## 2018-05-11 MED ORDER — SIMVASTATIN 40 MG PO TABS: 40.0000 mg | ORAL_TABLET | Freq: Every day | ORAL | 0 refills | Status: DC

## 2018-06-01 ENCOUNTER — Ambulatory Visit (INDEPENDENT_AMBULATORY_CARE_PROVIDER_SITE_OTHER): Payer: Medicare Other

## 2018-06-01 VITALS — BP 138/71 | HR 51 | Ht 68.0 in | Wt 238.4 lb

## 2018-06-01 DIAGNOSIS — I10 Essential (primary) hypertension: Secondary | ICD-10-CM

## 2018-06-01 MED ORDER — AMLODIPINE BESYLATE 10 MG PO TABS: 10.0000 mg | ORAL_TABLET | Freq: Every day | ORAL | 0 refills | Status: DC

## 2018-06-01 MED ORDER — SIMVASTATIN 20 MG PO TABS: 20.0000 mg | ORAL_TABLET | Freq: Every day | ORAL | 1 refills | Status: DC

## 2018-06-01 MED ORDER — AMLODIPINE BESYLATE 10 MG PO TABS: 10.0000 mg | ORAL_TABLET | Freq: Every day | ORAL | 1 refills | Status: DC

## 2018-06-01 MED ORDER — OLMESARTAN MEDOXOMIL 40 MG PO TABS: 40.0000 mg | ORAL_TABLET | Freq: Every day | ORAL | 0 refills | Status: DC

## 2018-06-01 MED ORDER — OLMESARTAN MEDOXOMIL 40 MG PO TABS: 40.0000 mg | ORAL_TABLET | Freq: Every day | ORAL | 1 refills | Status: DC

## 2018-06-01 MED ORDER — SIMVASTATIN 20 MG PO TABS: 20.0000 mg | ORAL_TABLET | Freq: Every day | ORAL | 0 refills | Status: DC

## 2018-06-01 NOTE — Progress Notes (Signed)
Pt is here for BP check for medication follow up. He expresses coordfination and cognitive issues with his simvastatin. His weight is down 2# from last OV. He is having issues with his pharmacies filling his medications. He is quite a bit confused relaying this information to me. He states that his PCP wants to change his Olmesartan but, I really could not understand why, he was not able to really relay the PCP's reasoning to me.  Pt brings BP/WT log with notes from his "fit bit" included. He takes his BP in both arm with these number on his log. Pt states that he can "feel" the fluid loss-as his rings are no longer "tight feeling" [place] notified of the pt reported comments. She will adjust the Simvastatin back down to 20mg  daily and follow up in 1 month with her. Pt notified of these(KL's) directions and taken to the check-out desk to make this follow up appointment.

## 2018-06-01 NOTE — Patient Instructions (Signed)
Medication Instructions:  DECREASE SIMVASTATIN 20MG  DAILY If you need a refill on your cardiac medications before your next appointment, please call your pharmacy.  Special Instructions: CONTINUE TO TAKE AND LOG BP DAILY  Follow-Up: You will need a follow up appointment in 1 Maia Breslow, [place], [place].   At [facility], you and your health needs are our priority.  As part of our continuing mission to provide you with exceptional heart care, we have created designated Provider Care Teams.  These Care Teams include your primary Cardiologist (physician) and Advanced Practice Providers (APPs -  Physician Assistants and Nurse Practitioners) who all work together to provide you with the care you need, when you need it.  Thank you for choosing [facility] at [facility]!!

## 2018-06-04 ENCOUNTER — Other Ambulatory Visit: Payer: Self-pay | Admitting: Adult Health

## 2018-06-09 ENCOUNTER — Telehealth: Payer: Self-pay | Admitting: Cardiovascular Disease

## 2018-06-09 ENCOUNTER — Other Ambulatory Visit: Payer: Self-pay | Admitting: *Deleted

## 2018-06-09 MED ORDER — AMLODIPINE BESYLATE 10 MG PO TABS: 10.0000 mg | ORAL_TABLET | Freq: Every day | ORAL | 1 refills | Status: DC

## 2018-06-09 NOTE — Telephone Encounter (Signed)
  Pt c/o medication issue:  1. Name of Medication: amLODipine (NORVASC) 10 MG tablet  2. How are you currently taking this medication (dosage and times per day)? Take 1 tablet (10 mg total) by mouth daily  3. Are you having a reaction (difficulty breathing--STAT)?NA  4. What is your medication issue? Pharmacy has two scripts for this medication. They have one for 5 mg that Dr Harrington Challenger wrote and this one for 10 mg. They need clarification on which they should refill  Ref #[id number]

## 2018-06-16 ENCOUNTER — Other Ambulatory Visit: Payer: Self-pay | Admitting: Adult Health

## 2018-06-20 ENCOUNTER — Other Ambulatory Visit: Payer: Self-pay | Admitting: Adult Health

## 2018-07-04 NOTE — Progress Notes (Signed)
Cardiology Office Note   Date:  07/06/2018   ID:  Johnny [name], Johnny [name] 05/23/1945, MRN [id number]  PCP:  Lawerance Cruel, MD  Cardiologist:  Dr. Gwenlyn Found  Chief Complaint  Patient presents with  . Hypertension  . Coronary Artery Disease     History of Present Illness: Johnny [name] is a 72 y.o. male who presents for ongoing assessment and management of CAD s/p CABG in 1992, AAA and aortobifemoral bypass in 2005, most recent myoview stress test in 06/2017 revealed inferolateral scar without ischemia, EF of 50%. Was last seen by  Dr.Berry on 12/21/2017 and was clinically stable.  He was to be scheduled for lipids and LFTs, and BMET.  He has been seen by his PCP, Dr. Harrington Challenger and started on metformin 500 mg at [facility] for type II diabetes. He is also to take 81 mg of ASA daily. He has had recent labs which the patient brings with him.  Review of labs demonstrates creatinine slightly elevated at 1.43, BUN 27, normal LFT's Hgb A1c 6.5, average glucose of 140.  TC 180. TG  97, LDL 103. He was unable to tolerate simvastatin 40 mg dose due to confusion. He was reduced to 20 mg daily and has had resolution of symptoms.   He is trying to lose weight and exercise more. Due to LE PAD, he had some trouble with walking long distances but is trying to work his way up to longer periods of exercise.  He has eliminated sodium from his diet. He states this is hard but he is working on it.   He is asymptomatic currently. He denies issues with institution of metformin. He is compliant with CPAP.   Past Medical History:  Diagnosis Date  . Abdominal aortic aneurysm ([place])   . Cardiac murmur   . Coronary artery disease   . Heart attack ([place])   . Hemorrhoids    Occasional bleeding   . Hiatal hernia   . History of myocardial infarction 1992  . History of rheumatic fever   . Hyperlipidemia   . Hypertension   . Obstructive sleep apnea   . Osteoarthritis of knee    Right knee  . Peripheral arterial disease ([place])    status post aortobifemoral bypass grafting by Dr. Kellie Simmering in 2005  . Sleep apnea     Past Surgical History:  Procedure Laterality Date  . ABDOMINAL AORTIC ANEURYSM REPAIR  2005  . CARDIAC CATHETERIZATION  1992   followed by open heart coronary artery bypass grafting of 4 vessels post MI  . CHOLECYSTECTOMY    . CORONARY ARTERY BYPASS GRAFT    . REPLACEMENT TOTAL KNEE     Right knee cartilage surgery in 1976     Current Outpatient Medications  Medication Sig Dispense Refill  . amLODipine (NORVASC) 10 MG tablet Take 1 tablet (10 mg total) by mouth daily. 90 tablet 1  . aspirin 325 MG tablet Take 325 mg by mouth daily.    . famotidine (PEPCID) 40 MG tablet Take 40 mg by mouth daily.    Marland Kitchen latanoprost (XALATAN) 0.005 % ophthalmic solution 1 drop at bedtime.    . metFORMIN (GLUCOPHAGE) 500 MG tablet Take 500 mg by mouth daily.    Marland Kitchen olmesartan (BENICAR) 40 MG tablet Take 1 tablet (40 mg total) by mouth daily. 90 tablet 1  . simvastatin (ZOCOR) 20 MG tablet Take 1 tablet (20 mg total) by mouth daily. 90 tablet 1  . chlorthalidone (HYGROTON) 25 MG tablet Take  0.5 tablets (12.5 mg total) by mouth daily. 45 tablet 3   No current facility-administered medications for this visit.     Allergies:   Statins    Social History:  The patient  reports that he quit smoking about 27 years ago. He has never used smokeless tobacco. He reports current alcohol use. He reports that he does not use drugs.   Family History:  The patient's family history includes Dementia in his father and mother.    ROS: All other systems are reviewed and negative. Unless otherwise mentioned in H&P    PHYSICAL EXAM: VS:  BP (!) 148/72   Pulse 66   Ht 5\' 8"  (1.727 m)   Wt 238 lb 3.2 oz (108 kg)   SpO2 94%   BMI 36.22 kg/m  , BMI Body mass index is 36.22 kg/m. GEN: Well nourished, well developed, in no acute distress HEENT: normal Neck: no JVD, carotid bruits, or masses Cardiac: RRR; 2/6 systolic murmurs, rubs,  or gallops,no edema  Respiratory:  Clear to auscultation bilaterally, normal work of breathing GI: soft, nontender, nondistended, + BS MS: no deformity or atrophy Skin: warm and dry, no rash Neuro:  Strength and sensation are intact Psych: euthymic mood, full affect   EKG: Not completed this office visit.  Recent Labs: 05/05/2018: ALT 25; BUN 13; Creatinine, Ser 1.23; Potassium 4.9; Sodium 138    Lipid Panel    Component Value Date/Time   CHOL 179 05/05/2018 0829   TRIG 140 05/05/2018 0829   HDL 64 05/05/2018 0829   CHOLHDL 2.8 05/05/2018 0829   LDLCALC 87 05/05/2018 0829      Wt Readings from Last 3 Encounters:  07/06/18 238 lb 3.2 oz (108 kg)  06/01/18 238 lb 6.4 oz (108.1 kg)  05/04/18 240 lb 6.4 oz (109 kg)    Other studies Reviewed: Echo 12/29/2017  Left ventricle: The cavity size was normal. There was mild focal   basal hypertrophy of the septum. Systolic function was normal.   The estimated ejection fraction was in the range of 55% to 60%.   Wall motion was normal; there were no regional wall motion   abnormalities. - Aortic valve: There was mild stenosis. There was trivial   regurgitation. Valve area (VTI): 1.93 cm^2. Valve area (Vmax):   1.85 cm^2. Valve area (Vmean): 1.75 cm^2. - Left atrium: The atrium was mildly dilated. - Atrial septum: No defect or patent foramen ovale was identified.  ASSESSMENT AND PLAN:  1. CAD: Hx of CABG. He denies any further symptoms at this time. If he becomes symptomatic, low threshold for requesting cardiac cath with known heart disease and multiple risk factors. Consider addition of Januvia for cardioprotection in patient with diabetes.  2. Hypertension; Not optimal for patient with diabetes and CAD. I will take him off of HCTZ and begin chlorthalidone 12.5 mg daily. He is to continue to avoid salt, lose weight and increase exercise. Do not want to add an additional medication at this time. He is to see PCP on follow up soon. BP  will be checked at that time.   3. Hypercholesterolemia: LDL is not at goal He was unable to tolerate higher dose of simvastatin due to confusion, with resolution of these symptoms once dose is reduced. Recommend starting Zetia. He may need to be referred to lipid clinic for institution of PCSK-9 inhibitor if LDL does not come down to goal of < 70.   4. Newly Diagnosed Type II Diabetes: Being followed by  his PCP, started on metformin 500 mg at HS. Close follow up planned.   5. PAD: Hx of aortobifemoral bypass in 2005. Continues to be a barrier to walking long distances but he is willing to try and increase his activities.   Current medicines are reviewed at length with the patient today.    Labs/ tests ordered today include: None  Phill Myron. West Pugh, ANP, [facility]   07/06/2018 10:48 AM    [facility] [place] Office [phone] Fax [phone]

## 2018-07-06 ENCOUNTER — Other Ambulatory Visit: Payer: Self-pay

## 2018-07-06 ENCOUNTER — Encounter: Payer: Self-pay | Admitting: Adult Health

## 2018-07-06 ENCOUNTER — Ambulatory Visit: Payer: Medicare Other | Admitting: Adult Health

## 2018-07-06 VITALS — BP 148/72 | HR 66 | Ht 68.0 in | Wt 238.2 lb

## 2018-07-06 DIAGNOSIS — I739 Peripheral vascular disease, unspecified: Secondary | ICD-10-CM

## 2018-07-06 DIAGNOSIS — I10 Essential (primary) hypertension: Secondary | ICD-10-CM

## 2018-07-06 DIAGNOSIS — E7800 Pure hypercholesterolemia, unspecified: Secondary | ICD-10-CM

## 2018-07-06 DIAGNOSIS — E1169 Type 2 diabetes mellitus with other specified complication: Secondary | ICD-10-CM

## 2018-07-06 DIAGNOSIS — I2510 Atherosclerotic heart disease of native coronary artery without angina pectoris: Secondary | ICD-10-CM | POA: Diagnosis not present

## 2018-07-06 DIAGNOSIS — Z794 Long term (current) use of insulin: Secondary | ICD-10-CM

## 2018-07-06 MED ORDER — CHLORTHALIDONE 25 MG PO TABS: 12.5000 mg | ORAL_TABLET | Freq: Every day | ORAL | 3 refills | Status: DC

## 2018-07-06 NOTE — Patient Instructions (Signed)
Follow-Up: You will need a follow up appointment in 6 months.  Please call our office 2 months in advance, July 2020 to schedule this September 2020 appointment.  You may see Quay Burow, MD or one of the following Advanced Practice Providers on your designated Care Team:   Kerin Ransom, [place] Roby Lofts, PA-C Sande Rives, [place]      Medication Instructions:  STOP HYDROCHLOROTHIAZIDE START CHLORTHALIDONE 12.5MG  (1/2 TAB DAILY) Your physician recommends that you continue on your current medications as directed. Please refer to the Current Medication list given to you today. If you need a refill on your cardiac medications before your next appointment, please call your pharmacy. Labwork: When you have labs (blood work) and your tests are completely normal, you will receive your results ONLY by [place] (if you have MyChart) -OR- A paper copy in the mail.  At [facility], you and your health needs are our priority.  As part of our continuing mission to provide you with exceptional heart care, we have created designated Provider Care Teams.  These Care Teams include your primary Cardiologist (physician) and Advanced Practice Providers (APPs -  Physician Assistants and Nurse Practitioners) who all work together to provide you with the care you need, when you need it.  Thank you for choosing [facility] at [facility]!!

## 2018-07-14 ENCOUNTER — Telehealth: Payer: Self-pay | Admitting: Cardiovascular Disease

## 2018-07-14 DIAGNOSIS — Z5181 Encounter for therapeutic drug level monitoring: Secondary | ICD-10-CM

## 2018-07-14 MED ORDER — CHLORTHALIDONE 25 MG PO TABS: 25.0000 mg | ORAL_TABLET | ORAL | 0 refills | Status: DC

## 2018-07-14 NOTE — Telephone Encounter (Signed)
Okay to take 25mg  every other day.  Monitor BP daily and repeat BMET in 2- 3 weeks.

## 2018-07-14 NOTE — Telephone Encounter (Signed)
Advised patient verbalized understanding. Mailed lab orders to patient

## 2018-07-14 NOTE — Telephone Encounter (Signed)
Patient was prescribed Chlorthalidone 25 mg 1/2 tablet daily and pill is too small to cut. Will forward to Pharm D for review

## 2018-07-14 NOTE — Telephone Encounter (Signed)
° ° °  Pt c/o medication issue:  1. Name of Medication: chlorthalidone (HYGROTON) 25 MG tablet  2. How are you currently taking this medication (dosage and times per day)? As written  3. Are you having a reaction (difficulty breathing--STAT)? no  4. What is your medication issue? Pill is too tiny to cut in half

## 2018-08-05 LAB — BASIC METABOLIC PANEL WITH GFR
BUN/Creatinine Ratio: 13 (ref 10–24)
BUN: 18 mg/dL (ref 8–27)
CO2: 20 mmol/L (ref 20–29)
Calcium: 9.4 mg/dL (ref 8.6–10.2)
Chloride: 97 mmol/L (ref 96–106)
Creatinine, Ser: 1.35 mg/dL — ABNORMAL HIGH (ref 0.76–1.27)
GFR calc Af Amer: 59 mL/min/1.73 — ABNORMAL LOW
GFR calc non Af Amer: 51 mL/min/1.73 — ABNORMAL LOW
Glucose: 112 mg/dL — ABNORMAL HIGH (ref 65–99)
Potassium: 4.7 mmol/L (ref 3.5–5.2)
Sodium: 135 mmol/L (ref 134–144)

## 2018-08-08 ENCOUNTER — Other Ambulatory Visit: Payer: Self-pay

## 2018-08-08 DIAGNOSIS — R799 Abnormal finding of blood chemistry, unspecified: Secondary | ICD-10-CM

## 2018-08-24 ENCOUNTER — Other Ambulatory Visit: Payer: Self-pay

## 2018-08-24 DIAGNOSIS — R799 Abnormal finding of blood chemistry, unspecified: Secondary | ICD-10-CM

## 2018-08-30 ENCOUNTER — Telehealth: Payer: Self-pay | Admitting: Cardiovascular Disease

## 2018-08-30 NOTE — Telephone Encounter (Signed)
Patient would like lab results from 08/04/18.  Please call

## 2018-08-30 NOTE — Telephone Encounter (Signed)
Patient called, given results.  No other questions or concerns.

## 2018-10-18 ENCOUNTER — Telehealth: Payer: Self-pay

## 2018-10-18 NOTE — Telephone Encounter (Signed)
Called pt and informed that pt Dr. Gwenlyn Found will be in office on scheduled appointment date 7/1. Appointment changed back to in office and pt made aware.

## 2018-10-25 ENCOUNTER — Telehealth: Payer: Self-pay | Admitting: Cardiovascular Disease

## 2018-10-25 NOTE — Telephone Encounter (Signed)

## 2018-10-26 ENCOUNTER — Other Ambulatory Visit: Payer: Self-pay

## 2018-10-26 ENCOUNTER — Ambulatory Visit (INDEPENDENT_AMBULATORY_CARE_PROVIDER_SITE_OTHER): Payer: Medicare Other | Admitting: Cardiovascular Disease

## 2018-10-26 ENCOUNTER — Encounter: Payer: Self-pay | Admitting: Cardiovascular Disease

## 2018-10-26 DIAGNOSIS — I739 Peripheral vascular disease, unspecified: Secondary | ICD-10-CM

## 2018-10-26 DIAGNOSIS — I2510 Atherosclerotic heart disease of native coronary artery without angina pectoris: Secondary | ICD-10-CM | POA: Diagnosis not present

## 2018-10-26 DIAGNOSIS — G4733 Obstructive sleep apnea (adult) (pediatric): Secondary | ICD-10-CM | POA: Diagnosis not present

## 2018-10-26 DIAGNOSIS — I10 Essential (primary) hypertension: Secondary | ICD-10-CM

## 2018-10-26 DIAGNOSIS — E782 Mixed hyperlipidemia: Secondary | ICD-10-CM | POA: Diagnosis not present

## 2018-10-26 NOTE — Assessment & Plan Note (Signed)
History of essential hypertension with blood pressure measured today 134/72.  He is on amlodipine, Benicar and chlorthalidone.

## 2018-10-26 NOTE — Assessment & Plan Note (Signed)
History obstructive sleep apnea on CPAP

## 2018-10-26 NOTE — Assessment & Plan Note (Signed)
History of CAD status post CABG by Dr. Servando Snare 1992.  His last Myoview performed in 2014 was nonischemic.  He denies chest pain or shortness of breath.

## 2018-10-26 NOTE — Assessment & Plan Note (Signed)
History of hyperlipidemia on simvastatin 20 mg a day with lipid profile performed 06/28/2018 revealing total cholesterol 180, LDL of 113 and HDL 58.  This is slightly higher than prior lipid profiles here.  He does admit to some dietary indiscretion.  We will recheck a lipid liver profile in 3 months prior to determining whether to increase his statin dosing.

## 2018-10-26 NOTE — Progress Notes (Signed)
10/26/2018 [name]   1946/04/17  [id number]  Primary Physician Lawerance Cruel, MD Primary Cardiologist: Lorretta Harp MD FACP, [place], [place], [place]  HPI:  Johnny [name] is a 73 y.o.  mildly overweight, married Caucasian male, father of 42, grandfather to 3 grandchildren who I last saw  12/21/2017. He has a history of CAD status post coronary artery bypass grafting in 1992 by Dr. Ceasar Mons. He also had aortobifemoral bypass grafting secondary to abdominal aortic aneurysm done by Dr. Mamie Nick in 2005. His other problems include treated hypertension, dyslipidemia, and obstructive sleep apneaon CPAP which he wears and benefits from.He denies chest pain or shortness of breath. He had an uncomplicated right total knee replacement performed by Dr. Gaynelle Arabian July of last year. Myoview stress test performed March 2012 was nonischemic and carotid Dopplers were normal as well. He had a Myoview stress test performed in March of this year that showed inferolateral scar without ischemia with an EF in the 50% range. He is asymptomatic. His lipid profile followed by his primary care physician. Because of mild lifestyle limiting claudication lower extremity arterial Dopplers were performed in our office on 05/03/13 which revealed a patent aortobifemoral bypass graft, severe SFA disease bilaterally with ABIs in the 0.87 range. Since I saw him back one year ago he has remained chronically stable. He still has mild claudication. He has changed his diet dramatically after reading several books recommended by his primary care doctor including Eat To Live by Dr. Baker Janus.     He has noticed some slight increasing dyspnea when walking up an incline especially when playing 9 holes of golf or walking up multiple flights of stairs carrying heavy objects.  He denies chest pain.  He has mild lifestyle limiting claudication.  His weight has remained stable.  He is not working out regularly like he did  several years ago..  Since I saw him a year ago he is remained stable.  He did have a 2D echocardiogram which I performed because of some dyspnea 12/29/2017 revealing normal LV systolic function with mild aortic stenosis.  He continues to complain of some lifestyle limiting claudication.   Current Meds  Medication Sig  . amLODipine (NORVASC) 10 MG tablet Take 1 tablet (10 mg total) by mouth daily.  Marland Kitchen aspirin 325 MG tablet Take 325 mg by mouth daily.  . famotidine (PEPCID) 40 MG tablet Take 40 mg by mouth daily.  Marland Kitchen latanoprost (XALATAN) 0.005 % ophthalmic solution 1 drop at bedtime.  . metFORMIN (GLUCOPHAGE) 500 MG tablet Take 500 mg by mouth daily.  Marland Kitchen olmesartan (BENICAR) 40 MG tablet Take 1 tablet (40 mg total) by mouth daily.  . simvastatin (ZOCOR) 20 MG tablet Take 1 tablet (20 mg total) by mouth daily.     Allergies  Allergen Reactions  . Statins Other (See Comments)    Intolerance     Social History   Socioeconomic History  . Marital status: Married    Spouse name: Not on file  . Number of children: Not on file  . Years of education: Not on file  . Highest education level: Not on file  Occupational History  . Not on file  Social Needs  . Financial resource strain: Not on file  . Food insecurity    Worry: Not on file    Inability: Not on file  . Transportation needs    Medical: Not on file    Non-medical: Not on file  Tobacco Use  . Smoking status: Former Smoker    Quit date: 08/26/1990    Years since quitting: 28.1  . Smokeless tobacco: Never Used  Substance and Sexual Activity  . Alcohol use: Yes    Comment: occassional   . Drug use: No  . Sexual activity: Not Currently  Lifestyle  . Physical activity    Days per week: Not on file    Minutes per session: Not on file  . Stress: Not on file  Relationships  . Social Herbalist on phone: Not on file    Gets together: Not on file    Attends religious service: Not on file    Active member of club or  organization: Not on file    Attends meetings of clubs or organizations: Not on file    Relationship status: Not on file  . Intimate partner violence    Fear of current or ex partner: Not on file    Emotionally abused: Not on file    Physically abused: Not on file    Forced sexual activity: Not on file  Other Topics Concern  . Not on file  Social History Narrative  . Not on file     Review of Systems: General: negative for chills, fever, night sweats or weight changes.  Cardiovascular: negative for chest pain, dyspnea on exertion, edema, orthopnea, palpitations, paroxysmal nocturnal dyspnea or shortness of breath Dermatological: negative for rash Respiratory: negative for cough or wheezing Urologic: negative for hematuria Abdominal: negative for nausea, vomiting, diarrhea, bright red blood per rectum, melena, or hematemesis Neurologic: negative for visual changes, syncope, or dizziness All other systems reviewed and are otherwise negative except as noted above.    Blood pressure 134/72, pulse 67, temperature 98.1 F (36.7 C), height 5\' 8"  (1.727 m), weight 232 lb (105.2 kg).  General appearance: alert and no distress Neck: no adenopathy, no carotid bruit, no JVD, supple, symmetrical, trachea midline and thyroid not enlarged, symmetric, no tenderness/mass/nodules Lungs: clear to auscultation bilaterally Heart: regular rate and rhythm, S1, S2 normal, no murmur, click, rub or gallop Extremities: extremities normal, atraumatic, no cyanosis or edema Pulses: 2+ and symmetric Diminished pedal pulses Skin: Skin color, texture, turgor normal. No rashes or lesions Neurologic: Alert and oriented X 3, normal strength and tone. Normal symmetric reflexes. Normal coordination and gait  EKG normal sinus rhythm at 67 with septal Q waves and nonspecific ST and T wave changes.  I personally reviewed this EKG.  ASSESSMENT AND PLAN:   Coronary artery disease History of CAD status post CABG by  Dr. Servando Snare 1992.  His last Myoview performed in 2014 was nonischemic.  He denies chest pain or shortness of breath.  Essential hypertension History of essential hypertension with blood pressure measured today 134/72.  He is on amlodipine, Benicar and chlorthalidone.  Hyperlipidemia History of hyperlipidemia on simvastatin 20 mg a day with lipid profile performed 06/28/2018 revealing total cholesterol 180, LDL of 113 and HDL 58.  This is slightly higher than prior lipid profiles here.  He does admit to some dietary indiscretion.  We will recheck a lipid liver profile in 3 months prior to determining whether to increase his statin dosing.  Obstructive sleep apnea History obstructive sleep apnea on CPAP  Peripheral arterial disease History of peripheral arterial disease-status post aortobifemoral bypass grafting in 2005 by Dr. Mamie Nick for abdominal aortic aneurysm.  His last Doppler studies performed in 2015 revealed a patent graft with severe diffuse SFA  disease.  Does complain of some lifestyle and claudication.      Lorretta Harp MD FACP,FACC,FAHA, [facility] 10/26/2018 4:27 PM

## 2018-10-26 NOTE — Patient Instructions (Signed)
Medication Instructions:  Your physician recommends that you continue on your current medications as directed. Please refer to the Current Medication list given to you today.  If you need a refill on your cardiac medications before your next appointment, please call your pharmacy.   Lab work: Your physician recommends that you return for lab work in 3 MONTHS: [place]  If you have labs (blood work) drawn today and your tests are completely normal, you will receive your results only by: [place] MyChart Message (if you have MyChart) OR . A paper copy in the mail If you have any lab test that is abnormal or we need to change your treatment, we will call you to review the results.  Testing/Procedures:  Your physician has requested that you have an aorta/iliac duplex. During this test, an ultrasound is used to evaluate blood flow to the aorta and iliac arteries. Allow one hour for this exam. Do not eat after midnight the day before and avoid carbonated beverages.  Your physician has requested that you have an ankle brachial index (ABI). During this test an ultrasound and blood pressure cuff are used to evaluate the arteries that supply the arms and legs with blood. Allow thirty minutes for this exam. There are no restrictions or special instructions.    Follow-Up: At [facility], you and your health needs are our priority.  As part of our continuing mission to provide you with exceptional heart care, we have created designated Provider Care Teams.  These Care Teams include your primary Cardiologist (physician) and Advanced Practice Providers (APPs -  Physician Assistants and Nurse Practitioners) who all work together to provide you with the care you need, when you need it. You will need a follow up appointment in 6 months [place].  Please call our office 2 months in advance to schedule this appointment.

## 2018-10-26 NOTE — Assessment & Plan Note (Signed)
History of peripheral arterial disease-status post aortobifemoral bypass grafting in 2005 by Dr. Mamie Nick for abdominal aortic aneurysm.  His last Doppler studies performed in 2015 revealed a patent graft with severe diffuse SFA disease.  Does complain of some lifestyle and claudication.

## 2018-11-23 ENCOUNTER — Other Ambulatory Visit: Payer: Self-pay

## 2018-11-23 ENCOUNTER — Ambulatory Visit (HOSPITAL_COMMUNITY)
Admission: RE | Admit: 2018-11-23 | Discharge: 2018-11-23 | Disposition: A | Discharge status: Home / Self Care | Payer: Medicare Other | Source: Ambulatory Visit | Attending: Internal Medicine | Admitting: Internal Medicine

## 2018-11-23 ENCOUNTER — Ambulatory Visit (HOSPITAL_BASED_OUTPATIENT_CLINIC_OR_DEPARTMENT_OTHER)
Admission: RE | Admit: 2018-11-23 | Discharge: 2018-11-23 | Disposition: A | Discharge status: Home / Self Care | Payer: Medicare Other | Source: Ambulatory Visit | Attending: Internal Medicine | Admitting: Internal Medicine

## 2018-11-23 DIAGNOSIS — I739 Peripheral vascular disease, unspecified: Secondary | ICD-10-CM | POA: Insufficient documentation

## 2018-12-08 ENCOUNTER — Telehealth: Payer: Self-pay | Admitting: Cardiovascular Disease

## 2018-12-08 NOTE — Telephone Encounter (Signed)
New Message   Patient is calling to obtain the results of his aorta study. Please call.

## 2018-12-08 NOTE — Telephone Encounter (Signed)
Pt updated with test results and verbalized understanding.  

## 2019-01-26 LAB — LIPID PANEL
Chol/HDL Ratio: 2.4 ratio (ref 0.0–5.0)
Cholesterol, Total: 173 mg/dL (ref 100–199)
HDL: 73 mg/dL (ref >39)
LDL Chol Calc (NIH): 85 mg/dL (ref 0–99)
Triglycerides: 79 mg/dL (ref 0–149)
VLDL Cholesterol Cal: 15 mg/dL (ref 5–40)

## 2019-01-26 LAB — HEPATIC FUNCTION PANEL
ALT: 18 IU/L (ref 0–44)
AST: 21 IU/L (ref 0–40)
Albumin: 4.2 g/dL (ref 3.7–4.7)
Alkaline Phosphatase: 85 IU/L (ref 39–117)
Bilirubin Total: 0.9 mg/dL (ref 0.0–1.2)
Bilirubin, Direct: 0.24 mg/dL (ref 0.00–0.40)
Total Protein: 6.9 g/dL (ref 6.0–8.5)

## 2019-01-30 ENCOUNTER — Other Ambulatory Visit: Payer: Self-pay

## 2019-01-30 DIAGNOSIS — E7800 Pure hypercholesterolemia, unspecified: Secondary | ICD-10-CM

## 2019-01-30 MED ORDER — EZETIMIBE 10 MG PO TABS: 10.0000 mg | ORAL_TABLET | Freq: Every day | ORAL | 3 refills | Status: DC

## 2019-04-25 ENCOUNTER — Other Ambulatory Visit: Payer: Self-pay | Admitting: Adult Health

## 2019-05-18 ENCOUNTER — Telehealth: Payer: Self-pay | Admitting: *Deleted

## 2019-05-18 NOTE — Telephone Encounter (Signed)
A message was left, re: his follow up appointment.

## 2019-05-19 ENCOUNTER — Encounter: Payer: Self-pay | Admitting: Cardiovascular Disease

## 2019-05-19 ENCOUNTER — Other Ambulatory Visit: Payer: Self-pay

## 2019-05-19 ENCOUNTER — Ambulatory Visit: Payer: Medicare Other | Admitting: Cardiovascular Disease

## 2019-05-19 DIAGNOSIS — G4733 Obstructive sleep apnea (adult) (pediatric): Secondary | ICD-10-CM

## 2019-05-19 DIAGNOSIS — I2510 Atherosclerotic heart disease of native coronary artery without angina pectoris: Secondary | ICD-10-CM

## 2019-05-19 DIAGNOSIS — I350 Nonrheumatic aortic (valve) stenosis: Secondary | ICD-10-CM

## 2019-05-19 DIAGNOSIS — I739 Peripheral vascular disease, unspecified: Secondary | ICD-10-CM

## 2019-05-19 DIAGNOSIS — E782 Mixed hyperlipidemia: Secondary | ICD-10-CM

## 2019-05-19 NOTE — Assessment & Plan Note (Signed)
History of hyperlipidemia on statin therapy followed by his PCP 

## 2019-05-19 NOTE — Assessment & Plan Note (Signed)
History of obstructive sleep apnea on CPAP. 

## 2019-05-19 NOTE — Progress Notes (Signed)
05/19/2019 Johnny [name]   08/19/1945  [id number]  Primary Physician Lawerance Cruel, MD Primary Cardiologist: Lorretta Harp MD FACP, [place], [place], [place]  HPI:  Johnny [name] is a 74 y.o.   mildly overweight, married Caucasian male, father of 2, grandfather to 3 grandchildren who I last saw  10/26/2018. He has a history of CAD status post coronary artery bypass grafting in 1992 by Dr. Ceasar Mons. He also had aortobifemoral bypass grafting secondary to abdominal aortic aneurysm done by Dr. Mamie Nick in 2005. His other problems include treated hypertension, dyslipidemia, and obstructive sleep apneaon CPAP which he wears and benefits from.He denies chest pain or shortness of breath. He had an uncomplicated right total knee replacement performed by Dr. Gaynelle Arabian July of last year. Myoview stress test performed March 2012 was nonischemic and carotid Dopplers were normal as well. He had a Myoview stress test performed in March of this year that showed inferolateral scar without ischemia with an EF in the 50% range. He is asymptomatic. His lipid profile followed by his primary care physician. Because of mild lifestyle limiting claudication lower extremity arterial Dopplers were performed in our office on 05/03/13 which revealed a patent aortobifemoral bypass graft, severe SFA disease bilaterally with ABIs in the 0.87 range. Since I saw him back one year ago he has remained chronically stable. He still has mild claudication. He has changed his diet dramatically after reading several books recommended by his primary care doctor including Eat To Live by Dr. Baker Janus.   He has noticed some slight increasing dyspnea when walking up an incline especially when playing 9 holes of golf or walking up multiple flights of stairs carrying heavy objects. He denies chest pain. He has mild lifestyle limiting claudication. His weight has remained stable. He is not working out regularly like he did  several years ago..  Since I saw him a year and a half ago he is remained stable.  He continues to walk and has mild limitation with claudication.  Otherwise he denies chest pain or shortness of breath.   Current Meds  Medication Sig  . amLODipine (NORVASC) 10 MG tablet Take 1 tablet (10 mg total) by mouth daily.  Marland Kitchen aspirin 325 MG tablet Take 85 mg by mouth daily.   . chlorthalidone (HYGROTON) 25 MG tablet TAKE 1/2 TABLET BY MOUTH  DAILY  . clobetasol (TEMOVATE) 0.05 % external solution APPLY TO AFFECTED AREA AS DIRECTED  . ezetimibe (ZETIA) 10 MG tablet Take 1 tablet (10 mg total) by mouth daily.  . famotidine (PEPCID) 40 MG tablet Take 40 mg by mouth daily.  Marland Kitchen latanoprost (XALATAN) 0.005 % ophthalmic solution 1 drop at bedtime.  . metFORMIN (GLUCOPHAGE) 500 MG tablet Take 500 mg by mouth daily.  Marland Kitchen olmesartan (BENICAR) 40 MG tablet Take 1 tablet (40 mg total) by mouth daily.  . simvastatin (ZOCOR) 20 MG tablet Take 1 tablet (20 mg total) by mouth daily.     Allergies  Allergen Reactions  . Statins Other (See Comments)    Intolerance     Social History   Socioeconomic History  . Marital status: Married    Spouse name: Not on file  . Number of children: Not on file  . Years of education: Not on file  . Highest education level: Not on file  Occupational History  . Not on file  Tobacco Use  . Smoking status: Former Smoker    Quit date: 08/26/1990  Years since quitting: 28.7  . Smokeless tobacco: Never Used  Substance and Sexual Activity  . Alcohol use: Yes    Comment: occassional   . Drug use: No  . Sexual activity: Not Currently  Other Topics Concern  . Not on file  Social History Narrative  . Not on file   Social Determinants of Health   Financial Resource Strain:   . Difficulty of Paying Living Expenses: Not on file  Food Insecurity:   . Worried About Charity fundraiser in the Last Year: Not on file  . Ran Out of Food in the Last Year: Not on file   Transportation Needs:   . Lack of Transportation (Medical): Not on file  . Lack of Transportation (Non-Medical): Not on file  Physical Activity:   . Days of Exercise per Week: Not on file  . Minutes of Exercise per Session: Not on file  Stress:   . Feeling of Stress : Not on file  Social Connections:   . Frequency of Communication with Friends and Family: Not on file  . Frequency of Social Gatherings with Friends and Family: Not on file  . Attends Religious Services: Not on file  . Active Member of Clubs or Organizations: Not on file  . Attends Archivist Meetings: Not on file  . Marital Status: Not on file  Intimate Partner Violence:   . Fear of Current or Ex-Partner: Not on file  . Emotionally Abused: Not on file  . Physically Abused: Not on file  . Sexually Abused: Not on file     Review of Systems: General: negative for chills, fever, night sweats or weight changes.  Cardiovascular: negative for chest pain, dyspnea on exertion, edema, orthopnea, palpitations, paroxysmal nocturnal dyspnea or shortness of breath Dermatological: negative for rash Respiratory: negative for cough or wheezing Urologic: negative for hematuria Abdominal: negative for nausea, vomiting, diarrhea, bright red blood per rectum, melena, or hematemesis Neurologic: negative for visual changes, syncope, or dizziness All other systems reviewed and are otherwise negative except as noted above.    Blood pressure 139/72, pulse 62, temperature (!) 97 F (36.1 C), height 5\' 8"  (1.727 m), weight 233 lb 9.6 oz (106 kg), SpO2 96 %.  General appearance: alert and no distress Neck: no adenopathy, no carotid bruit, no JVD, supple, symmetrical, trachea midline and thyroid not enlarged, symmetric, no tenderness/mass/nodules Lungs: clear to auscultation bilaterally Heart: regular rate and rhythm, S1, S2 normal, no murmur, click, rub or gallop Extremities: extremities normal, atraumatic, no cyanosis or  edema Pulses: 2+ and symmetric Skin: Skin color, texture, turgor normal. No rashes or lesions Neurologic: Alert and oriented X 3, normal strength and tone. Normal symmetric reflexes. Normal coordination and gait  EKG sinus bradycardia 53 with septal Q waves.  I personally reviewed this EKG.  ASSESSMENT AND PLAN:   Coronary artery disease History of CAD status post coronary artery bypass grafting 1992 by Dr. Servando Snare.  His last Myoview performed July 07, 2012 was nonischemic.  He denies chest pain or shortness of breath.  Essential hypertension History of essential hypertension with blood pressure measured today at 139/72.  He is on amlodipine, chlorthalidone, and olmesartan.  Hyperlipidemia History of hyperlipidemia on statin therapy followed by his PCP.  Peripheral arterial disease History of PAD status post aortobifemoral bypass grafting secondary to aortic aneurysm performed by Dr. Victorino Dike in 2005.  Recent ultrasound performed 11/27/2018 revealed normal aortic dimensions with normal distal anastomotic velocities.  The patient does have a  known occluded SFAs bilaterally and has mild lifestyle limiting claudication.  Obstructive sleep apnea History of obstructive sleep apnea on CPAP  Mild aortic stenosis 2D echocardiogram performed 12/30/2017 revealed mild aortic stenosis with a peak gradient of 17 and a valve area of 1.93 cm.  We will repeat this in 12 months.      Lorretta Harp MD FACP,FACC,FAHA, [facility] 05/19/2019 3:26 PM

## 2019-05-19 NOTE — Assessment & Plan Note (Signed)
2D echocardiogram performed 12/30/2017 revealed mild aortic stenosis with a peak gradient of 17 and a valve area of 1.93 cm.  We will repeat this in 12 months.

## 2019-05-19 NOTE — Patient Instructions (Addendum)
Medication Instructions:  Your physician recommends that you continue on your current medications as directed. Please refer to the Current Medication list given to you today.  If you need a refill on your cardiac medications before your next appointment, please call your pharmacy.   Lab work: NONE  Testing/Procedures: NONE  Follow-Up: At [facility], you and your health needs are our priority.  As part of our continuing mission to provide you with exceptional heart care, we have created designated Provider Care Teams.  These Care Teams include your primary Cardiologist (physician) and Advanced Practice Providers (APPs -  Physician Assistants and Nurse Practitioners) who all work together to provide you with the care you need, when you need it. You may see Quay Burow, MD or one of the following Advanced Practice Providers on your designated Care Team:    Kerin Ransom, PA-C  [place], [place]  Coletta Memos, [place]  Your physician wants you to follow-up in: 1 year with Dr. Gwenlyn Found

## 2019-05-19 NOTE — Assessment & Plan Note (Signed)
History of PAD status post aortobifemoral bypass grafting secondary to aortic aneurysm performed by Dr. Victorino Dike in 2005.  Recent ultrasound performed 11/27/2018 revealed normal aortic dimensions with normal distal anastomotic velocities.  The patient does have a known occluded SFAs bilaterally and has mild lifestyle limiting claudication.

## 2019-05-19 NOTE — Assessment & Plan Note (Signed)
History of CAD status post coronary artery bypass grafting 1992 by Dr. Servando Snare.  His last Myoview performed July 07, 2012 was nonischemic.  He denies chest pain or shortness of breath.

## 2019-05-19 NOTE — Assessment & Plan Note (Signed)
History of essential hypertension with blood pressure measured today at 139/72.  He is on amlodipine, chlorthalidone, and olmesartan.

## 2019-12-28 ENCOUNTER — Other Ambulatory Visit: Payer: Self-pay | Admitting: Dermatology

## 2020-01-08 ENCOUNTER — Other Ambulatory Visit: Payer: Self-pay | Admitting: Dermatology

## 2020-05-01 ENCOUNTER — Other Ambulatory Visit: Payer: Self-pay

## 2020-05-01 ENCOUNTER — Ambulatory Visit (HOSPITAL_COMMUNITY): Payer: Medicare Other | Attending: Cardiology

## 2020-05-01 DIAGNOSIS — I350 Nonrheumatic aortic (valve) stenosis: Secondary | ICD-10-CM

## 2020-05-01 LAB — ECHOCARDIOGRAM COMPLETE
AR max vel: 1.76 cm2
AV Area VTI: 1.82 cm2
AV Area mean vel: 1.72 cm2
AV Mean grad: 9 mmHg
AV Peak grad: 18 mmHg
Ao pk vel: 2.12 m/s
Area-P 1/2: 4.12 cm2
P 1/2 time: 578 msec
S' Lateral: 3.7 cm

## 2020-05-21 ENCOUNTER — Ambulatory Visit: Payer: Medicare Other | Admitting: Cardiovascular Disease

## 2020-05-21 ENCOUNTER — Telehealth: Payer: Self-pay | Admitting: Cardiovascular Disease

## 2020-05-21 ENCOUNTER — Encounter: Payer: Self-pay | Admitting: Cardiovascular Disease

## 2020-05-21 ENCOUNTER — Other Ambulatory Visit: Payer: Self-pay

## 2020-05-21 DIAGNOSIS — I739 Peripheral vascular disease, unspecified: Secondary | ICD-10-CM | POA: Diagnosis not present

## 2020-05-21 DIAGNOSIS — I2510 Atherosclerotic heart disease of native coronary artery without angina pectoris: Secondary | ICD-10-CM | POA: Diagnosis not present

## 2020-05-21 DIAGNOSIS — I350 Nonrheumatic aortic (valve) stenosis: Secondary | ICD-10-CM | POA: Diagnosis not present

## 2020-05-21 DIAGNOSIS — I10 Essential (primary) hypertension: Secondary | ICD-10-CM

## 2020-05-21 DIAGNOSIS — E782 Mixed hyperlipidemia: Secondary | ICD-10-CM

## 2020-05-21 MED ORDER — HYDROCHLOROTHIAZIDE 25 MG PO TABS: 25.0000 mg | ORAL_TABLET | Freq: Every day | ORAL | 3 refills | Status: DC

## 2020-05-21 MED ORDER — HYDROCHLOROTHIAZIDE 12.5 MG PO CAPS: 12.5000 mg | ORAL_CAPSULE | Freq: Every day | ORAL | 3 refills | Status: DC

## 2020-05-21 NOTE — Progress Notes (Signed)
05/21/2020 Johnny [name]   05-15-1945  [id number]  Primary Physician Lawerance Cruel, MD Primary Cardiologist: Lorretta Harp MD Garret Reddish, [place], [place]  HPI:  Johnny [name] is a 75 y.o. male mildly overweight, married Caucasian male, father of 42, grandfather to 3 grandchildren who I last saw 05/19/2019. He has a history of CAD status post coronary artery bypass grafting in 1992 by Dr. Ceasar Mons. He also had aortobifemoral bypass grafting secondary to abdominal aortic aneurysm done by Dr. Mamie Nick in 2005. His other problems include treated hypertension, dyslipidemia, and obstructive sleep apneaon CPAP which he wears and benefits from.He denies chest pain or shortness of breath. He had an uncomplicated right total knee replacement performed by Dr. Gaynelle Arabian July of last year. Myoview stress test performed March 2012 was nonischemic and carotid Dopplers were normal as well. He had a Myoview stress test performed in March of this year that showed inferolateral scar without ischemia with an EF in the 50% range. He is asymptomatic. His lipid profile followed by his primary care physician. Because of mild lifestyle limiting claudication lower extremity arterial Dopplers were performed in our office on 05/03/13 which revealed a patent aortobifemoral bypass graft, severe SFA disease bilaterally with ABIs in the 0.87 range. Since I saw him back one year ago he has remained chronically stable. He still has mild claudication. He has changed his diet dramatically after reading several books recommended by his primary care doctor including Eat To Live by Dr. Baker Janus.  He has noticed some slight increasing dyspnea when walking up an incline especially when playing 9 holes of golf or walking up multiple flights of stairs carrying heavy objects. He denies chest pain. He has mild lifestyle limiting claudication. His weight has remained stable. He is not working out regularly like he did  several years ago..  Since I saw him a year and a half ago he is remained stable.  He continues to walk and has mild limitation with claudication.  Otherwise he denies chest pain or shortness of breath.   Current Meds  Medication Sig  . amLODipine (NORVASC) 10 MG tablet Take 1 tablet (10 mg total) by mouth daily.  Marland Kitchen aspirin 325 MG tablet Take 85 mg by mouth daily.   . chlorthalidone (HYGROTON) 25 MG tablet TAKE 1/2 TABLET BY MOUTH  DAILY  . clobetasol (TEMOVATE) 0.05 % external solution APPLY TO AFFECTED AREA AS DIRECTED  . famotidine (PEPCID) 40 MG tablet Take 40 mg by mouth daily.  . hydrochlorothiazide (HYDRODIURIL) 25 MG tablet Take 1 tablet (25 mg total) by mouth daily.  Marland Kitchen latanoprost (XALATAN) 0.005 % ophthalmic solution 1 drop at bedtime.  . metFORMIN (GLUCOPHAGE) 500 MG tablet Take 500 mg by mouth daily.  Marland Kitchen olmesartan (BENICAR) 40 MG tablet Take 1 tablet (40 mg total) by mouth daily.  . simvastatin (ZOCOR) 20 MG tablet Take 1 tablet (20 mg total) by mouth daily.     Allergies  Allergen Reactions  . Statins Other (See Comments)    Intolerance     Social History   Socioeconomic History  . Marital status: Married    Spouse name: Not on file  . Number of children: Not on file  . Years of education: Not on file  . Highest education level: Not on file  Occupational History  . Not on file  Tobacco Use  . Smoking status: Former Smoker    Quit date: 08/26/1990    Years since  quitting: 29.7  . Smokeless tobacco: Never Used  Substance and Sexual Activity  . Alcohol use: Yes    Comment: occassional   . Drug use: No  . Sexual activity: Not Currently  Other Topics Concern  . Not on file  Social History Narrative  . Not on file   Social Determinants of Health   Financial Resource Strain: Not on file  Food Insecurity: Not on file  Transportation Needs: Not on file  Physical Activity: Not on file  Stress: Not on file  Social Connections: Not on file  Intimate Partner  Violence: Not on file     Review of Systems: General: negative for chills, fever, night sweats or weight changes.  Cardiovascular: negative for chest pain, dyspnea on exertion, edema, orthopnea, palpitations, paroxysmal nocturnal dyspnea or shortness of breath Dermatological: negative for rash Respiratory: negative for cough or wheezing Urologic: negative for hematuria Abdominal: negative for nausea, vomiting, diarrhea, bright red blood per rectum, melena, or hematemesis Neurologic: negative for visual changes, syncope, or dizziness All other systems reviewed and are otherwise negative except as noted above.    Blood pressure 130/70, pulse 61, height 5\' 8"  (1.727 m), weight 231 lb (104.8 kg).  General appearance: alert and no distress Neck: no adenopathy, no carotid bruit, no JVD, supple, symmetrical, trachea midline and thyroid not enlarged, symmetric, no tenderness/mass/nodules Lungs: clear to auscultation bilaterally Heart: Soft outflow tract murmur Extremities: extremities normal, atraumatic, no cyanosis or edema Pulses: Diminished pedal pulses bilaterally Skin: Skin color, texture, turgor normal. No rashes or lesions Neurologic: Alert and oriented X 3, normal strength and tone. Normal symmetric reflexes. Normal coordination and gait  EKG sinus rhythm at 61 with Q waves in the septal leads and nonspecific ST and T wave changes.  I personally reviewed this EKG.  ASSESSMENT AND PLAN:   Coronary artery disease History of CAD status post coronary artery bypass grafting by Dr. Servando Snare in 1992.  His last Myoview performed 07/07/2012 was nonischemic.  He denies chest pain but does get mildly dyspneic with exertion.  Essential hypertension History of essential potential blood pressure measured today 130/70.  He is on chlorthalidone, amlodipine and olmesartan.  Hyperlipidemia History of hyperlipidemia on simvastatin and Zetia with lipid profile performed 07/31/2019 revealing total  cholesterol of 155, LDL 76 and HDL 65.  Obstructive sleep apnea History of obstructive sleep apnea on CPAP  Peripheral arterial disease History of PAD status post aortobifemoral bypass grafting for abdominal aneurysm by Dr. Mamie Nick in 2005.  Recent lower extremity Dopplers revealed a patent aortobifemoral bypass graft with occluded SFAs bilaterally.  He does have some lifestyle of any claudication which is fairly mild and stable.  Mild aortic stenosis History of mild aortic stenosis by 2D echo recently performed 05/01/2020 with a valve area of 1.82 cm and a mean gradient of 9 mmHg.  This will be repeated on annual basis.      Lorretta Harp MD FACP,FACC,FAHA, [facility] 05/21/2020 10:15 AM

## 2020-05-21 NOTE — Telephone Encounter (Signed)
New message:     Patient calling stating that he is not sure which mg to be taking of hydrochlorothiazide. Please call patient.

## 2020-05-21 NOTE — Assessment & Plan Note (Signed)
History of essential potential blood pressure measured today 130/70.  He is on chlorthalidone, amlodipine and olmesartan.

## 2020-05-21 NOTE — Telephone Encounter (Signed)
Change to HCTZ 12.5 mg/d

## 2020-05-21 NOTE — Assessment & Plan Note (Signed)
History of mild aortic stenosis by 2D echo recently performed 05/01/2020 with a valve area of 1.82 cm and a mean gradient of 9 mmHg.  This will be repeated on annual basis.

## 2020-05-21 NOTE — Telephone Encounter (Signed)
Patient aware of dose change. He prefers hctz 12.5mg  capsule. Sent in Rx with note to pharmacy to d/c 25mg  tabs

## 2020-05-21 NOTE — Assessment & Plan Note (Signed)
History of hyperlipidemia on simvastatin and Zetia with lipid profile performed 07/31/2019 revealing total cholesterol of 155, LDL 76 and HDL 65.

## 2020-05-21 NOTE — Assessment & Plan Note (Signed)
History of obstructive sleep apnea on CPAP. 

## 2020-05-21 NOTE — Patient Instructions (Signed)
Medication Instructions:  Start taking Hydrochlorothiazide 25mg  daily.  Stop taking Chlorthalidone.   *If you need a refill on your cardiac medications before your next appointment, please call your pharmacy*   Lab Work: Your physician recommends that you return for lab work in: 2 weeks- BMET  If you have labs (blood work) drawn today and your tests are completely normal, you will receive your results only by: Marland Kitchen MyChart Message (if you have MyChart) OR . A paper copy in the mail If you have any lab test that is abnormal or we need to change your treatment, we will call you to review the results.   Testing/Procedures: Your physician has requested that you have an echocardiogram. Echocardiography is a painless test that uses sound waves to create images of your heart. It provides your doctor with information about the size and shape of your heart and how well your heart's chambers and valves are working. This procedure takes approximately one hour. There are no restrictions for this procedure. This procedure is done at: [address]rd [address].  To be done in Jan 2023.   Follow-Up: At [facility], you and your health needs are our priority.  As part of our continuing mission to provide you with exceptional heart care, we have created designated Provider Care Teams.  These Care Teams include your primary Cardiologist (physician) and Advanced Practice Providers (APPs -  Physician Assistants and Nurse Practitioners) who all work together to provide you with the care you need, when you need it.  We recommend signing up for the patient portal called "MyChart".  Sign up information is provided on this After Visit Summary.  MyChart is used to connect with patients for Virtual Visits (Telemedicine).  Patients are able to view lab/test results, encounter notes, upcoming appointments, etc.  Non-urgent messages can be sent to your provider as well.   To learn more about what you can do with  MyChart, go to [email or web address].    Your next appointment:   12 month(s)  The format for your next appointment:   In Person  Provider:   Quay Burow, MD

## 2020-05-21 NOTE — Telephone Encounter (Signed)
Spoke with patient of Dr. Gwenlyn Found, seen today   He reports he was previously taking hctz 12.5mg  which worked better than chlorthalidone 25mg . He was advised today to STOP chlorthalidone and start hctz 25mg . He wanted to ensure MD wants him on 25mg  and not 12.5mg  he had taken in the past. Advised will send to MD

## 2020-05-21 NOTE — Assessment & Plan Note (Signed)
History of CAD status post coronary artery bypass grafting by Dr. Servando Snare in 1992.  His last Myoview performed 07/07/2012 was nonischemic.  He denies chest pain but does get mildly dyspneic with exertion.

## 2020-05-21 NOTE — Assessment & Plan Note (Signed)
History of PAD status post aortobifemoral bypass grafting for abdominal aneurysm by Dr. Mamie Nick in 2005.  Recent lower extremity Dopplers revealed a patent aortobifemoral bypass graft with occluded SFAs bilaterally.  He does have some lifestyle of any claudication which is fairly mild and stable.

## 2020-06-04 DIAGNOSIS — I10 Essential (primary) hypertension: Secondary | ICD-10-CM | POA: Diagnosis not present

## 2020-06-04 DIAGNOSIS — H401131 Primary open-angle glaucoma, bilateral, mild stage: Secondary | ICD-10-CM | POA: Diagnosis not present

## 2020-06-04 DIAGNOSIS — H2513 Age-related nuclear cataract, bilateral: Secondary | ICD-10-CM | POA: Diagnosis not present

## 2020-06-04 DIAGNOSIS — H43813 Vitreous degeneration, bilateral: Secondary | ICD-10-CM | POA: Diagnosis not present

## 2020-06-04 DIAGNOSIS — E782 Mixed hyperlipidemia: Secondary | ICD-10-CM | POA: Diagnosis not present

## 2020-06-04 DIAGNOSIS — H524 Presbyopia: Secondary | ICD-10-CM | POA: Diagnosis not present

## 2020-06-05 LAB — BASIC METABOLIC PANEL
BUN/Creatinine Ratio: 12 (ref 10–24)
BUN: 18 mg/dL (ref 8–27)
CO2: 23 mmol/L (ref 20–29)
Calcium: 9.5 mg/dL (ref 8.6–10.2)
Chloride: 94 mmol/L — ABNORMAL LOW (ref 96–106)
Creatinine, Ser: 1.45 mg/dL — ABNORMAL HIGH (ref 0.76–1.27)
GFR calc Af Amer: 54 mL/min/{1.73_m2} — ABNORMAL LOW (ref >59)
GFR calc non Af Amer: 46 mL/min/{1.73_m2} — ABNORMAL LOW (ref >59)
Glucose: 107 mg/dL — ABNORMAL HIGH (ref 65–99)
Potassium: 4.1 mmol/L (ref 3.5–5.2)
Sodium: 132 mmol/L — ABNORMAL LOW (ref 134–144)

## 2020-06-12 DIAGNOSIS — G4733 Obstructive sleep apnea (adult) (pediatric): Secondary | ICD-10-CM | POA: Diagnosis not present

## 2020-06-24 DIAGNOSIS — I10 Essential (primary) hypertension: Secondary | ICD-10-CM | POA: Diagnosis not present

## 2020-06-24 DIAGNOSIS — K219 Gastro-esophageal reflux disease without esophagitis: Secondary | ICD-10-CM | POA: Diagnosis not present

## 2020-06-24 DIAGNOSIS — E7800 Pure hypercholesterolemia, unspecified: Secondary | ICD-10-CM | POA: Diagnosis not present

## 2020-06-24 DIAGNOSIS — I2510 Atherosclerotic heart disease of native coronary artery without angina pectoris: Secondary | ICD-10-CM | POA: Diagnosis not present

## 2020-07-11 DIAGNOSIS — H6123 Impacted cerumen, bilateral: Secondary | ICD-10-CM | POA: Diagnosis not present

## 2020-07-30 DIAGNOSIS — I10 Essential (primary) hypertension: Secondary | ICD-10-CM | POA: Diagnosis not present

## 2020-07-30 DIAGNOSIS — K219 Gastro-esophageal reflux disease without esophagitis: Secondary | ICD-10-CM | POA: Diagnosis not present

## 2020-07-30 DIAGNOSIS — I2510 Atherosclerotic heart disease of native coronary artery without angina pectoris: Secondary | ICD-10-CM | POA: Diagnosis not present

## 2020-07-30 DIAGNOSIS — E7800 Pure hypercholesterolemia, unspecified: Secondary | ICD-10-CM | POA: Diagnosis not present

## 2020-08-12 DIAGNOSIS — D649 Anemia, unspecified: Secondary | ICD-10-CM | POA: Diagnosis not present

## 2020-08-12 DIAGNOSIS — I10 Essential (primary) hypertension: Secondary | ICD-10-CM | POA: Diagnosis not present

## 2020-08-12 DIAGNOSIS — Z0000 Encounter for general adult medical examination without abnormal findings: Secondary | ICD-10-CM | POA: Diagnosis not present

## 2020-08-12 DIAGNOSIS — E7800 Pure hypercholesterolemia, unspecified: Secondary | ICD-10-CM | POA: Diagnosis not present

## 2020-08-12 DIAGNOSIS — I739 Peripheral vascular disease, unspecified: Secondary | ICD-10-CM | POA: Diagnosis not present

## 2020-08-12 DIAGNOSIS — R5383 Other fatigue: Secondary | ICD-10-CM | POA: Diagnosis not present

## 2020-08-12 DIAGNOSIS — K219 Gastro-esophageal reflux disease without esophagitis: Secondary | ICD-10-CM | POA: Diagnosis not present

## 2020-08-12 DIAGNOSIS — R7303 Prediabetes: Secondary | ICD-10-CM | POA: Diagnosis not present

## 2020-09-25 DIAGNOSIS — I2510 Atherosclerotic heart disease of native coronary artery without angina pectoris: Secondary | ICD-10-CM | POA: Diagnosis not present

## 2020-09-25 DIAGNOSIS — E7800 Pure hypercholesterolemia, unspecified: Secondary | ICD-10-CM | POA: Diagnosis not present

## 2020-09-25 DIAGNOSIS — I10 Essential (primary) hypertension: Secondary | ICD-10-CM | POA: Diagnosis not present

## 2020-09-25 DIAGNOSIS — K219 Gastro-esophageal reflux disease without esophagitis: Secondary | ICD-10-CM | POA: Diagnosis not present

## 2020-10-03 DIAGNOSIS — G4733 Obstructive sleep apnea (adult) (pediatric): Secondary | ICD-10-CM | POA: Diagnosis not present

## 2020-10-23 DIAGNOSIS — G4733 Obstructive sleep apnea (adult) (pediatric): Secondary | ICD-10-CM | POA: Diagnosis not present

## 2020-10-30 ENCOUNTER — Other Ambulatory Visit: Payer: Self-pay

## 2020-10-30 ENCOUNTER — Ambulatory Visit: Payer: Medicare Other | Admitting: Dermatology

## 2020-10-30 ENCOUNTER — Encounter: Payer: Self-pay | Admitting: Dermatology

## 2020-10-30 DIAGNOSIS — L820 Inflamed seborrheic keratosis: Secondary | ICD-10-CM

## 2020-10-30 DIAGNOSIS — Z1283 Encounter for screening for malignant neoplasm of skin: Secondary | ICD-10-CM

## 2020-10-30 DIAGNOSIS — D485 Neoplasm of uncertain behavior of skin: Secondary | ICD-10-CM

## 2020-10-30 NOTE — Patient Instructions (Signed)

## 2020-11-13 ENCOUNTER — Encounter: Payer: Self-pay | Admitting: Dermatology

## 2020-11-13 NOTE — Progress Notes (Signed)
   Follow-Up Visit   Subjective  Johnny [name] is a 75 y.o. male who presents for the following: Annual Exam (Left forearm- no itch no bleed- seems to be growing).  Enlarging crusts on left arm and left abdomen Location:  Duration:  Quality:  Associated Signs/Symptoms: Modifying Factors:  Severity:  Timing: Context:   Objective  Well appearing patient in no apparent distress; mood and affect are within normal limits. Left Inguinal Area 101mm focally eroded waxy papule     Left Forearm - Posterior 29mm pearly pink papule       All skin waist up examined.   Assessment & Plan    Neoplasm of uncertain behavior of skin (2) Left Inguinal Area  Skin / nail biopsy Type of biopsy: tangential   Informed consent: discussed and consent obtained   Timeout: patient name, date of birth, surgical site, and procedure verified   Anesthesia: the lesion was anesthetized in a standard fashion   Anesthetic:  1% lidocaine w/ epinephrine 1-100,000 local infiltration Instrument used: flexible razor blade   Hemostasis achieved with: ferric subsulfate   Outcome: patient tolerated procedure well   Post-procedure details: sterile dressing applied and wound care instructions given   Dressing type: bandage and petrolatum    Specimen 1 - Surgical pathology Differential Diagnosis: isk Check Margins: No  Left Forearm - Posterior  Skin / nail biopsy Type of biopsy: tangential   Informed consent: discussed and consent obtained   Timeout: patient name, date of birth, surgical site, and procedure verified   Anesthesia: the lesion was anesthetized in a standard fashion   Anesthetic:  1% lidocaine w/ epinephrine 1-100,000 local infiltration Instrument used: flexible razor blade   Hemostasis achieved with: ferric subsulfate   Outcome: patient tolerated procedure well   Post-procedure details: sterile dressing applied and wound care instructions given   Dressing type: bandage and petrolatum     Specimen 2 - Surgical pathology Differential Diagnosis: bcc scc  Check Margins: No      I, Lavonna Monarch, MD, have reviewed all documentation for this visit.  The documentation on 11/13/20 for the exam, diagnosis, procedures, and orders are all accurate and complete.

## 2020-12-02 DIAGNOSIS — G4733 Obstructive sleep apnea (adult) (pediatric): Secondary | ICD-10-CM | POA: Diagnosis not present

## 2020-12-03 DIAGNOSIS — H401134 Primary open-angle glaucoma, bilateral, indeterminate stage: Secondary | ICD-10-CM | POA: Diagnosis not present

## 2021-01-27 ENCOUNTER — Telehealth: Payer: Self-pay | Admitting: Cardiovascular Disease

## 2021-01-27 MED ORDER — HYDROCHLOROTHIAZIDE 12.5 MG PO CAPS: 12.5000 mg | ORAL_CAPSULE | Freq: Every day | ORAL | 0 refills | Status: DC

## 2021-01-27 NOTE — Telephone Encounter (Signed)
Pt c/o medication issue:  1. Name of Medication: hydrochlorothiazide (MICROZIDE) 12.5 MG capsule  2. How are you currently taking this medication (dosage and times per day)? Take 1 capsule (12.5 mg total) by mouth daily.  3. Are you having a reaction (difficulty breathing--STAT)? No   4. What is your medication issue? Patient is out of medication and needs a 30 day supply sent in to [id number] - [place], [place]

## 2021-01-27 NOTE — Telephone Encounter (Signed)
*  STAT* If patient is at the pharmacy, call can be transferred to refill team.   1. Which medications need to be refilled? (please list name of each medication and dose if known)  hydrochlorothiazide (MICROZIDE) 12.5 MG capsule  2. Which pharmacy/location (including street and city if local pharmacy) is medication to be sent to?  [facility]  ([place], [place]  3. Do they need a 30 day or 90 day supply? 90 day supply

## 2021-01-28 MED ORDER — HYDROCHLOROTHIAZIDE 12.5 MG PO CAPS: 12.5000 mg | ORAL_CAPSULE | Freq: Every day | ORAL | 2 refills | Status: DC

## 2021-01-28 NOTE — Telephone Encounter (Signed)
E-sent a 90 day supply with 2 refills  as request to mail order. Patient aware

## 2021-03-14 DIAGNOSIS — G4733 Obstructive sleep apnea (adult) (pediatric): Secondary | ICD-10-CM | POA: Diagnosis not present

## 2021-03-27 DIAGNOSIS — I10 Essential (primary) hypertension: Secondary | ICD-10-CM | POA: Diagnosis not present

## 2021-03-27 DIAGNOSIS — I2510 Atherosclerotic heart disease of native coronary artery without angina pectoris: Secondary | ICD-10-CM | POA: Diagnosis not present

## 2021-03-27 DIAGNOSIS — E7800 Pure hypercholesterolemia, unspecified: Secondary | ICD-10-CM | POA: Diagnosis not present

## 2021-03-27 DIAGNOSIS — K219 Gastro-esophageal reflux disease without esophagitis: Secondary | ICD-10-CM | POA: Diagnosis not present

## 2021-04-13 DIAGNOSIS — G4733 Obstructive sleep apnea (adult) (pediatric): Secondary | ICD-10-CM | POA: Diagnosis not present

## 2021-04-18 DIAGNOSIS — G4733 Obstructive sleep apnea (adult) (pediatric): Secondary | ICD-10-CM | POA: Diagnosis not present

## 2021-05-14 ENCOUNTER — Ambulatory Visit (HOSPITAL_COMMUNITY): Payer: Medicare Other | Attending: Cardiovascular Disease

## 2021-05-14 ENCOUNTER — Other Ambulatory Visit: Payer: Self-pay

## 2021-05-14 DIAGNOSIS — I2510 Atherosclerotic heart disease of native coronary artery without angina pectoris: Secondary | ICD-10-CM | POA: Insufficient documentation

## 2021-05-14 DIAGNOSIS — G4733 Obstructive sleep apnea (adult) (pediatric): Secondary | ICD-10-CM | POA: Diagnosis not present

## 2021-05-14 LAB — ECHOCARDIOGRAM COMPLETE
AR max vel: 2.09 cm2
AV Area VTI: 2 cm2
AV Area mean vel: 1.93 cm2
AV Mean grad: 9 mmHg
AV Peak grad: 19.7 mmHg
Ao pk vel: 2.22 m/s
Area-P 1/2: 3.13 cm2
P 1/2 time: 554 msec
S' Lateral: 3.2 cm

## 2021-05-15 DIAGNOSIS — G4733 Obstructive sleep apnea (adult) (pediatric): Secondary | ICD-10-CM | POA: Diagnosis not present

## 2021-05-16 ENCOUNTER — Telehealth: Payer: Self-pay | Admitting: Cardiovascular Disease

## 2021-05-16 NOTE — Telephone Encounter (Signed)
° °  Pt is calling, he said, he received a message that his echo result is available at [facility]. He looked and he said he cant understand it. He would like for the nurse to call him to explain it to him

## 2021-05-16 NOTE — Telephone Encounter (Signed)
Spoke with patient who wanted an explanation of the Mychart note regarding his echo. I explained that his hear pumps out blood normally, that his heart doesn't relax normally when filling with blood, that age could be a factor, and his aorta is mildly dilated at 43 mm. A repeat echo is planned in 1 year. Patient also had a concern about his diuretic microzide not helping the swelling in his hands and wants to know if he should be back on chlorthalidone. He stated he will discuss it with Dr. Gwenlyn Found at his next appointment in March.

## 2021-06-06 DIAGNOSIS — H52203 Unspecified astigmatism, bilateral: Secondary | ICD-10-CM | POA: Diagnosis not present

## 2021-06-06 DIAGNOSIS — H353112 Nonexudative age-related macular degeneration, right eye, intermediate dry stage: Secondary | ICD-10-CM | POA: Diagnosis not present

## 2021-06-06 DIAGNOSIS — H401134 Primary open-angle glaucoma, bilateral, indeterminate stage: Secondary | ICD-10-CM | POA: Diagnosis not present

## 2021-06-06 DIAGNOSIS — H2513 Age-related nuclear cataract, bilateral: Secondary | ICD-10-CM | POA: Diagnosis not present

## 2021-06-14 DIAGNOSIS — G4733 Obstructive sleep apnea (adult) (pediatric): Secondary | ICD-10-CM | POA: Diagnosis not present

## 2021-07-12 DIAGNOSIS — G4733 Obstructive sleep apnea (adult) (pediatric): Secondary | ICD-10-CM | POA: Diagnosis not present

## 2021-07-16 ENCOUNTER — Ambulatory Visit: Payer: Medicare Other | Admitting: Cardiovascular Disease

## 2021-07-17 ENCOUNTER — Encounter: Payer: Self-pay | Admitting: Cardiovascular Disease

## 2021-07-17 ENCOUNTER — Ambulatory Visit: Payer: Medicare Other | Admitting: Cardiovascular Disease

## 2021-07-17 ENCOUNTER — Other Ambulatory Visit: Payer: Self-pay

## 2021-07-17 VITALS — BP 122/60 | HR 57 | Ht 67.0 in | Wt 216.6 lb

## 2021-07-17 DIAGNOSIS — I2510 Atherosclerotic heart disease of native coronary artery without angina pectoris: Secondary | ICD-10-CM | POA: Diagnosis not present

## 2021-07-17 DIAGNOSIS — I10 Essential (primary) hypertension: Secondary | ICD-10-CM | POA: Diagnosis not present

## 2021-07-17 DIAGNOSIS — I739 Peripheral vascular disease, unspecified: Secondary | ICD-10-CM

## 2021-07-17 DIAGNOSIS — I350 Nonrheumatic aortic (valve) stenosis: Secondary | ICD-10-CM | POA: Diagnosis not present

## 2021-07-17 MED ORDER — CHLORTHALIDONE 25 MG PO TABS: 12.5000 mg | ORAL_TABLET | Freq: Every day | ORAL | 3 refills | Status: DC

## 2021-07-17 NOTE — Assessment & Plan Note (Signed)
History of hyperlipidemia on simvastatin 20 and Zetia with lipid profile performed 08/12/2020 revealing total cholesterol 187, LDL of 97 and HDL of 73.  His LDL in 2021 was 76.  His wife has Alzheimer's who he takes care of and he says he is not "good cook". ?

## 2021-07-17 NOTE — Assessment & Plan Note (Signed)
History of mild aortic stenosis with 2/6 outflow tract murmur and echo performed 05/14/2021 that showed normal LV systolic function, grade 2 diastolic dysfunction with a mean aortic gradient of 9 mmHg.  This will be repeated on an annual basis. ?

## 2021-07-17 NOTE — Assessment & Plan Note (Signed)
History of coronary artery disease status post bypass grafting by Dr. Dr. Servando Snare in 1992.  Nonischemic Myoview March 2012.  He denies chest pain or shortness of breath. ?

## 2021-07-17 NOTE — Assessment & Plan Note (Signed)
History of PAD status post aortobifemoral bypass grafting by Dr. Victorino Dike in 2005.  He does have severe SFA disease bilaterally with ABIs in the 0.87 range.  He has mild claudication when walking up to a half a mile. ?

## 2021-07-17 NOTE — Progress Notes (Signed)
? ? ? ?07/17/2021 ?[name]   ?[birth year]  ?[id number] ? ?Primary Physician Lawerance Cruel, MD ?Primary Cardiologist: Lorretta Harp MD Lupe Carney, [place] ? ?HPI:  Johnny [name] is a 76 y.o.  male mildly overweight, married Caucasian male, father of 75, grandfather to 3 grandchildren who I last saw  05/19/2019.  He still works at [employer] several days a week.  He has a history of CAD status post coronary artery bypass grafting in 1992 by Dr. Ceasar Mons. He also had aortobifemoral bypass grafting secondary to abdominal aortic aneurysm done by Dr. Mamie Nick in 2005. His other problems include treated hypertension, dyslipidemia, and obstructive sleep apnea on CPAP which he wears and benefits from. He denies chest pain or shortness of breath. He had an uncomplicated right total knee replacement performed by Dr. Gaynelle Arabian July of last year. Myoview stress test performed March 2012 was nonischemic and carotid Dopplers were normal as well. He had a Myoview stress test performed in March of this year that showed inferolateral scar without ischemia with an EF in the 50% range. He is asymptomatic. His lipid profile followed by his primary care physician. Because of mild lifestyle limiting claudication lower extremity arterial Dopplers were performed in our office on 05/03/13 which revealed a patent aortobifemoral bypass graft, severe SFA disease bilaterally with ABIs in the 0.87 range. Since I saw him back one year ago he has remained chronically stable. He still has mild claudication. He has changed his diet dramatically after reading several books recommended by his primary care doctor including Eat To Live  by Dr. Baker Janus.  ?  ?  He has noticed some slight increasing dyspnea when walking up an incline especially when playing 9 holes of golf or walking up multiple flights of stairs carrying heavy objects.  He denies chest pain.  He has mild lifestyle limiting claudication.  His  weight has remained stable.  He is not working out regularly like he did several years ago.. ?  ?Since I saw him a year and a half ago he is remained stable.  He continues to walk and has mild limitation with claudication.  Otherwise he denies chest pain or shortness of breath. ? ? ?Current Meds  ?Medication Sig  ? amLODipine (NORVASC) 10 MG tablet Take 1 tablet (10 mg total) by mouth daily.  ? amoxicillin (AMOXIL) 500 MG capsule SMARTSIG:4 Capsule(s) By Mouth  ? aspirin 325 MG tablet Take 83 mg by mouth daily.  ? chlorthalidone (HYGROTON) 25 MG tablet Take 0.5 tablets (12.5 mg total) by mouth daily.  ? clobetasol (TEMOVATE) 0.05 % external solution APPLY TO AFFECTED AREA AS DIRECTED  ? Cyanocobalamin (VITAMIN B12) 1000 MCG TBCR 1 tablet  ? ezetimibe (ZETIA) 10 MG tablet Take 1 tablet (10 mg total) by mouth daily.  ? famotidine (PEPCID) 40 MG tablet Take 40 mg by mouth daily.  ? latanoprost (XALATAN) 0.005 % ophthalmic solution 1 drop at bedtime.  ? latanoprost (XALATAN) 0.005 % ophthalmic solution 1 drop into affected eye in the evening  ? metFORMIN (GLUCOPHAGE) 500 MG tablet Take 500 mg by mouth daily.  ? olmesartan (BENICAR) 40 MG tablet Take 1 tablet (40 mg total) by mouth daily.  ? simvastatin (ZOCOR) 20 MG tablet Take 1 tablet (20 mg total) by mouth daily.  ? [DISCONTINUED] hydrochlorothiazide (MICROZIDE) 12.5 MG capsule Take 1 capsule (12.5 mg total) by mouth daily.  ?  ? ?Allergies  ?Allergen Reactions  ?  Statins Other (See Comments)  ?  Intolerance ?  ? ? ?Social History  ? ?Socioeconomic History  ? Marital status: Married  ?  Spouse name: Not on file  ? Number of children: Not on file  ? Years of education: Not on file  ? Highest education level: Not on file  ?Occupational History  ? Not on file  ?Tobacco Use  ? Smoking status: Former  ?  Types: Cigarettes  ?  Quit date: 08/26/1990  ?  Years since quitting: 30.9  ? Smokeless tobacco: Never  ?Substance and Sexual Activity  ? Alcohol use: Yes  ?  Comment:  occassional   ? Drug use: No  ? Sexual activity: Not Currently  ?Other Topics Concern  ? Not on file  ?Social History Narrative  ? Not on file  ? ?Social Determinants of Health  ? ?Financial Resource Strain: Not on file  ?Food Insecurity: Not on file  ?Transportation Needs: Not on file  ?Physical Activity: Not on file  ?Stress: Not on file  ?Social Connections: Not on file  ?Intimate Partner Violence: Not on file  ?  ? ?Review of Systems: ?General: negative for chills, fever, night sweats or weight changes.  ?Cardiovascular: negative for chest pain, dyspnea on exertion, edema, orthopnea, palpitations, paroxysmal nocturnal dyspnea or shortness of breath ?Dermatological: negative for rash ?Respiratory: negative for cough or wheezing ?Urologic: negative for hematuria ?Abdominal: negative for nausea, vomiting, diarrhea, bright red blood per rectum, melena, or hematemesis ?Neurologic: negative for visual changes, syncope, or dizziness ?All other systems reviewed and are otherwise negative except as noted above. ? ? ? ?Blood pressure 122/60, pulse (!) 57, height '5\' 7"'$  (1.702 m), weight 216 lb 9.6 oz (98.2 kg), SpO2 97 %.  ?General appearance: alert and no distress ?Neck: no adenopathy, no carotid bruit, no JVD, supple, symmetrical, trachea midline, and thyroid not enlarged, symmetric, no tenderness/mass/nodules ?Lungs: clear to auscultation bilaterally ?Heart: 2/6 outflow tract murmur consistent with aortic stenosis. ?Extremities: extremities normal, atraumatic, no cyanosis or edema ?Pulses: Absent pedal pulses ?Skin: Skin color, texture, turgor normal. No rashes or lesions ?Neurologic: Grossly normal ? ?EKG sinus bradycardia at 57 with first-degree AV block.  I personally reviewed this EKG. ? ?ASSESSMENT AND PLAN:  ? ?Coronary artery disease ?History of coronary artery disease status post bypass grafting by Dr. Dr. Servando Snare in 1992.  Nonischemic Myoview March 2012.  He denies chest pain or shortness of  breath. ? ?Essential hypertension ?History of essential hypertension blood pressure measured at 122/60.  He is on amlodipine, olmesartan and hydrochlorothiazide. ? ?Hyperlipidemia ?History of hyperlipidemia on simvastatin 20 and Zetia with lipid profile performed 08/12/2020 revealing total cholesterol 187, LDL of 97 and HDL of 73.  His LDL in 2021 was 76.  His wife has Alzheimer's who he takes care of and he says he is not "good cook". ? ?Peripheral arterial disease ([place]) ?History of PAD status post aortobifemoral bypass grafting by Dr. Victorino Dike in 2005.  He does have severe SFA disease bilaterally with ABIs in the 0.87 range.  He has mild claudication when walking up to a half a mile. ? ?Mild aortic stenosis ?History of mild aortic stenosis with 2/6 outflow tract murmur and echo performed 05/14/2021 that showed normal LV systolic function, grade 2 diastolic dysfunction with a mean aortic gradient of 9 mmHg.  This will be repeated on an annual basis. ? ? ? ? ?Lorretta Harp MD FACP,FACC,FAHA, FSCAI ?07/17/2021 ?11:44 AM ?

## 2021-07-17 NOTE — Assessment & Plan Note (Signed)
History of essential hypertension blood pressure measured at 122/60.  He is on amlodipine, olmesartan and hydrochlorothiazide. ?

## 2021-07-17 NOTE — Patient Instructions (Signed)
Medication Instructions:  ? ?-Stop hydrochlorothiazide (microzide). ? ?-Start chlorthalidone (hygroton) 12.'5mg'$  once daily. ? ?*If you need a refill on your cardiac medications before your next appointment, please call your pharmacy* ? ? ?Lab Work: ?Your physician recommends that you return for lab work in: 2 weeks for BMET  ? ?If you have labs (blood work) drawn today and your tests are completely normal, you will receive your results only by: ?MyChart Message (if you have MyChart) OR ?A paper copy in the mail ?If you have any lab test that is abnormal or we need to change your treatment, we will call you to review the results. ? ? ?Testing/Procedures: ?Dr. Gwenlyn Found has recommended that you have an Ultrasound of your AORTA/IVC/ILIACS.  ? ?To prepare for this test: ? ?No food after 11PM the night before. Water is OK. (Don't drink liquids if you have been instructed not to for ANOTHER test).  ?Avoid foods that produce bowel gas, for 24 hours prior to exam (see below). ?No breakfast, no chewing gum, no smoking or carbonated beverages. ?Patient may take morning medications with water. ?Come in for test at least 15 minutes early to register. ? ?Your physician has requested that you have an ankle brachial index (ABI). During this test an ultrasound and blood pressure cuff are used to evaluate the arteries that supply the arms and legs with blood. Allow thirty minutes for this exam. There are no restrictions or special instructions. These procedures are done at [address] ? ?Your physician has requested that you have an echocardiogram. Echocardiography is a painless test that uses sound waves to create images of your heart. It provides your doctor with information about the size and shape of your heart and how well your heart?s chambers and valves are working. This procedure takes approximately one hour. There are no restrictions for this procedure. To be done in January 2024. This procedure is done at [address] ? ? ?Follow-Up: ?At [facility], you and your health needs are our priority.  As part of our continuing mission to provide you with exceptional heart care, we have created designated Provider Care Teams.  These Care Teams include your primary Cardiologist (physician) and Advanced Practice Providers (APPs -  Physician Assistants and Nurse Practitioners) who all work together to provide you with the care you need, when you need it. ? ?We recommend signing up for the patient portal called "MyChart".  Sign up information is provided on this After Visit Summary.  MyChart is used to connect with patients for Virtual Visits (Telemedicine).  Patients are able to view lab/test results, encounter notes, upcoming appointments, etc.  Non-urgent messages can be sent to your provider as well.   ?To learn more about what you can do with MyChart, go to [email or web address].   ? ?Your next appointment:   ?12 month(s) ? ?The format for your next appointment:   ?In Person ? ?Provider:   ?Quay Burow, MD  ?

## 2021-07-23 DIAGNOSIS — G4733 Obstructive sleep apnea (adult) (pediatric): Secondary | ICD-10-CM | POA: Diagnosis not present

## 2021-07-24 DIAGNOSIS — G4733 Obstructive sleep apnea (adult) (pediatric): Secondary | ICD-10-CM | POA: Diagnosis not present

## 2021-08-12 ENCOUNTER — Ambulatory Visit (HOSPITAL_BASED_OUTPATIENT_CLINIC_OR_DEPARTMENT_OTHER)
Admission: RE | Admit: 2021-08-12 | Discharge: 2021-08-12 | Disposition: A | Discharge status: Home / Self Care | Payer: Medicare Other | Source: Ambulatory Visit | Attending: Cardiovascular Disease | Admitting: Cardiovascular Disease

## 2021-08-12 ENCOUNTER — Ambulatory Visit (HOSPITAL_COMMUNITY)
Admission: RE | Admit: 2021-08-12 | Discharge: 2021-08-12 | Disposition: A | Discharge status: Home / Self Care | Payer: Medicare Other | Source: Ambulatory Visit | Attending: Cardiology | Admitting: Cardiology

## 2021-08-12 DIAGNOSIS — I739 Peripheral vascular disease, unspecified: Secondary | ICD-10-CM | POA: Insufficient documentation

## 2021-08-12 DIAGNOSIS — Z95820 Peripheral vascular angioplasty status with implants and grafts: Secondary | ICD-10-CM | POA: Diagnosis not present

## 2021-08-12 DIAGNOSIS — G4733 Obstructive sleep apnea (adult) (pediatric): Secondary | ICD-10-CM | POA: Diagnosis not present

## 2021-08-14 DIAGNOSIS — Z0000 Encounter for general adult medical examination without abnormal findings: Secondary | ICD-10-CM | POA: Diagnosis not present

## 2021-08-14 DIAGNOSIS — E7800 Pure hypercholesterolemia, unspecified: Secondary | ICD-10-CM | POA: Diagnosis not present

## 2021-08-14 DIAGNOSIS — R7303 Prediabetes: Secondary | ICD-10-CM | POA: Diagnosis not present

## 2021-08-14 DIAGNOSIS — I10 Essential (primary) hypertension: Secondary | ICD-10-CM | POA: Diagnosis not present

## 2021-08-19 DIAGNOSIS — I10 Essential (primary) hypertension: Secondary | ICD-10-CM | POA: Diagnosis not present

## 2021-08-19 DIAGNOSIS — R7303 Prediabetes: Secondary | ICD-10-CM | POA: Diagnosis not present

## 2021-08-19 DIAGNOSIS — E7800 Pure hypercholesterolemia, unspecified: Secondary | ICD-10-CM | POA: Diagnosis not present

## 2021-08-19 DIAGNOSIS — K219 Gastro-esophageal reflux disease without esophagitis: Secondary | ICD-10-CM | POA: Diagnosis not present

## 2021-08-19 DIAGNOSIS — Z0000 Encounter for general adult medical examination without abnormal findings: Secondary | ICD-10-CM | POA: Diagnosis not present

## 2021-09-11 DIAGNOSIS — G4733 Obstructive sleep apnea (adult) (pediatric): Secondary | ICD-10-CM | POA: Diagnosis not present

## 2021-09-19 ENCOUNTER — Other Ambulatory Visit: Payer: Self-pay | Admitting: Dermatology

## 2021-10-12 DIAGNOSIS — G4733 Obstructive sleep apnea (adult) (pediatric): Secondary | ICD-10-CM | POA: Diagnosis not present

## 2021-10-24 DIAGNOSIS — Z7289 Other problems related to lifestyle: Secondary | ICD-10-CM | POA: Diagnosis not present

## 2021-10-24 DIAGNOSIS — L299 Pruritus, unspecified: Secondary | ICD-10-CM | POA: Diagnosis not present

## 2021-11-03 ENCOUNTER — Ambulatory Visit: Payer: Medicare Other | Admitting: Dermatology

## 2021-11-03 ENCOUNTER — Encounter: Payer: Self-pay | Admitting: Dermatology

## 2021-11-03 DIAGNOSIS — L111 Transient acantholytic dermatosis [Grover]: Secondary | ICD-10-CM | POA: Diagnosis not present

## 2021-11-03 DIAGNOSIS — L2089 Other atopic dermatitis: Secondary | ICD-10-CM

## 2021-11-03 DIAGNOSIS — Z1283 Encounter for screening for malignant neoplasm of skin: Secondary | ICD-10-CM | POA: Diagnosis not present

## 2021-11-03 MED ORDER — CLOBETASOL PROPIONATE 0.05 % EX CREA: TOPICAL_CREAM | CUTANEOUS | Status: DC

## 2021-11-03 NOTE — Patient Instructions (Addendum)
Look up [place] online its a home injection you give.  Atopic Dermitis

## 2021-11-11 DIAGNOSIS — G4733 Obstructive sleep apnea (adult) (pediatric): Secondary | ICD-10-CM | POA: Diagnosis not present

## 2021-11-24 ENCOUNTER — Encounter: Payer: Self-pay | Admitting: Dermatology

## 2021-11-24 NOTE — Progress Notes (Signed)
   Follow-Up Visit   Subjective  Johnny [name] is a 76 y.o. male who presents for the following: Annual Exam (Yearly skin check he stated he has a full body itch tx doxipen and over the counter allergra ).  General skin examination, generalized itching Location:  Duration:  Quality:  Associated Signs/Symptoms: Modifying Factors:  Severity:  Timing: Context:   Objective  Well appearing patient in no apparent distress; mood and affect are within normal limits. No atypical nevi or signs of NMSC noted at the time of the visit. Large keratoses on the torso.  Torso - Posterior (Back) On the back some rash and some nothing present on the skin.  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  Chest (Upper Torso, Anterior) Over 50% of the body itching back, arms, chest    A full examination was performed including scalp, head, eyes, ears, nose, lips, neck, chest, axillae, abdomen, back, buttocks, bilateral upper extremities, bilateral lower extremities, hands, feet, fingers, toes, fingernails, and toenails. All findings within normal limits unless otherwise noted below.   Assessment & Plan    Screening exam for skin cancer  Annual skin examination.  Grover's disease Torso - Posterior (Back)  Try using clobetasol daily after bathing for 4 weeks; if there is no improvement he may be candidate to try Dupixent.  Other atopic dermatitis Chest (Upper Torso, Anterior)  Plan B Dupixent paper work [facility] so he will be another possible sample patient.  clobetasol cream (TEMOVATE) 0.05 % - Chest (Upper Torso, Anterior) Apply to skin 1-2 times daily not for face or skin folds 30 days per tube      I, Lavonna Monarch, MD, have reviewed all documentation for this visit.  The documentation on 11/24/21 for the exam, diagnosis, procedures, and orders are all accurate and complete.

## 2021-12-12 DIAGNOSIS — G4733 Obstructive sleep apnea (adult) (pediatric): Secondary | ICD-10-CM | POA: Diagnosis not present

## 2021-12-16 DIAGNOSIS — H401134 Primary open-angle glaucoma, bilateral, indeterminate stage: Secondary | ICD-10-CM | POA: Diagnosis not present

## 2022-02-13 DIAGNOSIS — L299 Pruritus, unspecified: Secondary | ICD-10-CM | POA: Diagnosis not present

## 2022-02-13 DIAGNOSIS — R58 Hemorrhage, not elsewhere classified: Secondary | ICD-10-CM | POA: Diagnosis not present

## 2022-03-16 DIAGNOSIS — L299 Pruritus, unspecified: Secondary | ICD-10-CM | POA: Diagnosis not present

## 2022-03-16 DIAGNOSIS — R634 Abnormal weight loss: Secondary | ICD-10-CM | POA: Diagnosis not present

## 2022-03-25 DIAGNOSIS — R21 Rash and other nonspecific skin eruption: Secondary | ICD-10-CM | POA: Diagnosis not present

## 2022-03-25 DIAGNOSIS — R030 Elevated blood-pressure reading, without diagnosis of hypertension: Secondary | ICD-10-CM | POA: Diagnosis not present

## 2022-04-15 DIAGNOSIS — L298 Other pruritus: Secondary | ICD-10-CM | POA: Diagnosis not present

## 2022-04-15 DIAGNOSIS — L0889 Other specified local infections of the skin and subcutaneous tissue: Secondary | ICD-10-CM | POA: Diagnosis not present

## 2022-04-15 DIAGNOSIS — L309 Dermatitis, unspecified: Secondary | ICD-10-CM | POA: Diagnosis not present

## 2022-04-15 DIAGNOSIS — D692 Other nonthrombocytopenic purpura: Secondary | ICD-10-CM | POA: Diagnosis not present

## 2022-04-15 DIAGNOSIS — L821 Other seborrheic keratosis: Secondary | ICD-10-CM | POA: Diagnosis not present

## 2022-04-15 DIAGNOSIS — D225 Melanocytic nevi of trunk: Secondary | ICD-10-CM | POA: Diagnosis not present

## 2022-04-15 DIAGNOSIS — L814 Other melanin hyperpigmentation: Secondary | ICD-10-CM | POA: Diagnosis not present

## 2022-05-07 ENCOUNTER — Other Ambulatory Visit: Payer: Self-pay | Admitting: Cardiovascular Disease

## 2022-05-15 DIAGNOSIS — H2513 Age-related nuclear cataract, bilateral: Secondary | ICD-10-CM | POA: Diagnosis not present

## 2022-05-15 DIAGNOSIS — H353112 Nonexudative age-related macular degeneration, right eye, intermediate dry stage: Secondary | ICD-10-CM | POA: Diagnosis not present

## 2022-05-15 DIAGNOSIS — H401134 Primary open-angle glaucoma, bilateral, indeterminate stage: Secondary | ICD-10-CM | POA: Diagnosis not present

## 2022-05-15 DIAGNOSIS — H52203 Unspecified astigmatism, bilateral: Secondary | ICD-10-CM | POA: Diagnosis not present

## 2022-05-15 DIAGNOSIS — R7303 Prediabetes: Secondary | ICD-10-CM | POA: Diagnosis not present

## 2022-05-15 DIAGNOSIS — H25013 Cortical age-related cataract, bilateral: Secondary | ICD-10-CM | POA: Diagnosis not present

## 2022-05-15 DIAGNOSIS — H524 Presbyopia: Secondary | ICD-10-CM | POA: Diagnosis not present

## 2022-05-19 ENCOUNTER — Ambulatory Visit (HOSPITAL_COMMUNITY): Payer: Medicare Other | Attending: Cardiovascular Disease

## 2022-05-19 DIAGNOSIS — I10 Essential (primary) hypertension: Secondary | ICD-10-CM | POA: Insufficient documentation

## 2022-05-19 DIAGNOSIS — I350 Nonrheumatic aortic (valve) stenosis: Secondary | ICD-10-CM | POA: Diagnosis not present

## 2022-05-19 DIAGNOSIS — I2510 Atherosclerotic heart disease of native coronary artery without angina pectoris: Secondary | ICD-10-CM | POA: Insufficient documentation

## 2022-05-20 LAB — ECHOCARDIOGRAM COMPLETE
AR max vel: 2.13 cm2
AV Area VTI: 2 cm2
AV Area mean vel: 2.26 cm2
AV Mean grad: 10.8 mmHg
AV Peak grad: 21.1 mmHg
Ao pk vel: 2.3 m/s
Area-P 1/2: 3.56 cm2
Est EF: 55
S' Lateral: 3.7 cm

## 2022-05-22 ENCOUNTER — Emergency Department (HOSPITAL_COMMUNITY)
Admission: EM | Admit: 2022-05-22 | Discharge: 2022-05-22 | Disposition: A | Discharge status: Home / Self Care | Payer: Medicare Other | Attending: Emergency Medicine | Admitting: Emergency Medicine

## 2022-05-22 ENCOUNTER — Encounter (HOSPITAL_COMMUNITY): Payer: Self-pay

## 2022-05-22 DIAGNOSIS — R9431 Abnormal electrocardiogram [ECG] [EKG]: Secondary | ICD-10-CM | POA: Diagnosis not present

## 2022-05-22 DIAGNOSIS — Z7982 Long term (current) use of aspirin: Secondary | ICD-10-CM | POA: Insufficient documentation

## 2022-05-22 DIAGNOSIS — R55 Syncope and collapse: Secondary | ICD-10-CM | POA: Insufficient documentation

## 2022-05-22 DIAGNOSIS — Z743 Need for continuous supervision: Secondary | ICD-10-CM | POA: Diagnosis not present

## 2022-05-22 DIAGNOSIS — R404 Transient alteration of awareness: Secondary | ICD-10-CM | POA: Diagnosis not present

## 2022-05-22 DIAGNOSIS — K5900 Constipation, unspecified: Secondary | ICD-10-CM | POA: Diagnosis not present

## 2022-05-22 DIAGNOSIS — L299 Pruritus, unspecified: Secondary | ICD-10-CM | POA: Diagnosis not present

## 2022-05-22 NOTE — ED Triage Notes (Signed)
Pt presents with c/o constipation for 3 days. Pt had a near syncopal/vagal episode when attempting to have a bowel movement.

## 2022-05-22 NOTE — ED Provider Triage Note (Signed)
Emergency Medicine Provider Triage Evaluation Note  VAN SEYMORE , a 77 y.o. male  was evaluated in triage.  Pt complains of constipation x 3 days.  He notes that he was straining when he felt that he was having a near syncopal episode.  Notes that he was able to have a bowel movement.  Also feels that he needs to have a bowel movement now.  Denies abdominal pain, nausea, vomiting, fever, urinary symptoms.   Review of Systems  Positive:  Negative:   Physical Exam  BP (!) 133/59 (BP Location: Left Arm)   Pulse 76   Temp 98.3 F (36.8 C) (Oral)   Resp 18   SpO2 95%  Gen:   Awake, no distress   Resp:  Normal effort  MSK:   Moves extremities without difficulty  Other:  No abdominal TTP.   Medical Decision Making  Medically screening exam initiated at 11:46 AM.  Appropriate orders placed.  [name] was informed that the remainder of the evaluation will be completed by another provider, this initial triage assessment does not replace that evaluation, and the importance of remaining in the ED until their evaluation is complete.  Work-up initiated.    Sicilia Killough A, PA-C 05/22/22 1203

## 2022-05-22 NOTE — ED Provider Notes (Signed)
[place] Provider Note   CSN: [id number] Arrival date & time: 05/22/22  1138     History  Chief Complaint  Patient presents with   Constipation    Johnny [name] is a 77 y.o. male who presents emergency department with concerns for constipation x 3 days. He notes that he was straining when he felt that he was having a near syncopal episode.  Notes that he was able to have a bowel movement.  Also feels that he needs to have a bowel movement now.  Denies abdominal pain, nausea, vomiting, fever, urinary symptoms.    The history is provided by the patient. No language interpreter was used.       Home Medications Prior to Admission medications   Medication Sig Start Date End Date Taking? Authorizing Provider  amLODipine (NORVASC) 10 MG tablet Take 1 tablet (10 mg total) by mouth daily. 06/09/18   Leonie Man, MD  amoxicillin (AMOXIL) 500 MG capsule SMARTSIG:4 Capsule(s) By Mouth 02/18/21   [provider]  aspirin 325 MG tablet Take 83 mg by mouth daily.    [provider]  chlorthalidone (HYGROTON) 25 MG tablet Take 0.5 tablets (12.5 mg total) by mouth daily. 07/17/21   Lorretta Harp, MD  clobetasol (TEMOVATE) 0.05 % external solution APPLY TO AFFECTED AREA AS DIRECTED 09/23/21   Lavonna Monarch, MD  clobetasol cream (TEMOVATE) 0.05 % Apply to skin 1-2 times daily not for face or skin folds 30 days per tube 11/03/21   Lavonna Monarch, MD  Cyanocobalamin (VITAMIN B12) 1000 MCG TBCR 1 tablet    [provider]  doxepin (SINEQUAN) 25 MG capsule Take 25 mg by mouth at bedtime. 10/24/21   [provider]  ezetimibe (ZETIA) 10 MG tablet Take 1 tablet (10 mg total) by mouth daily. 01/30/19 07/17/21  Lorretta Harp, MD  famotidine (PEPCID) 40 MG tablet Take 40 mg by mouth daily.    [provider]  latanoprost (XALATAN) 0.005 % ophthalmic solution 1 drop at bedtime.    [provider]   latanoprost (XALATAN) 0.005 % ophthalmic solution 1 drop into affected eye in the evening    [provider]  metFORMIN (GLUCOPHAGE) 500 MG tablet Take 500 mg by mouth daily.    [provider]  olmesartan (BENICAR) 40 MG tablet Take 1 tablet (40 mg total) by mouth daily. 06/01/18   Leonie Man, MD  simvastatin (ZOCOR) 20 MG tablet Take 1 tablet (20 mg total) by mouth daily. 06/01/18   Leonie Man, MD      Allergies    Statins    Review of Systems   Review of Systems  Gastrointestinal:  Positive for constipation.  All other systems reviewed and are negative.   Physical Exam Updated Vital Signs BP (!) 133/59 (BP Location: Left Arm)   Pulse 76   Temp 98.3 F (36.8 C) (Oral)   Resp 18   SpO2 95%  Physical Exam Vitals and nursing note reviewed.  Constitutional:      General: He is not in acute distress.    Appearance: He is not diaphoretic.  HENT:     Head: Normocephalic and atraumatic.     Mouth/Throat:     Pharynx: No oropharyngeal exudate.  Eyes:     General: No scleral icterus.    Conjunctiva/sclera: Conjunctivae normal.  Cardiovascular:     Rate and Rhythm: Normal rate and regular rhythm.  Pulses: Normal pulses.     Heart sounds: Normal heart sounds.  Pulmonary:     Effort: Pulmonary effort is normal. No respiratory distress.     Breath sounds: Normal breath sounds. No wheezing.  Abdominal:     General: Bowel sounds are normal.     Palpations: Abdomen is soft. There is no mass.     Tenderness: There is no abdominal tenderness. There is no guarding or rebound.     Comments: No abdominal TTP.   Musculoskeletal:        General: Normal range of motion.     Cervical back: Normal range of motion and neck supple.  Skin:    General: Skin is warm and dry.  Neurological:     Mental Status: He is alert.  Psychiatric:        Behavior: Behavior normal.     ED Results / Procedures / Treatments   Labs (all labs ordered are listed, but only  abnormal results are displayed) Labs Reviewed  URINALYSIS, ROUTINE W REFLEX MICROSCOPIC    EKG EKG Interpretation  Date/Time:  Friday May 22 2022 12:24:46 EST Ventricular Rate:  84 PR Interval:  207 QRS Duration: 107 QT Interval:  403 QTC Calculation: 477 R Axis:   56 Text Interpretation: Sinus rhythm Nonspecific T abnormalities, anterior leads Borderline prolonged QT interval Interpretation limited secondary to artifact no stemi Confirmed by Wynona Dove ([id number]) on 05/22/2022 12:34:00 PM  Radiology No results found.  Procedures Procedures    Medications Ordered in ED Medications - No data to display  ED Course/ Medical Decision Making/ A&P Clinical Course as of 05/22/22 1511  Fri May 22, 2022  1242 Attending evaluated patient and agrees with discharge plan.  [SB]    Clinical Course User Index [SB] Kjerstin Abrigo A, PA-C                             Medical Decision Making Amount and/or Complexity of Data Reviewed Labs: ordered.   Pt presents with concerns for constipation onset 3 days. Vital signs, patient afebrile, not tachycardic or hypoxic. On exam, pt with no acute cardiovascular, respiratory, abdominal exam findings. Differential diagnosis includes constipation, bowel obstruction, GI etiology.    Disposition: Presenting suspicious for constipation.  EKG without acute concerning findings today.  Patient overall well-appearing, no abdominal tenderness to palpation.  Patient asymptomatic at this time.  Patient able to ambulate without assistance or difficulty in the emergency department.  Patient notes that he had a bowel movement prior to arrival to the ED as well as feels as if he can have another bowel movement as well.  Patient requesting to go home at this time to take care of his wife who has Alzheimer's.  Case discussed with attending who evaluated patient and agrees with discharge treatment plan. After consideration of the diagnostic results and the patients  response to treatment, I feel that the patient would benefit from Discharge home. Supportive care measures and strict return precautions discussed with patient at bedside. Pt acknowledges and verbalizes understanding. Pt appears safe for discharge. Follow up as indicated in discharge paperwork.    This chart was dictated using voice recognition software, Dragon. Despite the best efforts of this provider to proofread and correct errors, errors may still occur which can change documentation meaning.  Final Clinical Impression(s) / ED Diagnoses Final diagnoses:  Constipation, unspecified constipation type    Rx / DC Orders ED Discharge Orders  None         Iker Nuttall A, PA-C 05/22/22 1511    Jeanell Sparrow, DO 05/24/22 1139

## 2022-05-22 NOTE — Discharge Instructions (Addendum)
Please follow up with your primary care doctor within 2-3 days. For constipation we also recommend a diet high in fiber (beans, fruits, vegetables, whole grains). Take Colace 100-200 mg up to three times per day. You may take along with Senokot 1-2 tabs, ingest with full glass of water.  You may also take MiraLAX 1-2 capfuls 1-2 times a day until stools become soft and then slowly decrease the amount of MiraLAX used.  Maintain fluid intake 6-8 glasses per day. Please increase fibers in your diet. You may also take Milk of Magnesia 30 mL as needed for constipation, you may repeat in 2 hours again if no bowl movement.  

## 2022-05-26 DIAGNOSIS — L298 Other pruritus: Secondary | ICD-10-CM | POA: Diagnosis not present

## 2022-05-26 DIAGNOSIS — T7840XA Allergy, unspecified, initial encounter: Secondary | ICD-10-CM | POA: Diagnosis not present

## 2022-05-26 DIAGNOSIS — D485 Neoplasm of uncertain behavior of skin: Secondary | ICD-10-CM | POA: Diagnosis not present

## 2022-06-09 DIAGNOSIS — T148XXA Other injury of unspecified body region, initial encounter: Secondary | ICD-10-CM | POA: Diagnosis not present

## 2022-06-09 DIAGNOSIS — I739 Peripheral vascular disease, unspecified: Secondary | ICD-10-CM | POA: Diagnosis not present

## 2022-06-09 DIAGNOSIS — I10 Essential (primary) hypertension: Secondary | ICD-10-CM | POA: Diagnosis not present

## 2022-06-09 DIAGNOSIS — N1831 Chronic kidney disease, stage 3a: Secondary | ICD-10-CM | POA: Diagnosis not present

## 2022-06-09 DIAGNOSIS — L308 Other specified dermatitis: Secondary | ICD-10-CM | POA: Diagnosis not present

## 2022-07-07 DIAGNOSIS — L298 Other pruritus: Secondary | ICD-10-CM | POA: Diagnosis not present

## 2022-07-09 ENCOUNTER — Telehealth: Payer: Self-pay | Admitting: Physician Assistant

## 2022-07-09 NOTE — Telephone Encounter (Signed)
scheduled per 3.13 referral, pt has been called and confirmed date and time. Pt is aware of location and to arrive early for check in

## 2022-07-14 ENCOUNTER — Other Ambulatory Visit: Payer: Self-pay

## 2022-07-14 ENCOUNTER — Encounter: Payer: Self-pay | Admitting: Physician Assistant

## 2022-07-14 ENCOUNTER — Inpatient Hospital Stay: Payer: Medicare Other | Attending: Physician Assistant | Admitting: Physician Assistant

## 2022-07-14 ENCOUNTER — Inpatient Hospital Stay: Payer: Medicare Other

## 2022-07-14 VITALS — BP 151/64 | HR 77 | Temp 97.9°F | Resp 14 | Wt 209.8 lb

## 2022-07-14 DIAGNOSIS — L299 Pruritus, unspecified: Secondary | ICD-10-CM | POA: Insufficient documentation

## 2022-07-14 LAB — CBC WITH DIFFERENTIAL (CANCER CENTER ONLY)
Abs Immature Granulocytes: 0.02 10*3/uL (ref 0.00–0.07)
Basophils Absolute: 0 10*3/uL (ref 0.0–0.1)
Basophils Relative: 1 %
Eosinophils Absolute: 0.1 10*3/uL (ref 0.0–0.5)
Eosinophils Relative: 2 %
HCT: 35 % — ABNORMAL LOW (ref 39.0–52.0)
Hemoglobin: 12.2 g/dL — ABNORMAL LOW (ref 13.0–17.0)
Immature Granulocytes: 0 %
Lymphocytes Relative: 10 %
Lymphs Abs: 0.7 10*3/uL (ref 0.7–4.0)
MCH: 34.9 pg — ABNORMAL HIGH (ref 26.0–34.0)
MCHC: 34.9 g/dL (ref 30.0–36.0)
MCV: 100 fL (ref 80.0–100.0)
Monocytes Absolute: 0.6 10*3/uL (ref 0.1–1.0)
Monocytes Relative: 10 %
Neutro Abs: 5.1 10*3/uL (ref 1.7–7.7)
Neutrophils Relative %: 77 %
Platelet Count: 179 10*3/uL (ref 150–400)
RBC: 3.5 MIL/uL — ABNORMAL LOW (ref 4.22–5.81)
RDW: 12.2 % (ref 11.5–15.5)
WBC Count: 6.6 10*3/uL (ref 4.0–10.5)
nRBC: 0 % (ref 0.0–0.2)

## 2022-07-14 LAB — CMP (CANCER CENTER ONLY)
ALT: 21 U/L (ref 0–44)
AST: 26 U/L (ref 15–41)
Albumin: 4.4 g/dL (ref 3.5–5.0)
Alkaline Phosphatase: 85 U/L (ref 38–126)
Anion gap: 11 (ref 5–15)
BUN: 21 mg/dL (ref 8–23)
CO2: 28 mmol/L (ref 22–32)
Calcium: 10 mg/dL (ref 8.9–10.3)
Chloride: 96 mmol/L — ABNORMAL LOW (ref 98–111)
Creatinine: 1.39 mg/dL — ABNORMAL HIGH (ref 0.61–1.24)
GFR, Estimated: 52 mL/min — ABNORMAL LOW (ref >60)
Glucose, Bld: 117 mg/dL — ABNORMAL HIGH (ref 70–99)
Potassium: 4.1 mmol/L (ref 3.5–5.1)
Sodium: 135 mmol/L (ref 135–145)
Total Bilirubin: 0.9 mg/dL (ref 0.3–1.2)
Total Protein: 7.6 g/dL (ref 6.5–8.1)

## 2022-07-14 LAB — SEDIMENTATION RATE: Sed Rate: 37 mm/hr — ABNORMAL HIGH (ref 0–16)

## 2022-07-14 LAB — LACTATE DEHYDROGENASE: LDH: 226 U/L — ABNORMAL HIGH (ref 98–192)

## 2022-07-14 LAB — C-REACTIVE PROTEIN: CRP: <0.5 mg/dL (ref <1.0)

## 2022-07-14 NOTE — Progress Notes (Signed)
[place] [phone]   [phone]  INITIAL CONSULTATION:  Patient Care Team: Lawerance Cruel, MD as PCP - General (Family Medicine) Lorretta Harp, MD as PCP - Cardiology (Cardiology) Lavonna Monarch, MD (Inactive) as Consulting Physician (Dermatology)  CHIEF COMPLAINTS/PURPOSE OF CONSULTATION:  Pruritus   HISTORY OF PRESENTING ILLNESS:  Johnny [name] 77 y.o. male with medical history significant for coronary artery disease status post bypass, obstructive sleep apnea,  peripheral artery disease, hypertension, hyperlipidemia and abdominal aortic aneurysm.  He presents to the diagnostic clinic to evaluate for malignancy associated with diffuse pruritus.  He is unaccompanied for this visit.  On review of the previous records, Mr. Thornes presented with sudden onset diffuse pruritus in June 2023. Biopsy showed dermal hypersensitivity reaction.  His PCP has tried various interventions including stopping various medications and tired antihistamines/steroid cream without any improvement of symptoms.  Mr. Kalkowski reports that he continues to itchy diffusely. He does have bruising where he scratches his skin. He has tried various creams and lotions without any improvement. He denies any recent changes to his detergents, soaps or creams. He denies a rash associated with the itching.   He has no other symptoms. He has chronic fatigue but he continues to complete all his ADLs on his own. He reports weight loss over the last 4 years with diet and exercise. He denies nausea, vomiting or abdominal pain. He has intermittent episodes of constipation with straining. He adds having occasional episodes of mild hematochezia when he strains. He does have his fevers, chills, sweats, shortness of breath, chest pain, cough, headaches, dizziness or new lumps/bumps.  He has no other complaints.  Rest of the 10 point ROS below.  MEDICAL HISTORY:  Past  Medical History:  Diagnosis Date   Abdominal aortic aneurysm [facility])    Cardiac murmur    Coronary artery disease    Heart attack ([place])    Hemorrhoids    Occasional bleeding    Hiatal hernia    History of myocardial infarction 1992   History of rheumatic fever    Hyperlipidemia    Hypertension    Obstructive sleep apnea    Osteoarthritis of knee    Right knee   Peripheral arterial disease ([place])    status post aortobifemoral bypass grafting by Dr. Kellie Simmering in 2005   Sleep apnea     SURGICAL HISTORY: Past Surgical History:  Procedure Laterality Date   ABDOMINAL AORTIC ANEURYSM REPAIR  2005   CARDIAC CATHETERIZATION  1992   followed by open heart coronary artery bypass grafting of 4 vessels post MI   CHOLECYSTECTOMY     CORONARY ARTERY BYPASS GRAFT     REPLACEMENT TOTAL KNEE     Right knee cartilage surgery in 1976    SOCIAL HISTORY: Social History   Socioeconomic History   Marital status: Married    Spouse name: Not on file   Number of children: Not on file   Years of education: Not on file   Highest education level: Not on file  Occupational History   Not on file  Tobacco Use   Smoking status: Former    Types: Cigarettes    Quit date: 08/26/1990    Years since quitting: 31.9   Smokeless tobacco: Never  Vaping Use   Vaping Use: Never used  Substance and Sexual Activity   Alcohol use: Yes    Comment: occassional    Drug use: No   Sexual activity: Not Currently  Other Topics Concern   Not on file  Social History Narrative   Not on file   Social Determinants of Health   Financial Resource Strain: Not on file  Food Insecurity: Not on file  Transportation Needs: Not on file  Physical Activity: Not on file  Stress: Not on file  Social Connections: Not on file  Intimate Partner Violence: Not on file    FAMILY HISTORY: Family History  Problem Relation Age of Onset   Dementia Mother    Dementia Father     ALLERGIES:  is allergic to  statins.  MEDICATIONS:  Current Outpatient Medications  Medication Sig Dispense Refill   amLODipine (NORVASC) 10 MG tablet Take 1 tablet (10 mg total) by mouth daily. 90 tablet 1   amoxicillin (AMOXIL) 500 MG capsule SMARTSIG:4 Capsule(s) By Mouth     aspirin 325 MG tablet Take 83 mg by mouth daily.     clobetasol (TEMOVATE) 0.05 % external solution APPLY TO AFFECTED AREA AS DIRECTED 50 mL 1   clobetasol cream (TEMOVATE) 0.05 % Apply to skin 1-2 times daily not for face or skin folds 30 days per tube 60 g 3APPLY   Cyanocobalamin (VITAMIN B12) 1000 MCG TBCR 1 tablet     doxepin (SINEQUAN) 25 MG capsule Take 25 mg by mouth at bedtime.     famotidine (PEPCID) 40 MG tablet Take 40 mg by mouth daily.     HYDROCHLOROTHIAZIDE PO Take by mouth.     latanoprost (XALATAN) 0.005 % ophthalmic solution 1 drop at bedtime.     latanoprost (XALATAN) 0.005 % ophthalmic solution 1 drop into affected eye in the evening     metFORMIN (GLUCOPHAGE) 500 MG tablet Take 500 mg by mouth daily.     olmesartan (BENICAR) 40 MG tablet Take 1 tablet (40 mg total) by mouth daily. 90 tablet 1   simvastatin (ZOCOR) 20 MG tablet Take 1 tablet (20 mg total) by mouth daily. 90 tablet 1   triamcinolone cream (KENALOG) 0.1 % Apply topically.     chlorthalidone (HYGROTON) 25 MG tablet Take 0.5 tablets (12.5 mg total) by mouth daily. (Patient not taking: Reported on 07/14/2022) 45 tablet 3   ezetimibe (ZETIA) 10 MG tablet Take 1 tablet (10 mg total) by mouth daily. 90 tablet 3   No current facility-administered medications for this visit.    REVIEW OF SYSTEMS:   Constitutional: ( - ) fevers, ( - )  chills , ( - ) night sweats Eyes: ( - ) blurriness of vision, ( - ) double vision, ( - ) watery eyes Ears, nose, mouth, throat, and face: ( - ) mucositis, ( - ) sore throat Respiratory: ( - ) cough, ( - ) dyspnea, ( - ) wheezes Cardiovascular: ( - ) palpitation, ( - ) chest discomfort, ( - ) lower extremity  swelling Gastrointestinal:  ( - ) nausea, ( - ) heartburn, ( - ) change in bowel habits Skin: ( - ) abnormal skin rashes Lymphatics: ( - ) new lymphadenopathy, ( - ) easy bruising Neurological: ( - ) numbness, ( - ) tingling, ( - ) new weaknesses Behavioral/Psych: ( - ) mood change, ( - ) new changes  All other systems were reviewed with the patient and are negative.  PHYSICAL EXAMINATION: ECOG PERFORMANCE STATUS: 1 - Symptomatic but completely ambulatory  Vitals:   07/14/22 1123  BP: (!) 151/64  Pulse: 77  Resp: 14  Temp: 97.9 F (36.6 C)  SpO2: 98%   Filed Weights  07/14/22 1123  Weight: 209 lb 12.8 oz (95.2 kg)    GENERAL: well appearing male in NAD  SKIN: skin color, texture, turgor are normal. Diffuse excoriation with bruising involving upper and lower extremities, chest, back and abdomen.  EYES: conjunctiva are pink and non-injected, sclera clear OROPHARYNX: no exudate, no erythema; lips, buccal mucosa, and tongue normal  NECK: supple, non-tender LYMPH:  no palpable lymphadenopathy in the cervical, axillary or supraclavicular lymph nodes.  LUNGS: clear to auscultation and percussion with normal breathing effort HEART: regular rate & rhythm and no murmurs and no lower extremity edema ABDOMEN: soft, non-tender, non-distended, normal bowel sounds Musculoskeletal: no cyanosis of digits and no clubbing  PSYCH: alert & oriented x 3, fluent speech NEURO: no focal motor/sensory deficits  LABORATORY DATA:  I have reviewed the data as listed    Latest Ref Rng & Units 09/26/2011   10:40 PM 11/13/2010    5:00 AM 11/12/2010    5:45 AM  CBC  WBC 4.0 - 10.5 K/uL 7.9  8.3  7.9   Hemoglobin 13.0 - 17.0 g/dL 14.3  9.8  10.5   Hematocrit 39.0 - 52.0 % 41.8  28.8  31.2   Platelets 150 - 400 K/uL 172  142  144        Latest Ref Rng & Units 06/04/2020   12:31 PM 01/26/2019   11:09 AM 08/04/2018   11:08 AM  CMP  Glucose 65 - 99 mg/dL 107   112   BUN 8 - 27 mg/dL 18   18    Creatinine 0.76 - 1.27 mg/dL 1.45   1.35   Sodium 134 - 144 mmol/L 132   135   Potassium 3.5 - 5.2 mmol/L 4.1   4.7   Chloride 96 - 106 mmol/L 94   97   CO2 20 - 29 mmol/L 23   20   Calcium 8.6 - 10.2 mg/dL 9.5   9.4   Total Protein 6.0 - 8.5 g/dL  6.9    Total Bilirubin 0.0 - 1.2 mg/dL  0.9    Alkaline Phos 39 - 117 IU/L  85    AST 0 - 40 IU/L  21    ALT 0 - 44 IU/L  18       RADIOGRAPHIC STUDIES: I have personally reviewed the radiological images as listed and agreed with the findings in the report. No results found.  ASSESSMENT & PLAN MARKALE FINNER is a 77 y.o. male who presents to the diagnostic clinic for evaluation of malignancy that is causing diffuse pruritus.   #Diffuse pruritis: --Etiology unknown --Discussed malignancies associated with pruritus including lymphoproliferative disorder and malignancy associated with obstructive jaundice.  --Patient is up to date on his age appropriate cancer screenings --Labs today to check CBC, CMP, flow cytometry, LDH, ESR and CRP levels --No suspicious findings noted on physical exam including no evidence of palpable masses.  --Encouraged to apply lotion on skin and avoid hot showers.  --If today's workup is negative, recommend referral to allergist for further testing.   #Bruising: --Likely secondary to aspirin therapy --No signs of bleeding --Monitor for now.   No orders of the defined types were placed in this encounter.   All questions were answered. The patient knows to call the clinic with any problems, questions or concerns.  I have spent a total of 60 minutes minutes of face-to-face and non-face-to-face time, preparing to see the patient, obtaining and/or reviewing separately obtained history, performing a medically appropriate examination, counseling  and educating the patient, ordering tests/procedures, documenting clinical information in the electronic health record, and care coordination.   Dede Query,  PA-C Department of Hematology/Oncology [place] at [facility] Phone: [phone]  Patient was seen with Dr. Lorenso Courier  I have read the above note and personally examined the patient. I agree with the assessment and plan as noted above.  Briefly Mr. Abrahams is a 77 year old male with medical history significant for diffuse pruritus.  He has been under the care of a dermatologist has not been able to find a clear etiology and referred them to our clinic in order to evaluate for possible underlying malignancy.  It is true that pruritus can be a sign of malignancy, most commonly Hodgkin's lymphoma.  Today we will order a full battery of labs including CMP, CBC, LDH as well as flow cytometry, CRP, and ESR.  In the event that there are concerning findings on her blood work we could pursue a CT chest abdomen pelvis in order to look for underlying malignancy.  The patient voiced understanding of our findings and the plan moving forward.   Ledell Peoples, MD Department of Hematology/Oncology [place] at [facility] Phone: [phone] Pager: [phone] Email: [email or web address]

## 2022-07-15 LAB — SURGICAL PATHOLOGY

## 2022-07-16 ENCOUNTER — Encounter: Payer: Self-pay | Admitting: Physician Assistant

## 2022-07-16 ENCOUNTER — Telehealth: Payer: Self-pay | Admitting: Physician Assistant

## 2022-07-16 DIAGNOSIS — R7402 Elevation of levels of lactic acid dehydrogenase (LDH): Secondary | ICD-10-CM

## 2022-07-16 DIAGNOSIS — L299 Pruritus, unspecified: Secondary | ICD-10-CM

## 2022-07-16 LAB — FLOW CYTOMETRY

## 2022-07-16 MED ORDER — HYDROXYZINE HCL 10 MG PO TABS: 10.0000 mg | ORAL_TABLET | Freq: Three times a day (TID) | ORAL | 0 refills | Status: DC | PRN

## 2022-07-16 NOTE — Telephone Encounter (Signed)
I spoke to Johnny [name] to review the lab results from 07/14/2022. Due to elevated LDH levels and minor B cell population identified, we recommend obtaining CT CAP to evaluate for lymphoproliferative disorders. Patient is struggling with persistent pruritus so I will send a prescription for atarax to hopefully improve symptoms. I will follow up with the patient once CT CAP is reviewed. Johnny [name] expressed understanding of the plan provided.

## 2022-07-21 ENCOUNTER — Encounter (HOSPITAL_BASED_OUTPATIENT_CLINIC_OR_DEPARTMENT_OTHER): Payer: Self-pay

## 2022-07-21 ENCOUNTER — Ambulatory Visit (HOSPITAL_BASED_OUTPATIENT_CLINIC_OR_DEPARTMENT_OTHER)
Admission: RE | Admit: 2022-07-21 | Discharge: 2022-07-21 | Disposition: A | Discharge status: Home / Self Care | Payer: Medicare Other | Source: Ambulatory Visit | Attending: Physician Assistant | Admitting: Physician Assistant

## 2022-07-21 DIAGNOSIS — I700 Atherosclerosis of aorta: Secondary | ICD-10-CM | POA: Insufficient documentation

## 2022-07-21 DIAGNOSIS — J9811 Atelectasis: Secondary | ICD-10-CM | POA: Diagnosis not present

## 2022-07-21 DIAGNOSIS — R7402 Elevation of levels of lactic acid dehydrogenase (LDH): Secondary | ICD-10-CM | POA: Diagnosis not present

## 2022-07-21 DIAGNOSIS — L299 Pruritus, unspecified: Secondary | ICD-10-CM | POA: Insufficient documentation

## 2022-07-21 DIAGNOSIS — I517 Cardiomegaly: Secondary | ICD-10-CM | POA: Diagnosis not present

## 2022-07-21 DIAGNOSIS — I2510 Atherosclerotic heart disease of native coronary artery without angina pectoris: Secondary | ICD-10-CM | POA: Diagnosis not present

## 2022-07-21 DIAGNOSIS — K760 Fatty (change of) liver, not elsewhere classified: Secondary | ICD-10-CM | POA: Diagnosis not present

## 2022-07-21 DIAGNOSIS — I7121 Aneurysm of the ascending aorta, without rupture: Secondary | ICD-10-CM | POA: Insufficient documentation

## 2022-07-21 MED ORDER — IOHEXOL 300 MG/ML  SOLN
100.0000 mL | Freq: Once | INTRAMUSCULAR | Status: AC | PRN
  Administered 2022-07-21: 100 mL via INTRAVENOUS

## 2022-07-24 ENCOUNTER — Telehealth: Payer: Self-pay | Admitting: Physician Assistant

## 2022-07-24 ENCOUNTER — Other Ambulatory Visit: Payer: Self-pay

## 2022-07-24 ENCOUNTER — Telehealth: Payer: Self-pay

## 2022-07-24 DIAGNOSIS — L299 Pruritus, unspecified: Secondary | ICD-10-CM

## 2022-07-24 NOTE — Telephone Encounter (Signed)
I called Mr. Johnny [name] to review the CT CAP results form 07/21/2022. Findings showed no evidence of malignancy or biliary obstructive to explain the diffuse pruritus. No further hematologic/oncologic workup is required at this time. Recommend referral to allergy specialist for further evaluation. Patient expressed understanding of the plan provided.

## 2022-07-24 NOTE — Telephone Encounter (Signed)
referral to allergy and asthma specialist in [place] faxed to [facility] [place]    Confirmation received

## 2022-07-29 DIAGNOSIS — G4733 Obstructive sleep apnea (adult) (pediatric): Secondary | ICD-10-CM | POA: Diagnosis not present

## 2022-08-12 ENCOUNTER — Other Ambulatory Visit: Payer: Self-pay

## 2022-08-12 ENCOUNTER — Encounter: Payer: Self-pay | Admitting: Allergy

## 2022-08-12 ENCOUNTER — Ambulatory Visit: Payer: Medicare Other | Admitting: Allergy

## 2022-08-12 VITALS — BP 134/68 | HR 83 | Temp 98.0°F | Resp 16 | Ht 67.0 in | Wt 210.3 lb

## 2022-08-12 DIAGNOSIS — L299 Pruritus, unspecified: Secondary | ICD-10-CM | POA: Diagnosis not present

## 2022-08-12 NOTE — Patient Instructions (Addendum)
Chronic itching  - you have had dermatology evaluation as well as hematology/oncology evaluation for chronic itching  - will perform allergy evaluation with environmental allergens and common food allergens as well as tryptase level  - consider patch testing once able to control itch and have clearer areas on back to perform.  See below in regards to patch testing.   - continue Pepcid  daily.  This has antihistamine properties.   - use Doxepin  at bedtime.  This has antihistamine properties as well as helps with anxiety management  - recommend taking during the daytime a longer acting antihistamine like Ryvent  1-2 times a day.  This is a prescription based antihistamine that may work better than OTC Claritin, Zyrtec, Xyzal and Allegra  - daily moisturization after bathing with a thick lotion/emollient like Cerave, Cetaphil, Aveeno, Vanicream, Aquafor, Vaseline.    Provided with sample of Epiceram which is prescription based moisturizing agent.  Let me know if this moisturizing agent is more helpful than OTC moisturizers.   - continue as needed use of topical steroid Triamcinolone   Follow-up in 3-4 months or sooner if needed  Contact dermatitis (contact allergy) - patch testing is the test of choice to evaluate for contact dermatitis.  Recommend performing patch testing with the TRUE test patch panels.  Patches are best placed on a Monday with return to office on Wednesday and Friday of same week for readings.  Once patches are in place to do not get them wet.  You can take antihistamines while patches are in place.   True Test looks for the following sensitivities:

## 2022-08-12 NOTE — Progress Notes (Signed)
New Patient Note  RE: Johnny [name] MRN: [id number] DOB: [birth year] Date of Office Visit: 08/12/2022  Primary care provider: Daisy Floro, MD  Chief Complaint: Itching  History of present illness: Johnny [name] is a 77 y.o. male presenting today for evaluation of chronic pruritus.  He has had chronic itching that started last June 2023.  He has been to 2 dermatologist about this itching.  Currently seeing Dr. Doreen Beam.   He has had a biopsy for the itch.  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   He has been evaluated by heme-onc for chronic itch to rule out concern for malignancy which has been done.  No concerns or signs of malignancy through imaging where he has had a chest CT and abdomen.  Via blood work evaluation he did have an elevated LDH and moderate B-cell population on the path report of his flow cytometry that showed a lymphoid population with predominance of T lymphocytes with nonspecific changes and a minor B-cell population.  These lymphoid changes are not considered specific or diagnostic of a lymphoproliferative process.  No further recommended testing per hematology oncology.  He states the itching has gotten so bad that he wakes up bleeding due to scratching and this bruises his skin. He states sometimes if he is busy at work he doesn't really think about the itch.  He feels like he has become weaker over the past couple of months.  His wife has Alzheimers and he has not had any sleep last night.   He has taken different allergy medications and has tried claritin, zyrtec, allegra and xyzal he feels. He has a steroid injections that did not seem to provide much relief.   He states triamcinolone ointment is only thing that seems to help. He has clobetasol that he states he was using on his scalp only for about 2 weeks at a time.  He states his scalp has itched for past 50years but has itched more in the past 2 months.  He has not been able  to identify anything that seems to exacerbate the itching or any potential triggers.  He has not had any new medications.  He has not had any change of his diet and has not identified any foods that make the itching worse.  He does not believe he has changed anything in the past year in regards to detergents/soaps.   He has used Tide for years and dove soap as well.   He has not had any patch testing.   Review of systems: Review of Systems  Constitutional:  Positive for fatigue.  HENT: Negative.    Eyes: Negative.   Respiratory: Negative.    Cardiovascular: Negative.   Musculoskeletal: Negative.   Skin:  Positive for rash.  Allergic/Immunologic: Negative.   Neurological: Negative.     All other systems negative unless noted above in HPI  Past medical history: Past Medical History:  Diagnosis Date   Abdominal aortic aneurysm    Cardiac murmur    Coronary artery disease    Heart attack    Hemorrhoids    Occasional bleeding    Hiatal hernia    History of myocardial infarction 1992   History of rheumatic fever    Hyperlipidemia    Hypertension    Obstructive sleep apnea    Osteoarthritis of knee    Right knee   Peripheral arterial disease    status post aortobifemoral bypass grafting by Dr. Hart Rochester in 2005  Sleep apnea     Past surgical history: Past Surgical History:  Procedure Laterality Date   ABDOMINAL AORTIC ANEURYSM REPAIR  2005   CARDIAC CATHETERIZATION  1992   followed by open heart coronary artery bypass grafting of 4 vessels post MI   CHOLECYSTECTOMY     CORONARY ARTERY BYPASS GRAFT     REPLACEMENT TOTAL KNEE     Right knee cartilage surgery in 1976    Family history:  Family History  Problem Relation Age of Onset   Dementia Mother    Dementia Father     Social history: Lives in a home with carpeting in the bedroom with heat pump heating and central cooling.  Cat in the home.  There is no concern for water damage, mildew or roaches in the home.  He  works in [employer].  He has a smoking history from 19 62-19 89 of cigarette use.   Medication List: Current Outpatient Medications  Medication Sig Dispense Refill   amLODipine (NORVASC) 10 MG tablet Take 1 tablet (10 mg total) by mouth daily. 90 tablet 1   clobetasol (TEMOVATE) 0.05 % external solution APPLY TO AFFECTED AREA AS DIRECTED 50 mL 1   clobetasol cream (TEMOVATE) 0.05 % Apply to skin 1-2 times daily not for face or skin folds 30 days per tube 60 g 3APPLY   Cyanocobalamin (VITAMIN B12) 1000 MCG TBCR 1 tablet     doxepin (SINEQUAN) 25 MG capsule Take 25 mg by mouth at bedtime.     famotidine (PEPCID) 40 MG tablet Take 40 mg by mouth daily.     hydrochlorothiazide (HYDRODIURIL) 25 MG tablet Take 25 mg by mouth daily.     latanoprost (XALATAN) 0.005 % ophthalmic solution 1 drop at bedtime.     latanoprost (XALATAN) 0.005 % ophthalmic solution 1 drop into affected eye in the evening     metFORMIN (GLUCOPHAGE) 500 MG tablet Take 500 mg by mouth daily.     olmesartan (BENICAR) 40 MG tablet Take 1 tablet (40 mg total) by mouth daily. 90 tablet 1   simvastatin (ZOCOR) 20 MG tablet Take 1 tablet (20 mg total) by mouth daily. 90 tablet 1   triamcinolone cream (KENALOG) 0.1 % Apply topically.     ezetimibe (ZETIA) 10 MG tablet Take 1 tablet (10 mg total) by mouth daily. 90 tablet 3   No current facility-administered medications for this visit.    Known medication allergies: Allergies  Allergen Reactions   Statins Other (See Comments)    Intolerance      Physical examination: Blood pressure 134/68, pulse 83, temperature 98 F (36.7 C), temperature source Temporal, resp. rate 16, height 5\' 7"  (1.702 m), weight 210 lb 4.8 oz (95.4 kg), SpO2 96 %.  General: Alert, interactive, in no acute distress. HEENT: PERRLA, TMs pearly gray, turbinates non-edematous without discharge, post-pharynx non erythematous. Neck: Supple without lymphadenopathy. Lungs: Clear to auscultation without  wheezing, rhonchi or rales. {no increased work of breathing. CV: Normal S1, S2 without murmurs. Abdomen: Nondistended, nontender. Skin: Large ecchymoses over lower extremities and upper extremities primarily forearms.  Back and abdomen covered with numerous excoriated lesions and erythematous macules . Extremities:  No clubbing, cyanosis or edema. Neuro:   Grossly intact.  Diagnositics/Labs: Component     Latest Ref Rng 07/14/2022  WBC     4.0 - 10.5 K/uL 6.6   RBC     4.22 - 5.81 MIL/uL 3.50 (L)   Hemoglobin     13.0 - 17.0 g/dL  12.2 (L)   HCT     39.0 - 52.0 % 35.0 (L)   MCV     80.0 - 100.0 fL 100.0   MCH     26.0 - 34.0 pg 34.9 (H)   MCHC     30.0 - 36.0 g/dL 40.9   RDW     81.1 - 91.4 % 12.2   Platelets     150 - 400 K/uL 179   nRBC     0.0 - 0.2 % 0.0   Neutrophils     % 77   NEUT#     1.7 - 7.7 K/uL 5.1   Lymphocytes     % 10   Lymphocyte #     0.7 - 4.0 K/uL 0.7   Monocytes Relative     % 10   Monocyte #     0.1 - 1.0 K/uL 0.6   Eosinophil     % 2   Eosinophils Absolute     0.0 - 0.5 K/uL 0.1   Basophil     % 1   Basophils Absolute     0.0 - 0.1 K/uL 0.0   Immature Granulocytes     % 0   Abs Immature Granulocytes     0.00 - 0.07 K/uL 0.02   Sodium     135 - 145 mmol/L 135   Potassium     3.5 - 5.1 mmol/L 4.1   Chloride     98 - 111 mmol/L 96 (L)   CO2     22 - 32 mmol/L 28   Glucose     70 - 99 mg/dL 782 (H)   BUN     8 - 23 mg/dL 21   Creatinine     9.56 - 1.24 mg/dL 2.13 (H)   Calcium     8.9 - 10.3 mg/dL 08.6   Total Protein     6.5 - 8.1 g/dL 7.6   Albumin     3.5 - 5.0 g/dL 4.4   AST     15 - 41 U/L 26   ALT     0 - 44 U/L 21   Alkaline Phosphatase     38 - 126 U/L 85   Total Bilirubin     0.3 - 1.2 mg/dL 0.9   GFR, Est Non African American     >60 mL/min 52 (L)   Anion gap     5 - 15  11   CRP     <1.0 mg/dL <5.7   Sed Rate     0 - 16 mm/hr 37 (H)   Flow Cytometry SEE SEPARATE REPORT   LDH     98 - 192 U/L 226  (H)     Assessment and plan: Chronic pruritus  - you have had dermatology evaluation as well as hematology/oncology evaluation for chronic itching  - will perform allergy evaluation with environmental allergens and common food allergens as well as tryptase level  - consider patch testing once able to control itch and have clearer areas on back to perform.  See below in regards to patch testing.   - continue Pepcid 40mg  daily.  This has antihistamine properties.   - use Doxepin 25mg  at bedtime.  This has antihistamine properties as well as helps with anxiety management  - recommend taking during the daytime a longer acting antihistamine like Ryvent 6mg  1-2 times a day.  This is a prescription based antihistamine that may work better than OTC  Claritin, Zyrtec, Xyzal and Allegra  - daily moisturization after bathing with a thick lotion/emollient like Cerave, Cetaphil, Aveeno, Vanicream, Aquafor, Vaseline.    Provided with sample of Epiceram which is prescription based moisturizing agent.  Let me know if this moisturizing agent is more helpful than OTC moisturizers.   - continue as needed use of topical steroid Triamcinolone   Follow-up in 3-4 months or sooner if needed  Contact dermatitis (contact allergy) - patch testing is the test of choice to evaluate for contact dermatitis.  Recommend performing patch testing with the TRUE test patch panels.  Patches are best placed on a Monday with return to office on Wednesday and Friday of same week for readings.  Once patches are in place to do not get them wet.  You can take antihistamines while patches are in place.   True Test looks for the following sensitivities:      I appreciate the opportunity to take part in Averie's care. Please do not hesitate to contact me with questions.  Sincerely,   Margo Aye, MD Allergy/Immunology [facility]

## 2022-08-17 LAB — ALLERGENS W/TOTAL IGE AREA 2

## 2022-08-17 LAB — ALPHA-GAL PANEL
Allergen Lamb IgE: <0.1 kU/L
Beef IgE: <0.1 kU/L
IgE (Immunoglobulin E), Serum: 461 IU/mL (ref 6–495)
O215-IgE Alpha-Gal: <0.1 kU/L
Pork IgE: <0.1 kU/L

## 2022-08-17 LAB — IGE FOOD BASIC W/COMPONENT RFX
Codfish IgE: <0.1 kU/L
F001-IgE Egg White: 0.18 kU/L — AB
F002-IgE Milk: 0.12 kU/L — AB
Peanut, IgE: <0.1 kU/L
Soybean IgE: <0.1 kU/L
Wheat IgE: <0.1 kU/L

## 2022-08-17 LAB — TRYPTASE: Tryptase: 6.9 ug/L (ref 2.2–13.2)

## 2022-08-20 ENCOUNTER — Other Ambulatory Visit: Payer: Self-pay

## 2022-08-20 MED ORDER — GABAPENTIN 100 MG PO CAPS
100.0000 mg | ORAL_CAPSULE | Freq: Every day | ORAL | 5 refills | Status: AC
Start: 2022-08-20 — End: ?

## 2022-08-25 DIAGNOSIS — R7303 Prediabetes: Secondary | ICD-10-CM | POA: Diagnosis not present

## 2022-08-25 DIAGNOSIS — L308 Other specified dermatitis: Secondary | ICD-10-CM | POA: Diagnosis not present

## 2022-08-25 DIAGNOSIS — K219 Gastro-esophageal reflux disease without esophagitis: Secondary | ICD-10-CM | POA: Diagnosis not present

## 2022-08-25 DIAGNOSIS — N1831 Chronic kidney disease, stage 3a: Secondary | ICD-10-CM | POA: Diagnosis not present

## 2022-08-25 DIAGNOSIS — I700 Atherosclerosis of aorta: Secondary | ICD-10-CM | POA: Diagnosis not present

## 2022-08-25 DIAGNOSIS — Z0000 Encounter for general adult medical examination without abnormal findings: Secondary | ICD-10-CM | POA: Diagnosis not present

## 2022-08-25 DIAGNOSIS — I10 Essential (primary) hypertension: Secondary | ICD-10-CM | POA: Diagnosis not present

## 2022-08-25 DIAGNOSIS — E7800 Pure hypercholesterolemia, unspecified: Secondary | ICD-10-CM | POA: Diagnosis not present

## 2022-08-25 DIAGNOSIS — I739 Peripheral vascular disease, unspecified: Secondary | ICD-10-CM | POA: Diagnosis not present

## 2022-08-25 DIAGNOSIS — I714 Abdominal aortic aneurysm, without rupture, unspecified: Secondary | ICD-10-CM | POA: Diagnosis not present

## 2022-08-25 DIAGNOSIS — L89312 Pressure ulcer of right buttock, stage 2: Secondary | ICD-10-CM | POA: Diagnosis not present

## 2022-08-28 ENCOUNTER — Ambulatory Visit: Payer: Medicare Other | Admitting: Allergy

## 2022-08-31 DIAGNOSIS — Z0000 Encounter for general adult medical examination without abnormal findings: Secondary | ICD-10-CM | POA: Diagnosis not present

## 2022-08-31 DIAGNOSIS — L308 Other specified dermatitis: Secondary | ICD-10-CM | POA: Diagnosis not present

## 2022-08-31 DIAGNOSIS — L299 Pruritus, unspecified: Secondary | ICD-10-CM | POA: Diagnosis not present

## 2022-08-31 DIAGNOSIS — R609 Edema, unspecified: Secondary | ICD-10-CM | POA: Diagnosis not present

## 2022-09-12 ENCOUNTER — Inpatient Hospital Stay (HOSPITAL_BASED_OUTPATIENT_CLINIC_OR_DEPARTMENT_OTHER)
Admission: EM | Admit: 2022-09-12 | Discharge: 2022-09-24 | DRG: 466 | Disposition: A | Discharge status: Hospice | Payer: Medicare Other | Attending: Internal Medicine | Admitting: Internal Medicine

## 2022-09-12 ENCOUNTER — Emergency Department (HOSPITAL_BASED_OUTPATIENT_CLINIC_OR_DEPARTMENT_OTHER): Payer: Medicare Other

## 2022-09-12 ENCOUNTER — Other Ambulatory Visit: Payer: Self-pay

## 2022-09-12 ENCOUNTER — Encounter (HOSPITAL_BASED_OUTPATIENT_CLINIC_OR_DEPARTMENT_OTHER): Payer: Self-pay

## 2022-09-12 DIAGNOSIS — E1165 Type 2 diabetes mellitus with hyperglycemia: Secondary | ICD-10-CM | POA: Diagnosis present

## 2022-09-12 DIAGNOSIS — E440 Moderate protein-calorie malnutrition: Secondary | ICD-10-CM

## 2022-09-12 DIAGNOSIS — L299 Pruritus, unspecified: Secondary | ICD-10-CM | POA: Diagnosis present

## 2022-09-12 DIAGNOSIS — I7102 Dissection of abdominal aorta: Secondary | ICD-10-CM | POA: Diagnosis present

## 2022-09-12 DIAGNOSIS — I714 Abdominal aortic aneurysm, without rupture, unspecified: Secondary | ICD-10-CM | POA: Diagnosis not present

## 2022-09-12 DIAGNOSIS — I5033 Acute on chronic diastolic (congestive) heart failure: Secondary | ICD-10-CM

## 2022-09-12 DIAGNOSIS — B9561 Methicillin susceptible Staphylococcus aureus infection as the cause of diseases classified elsewhere: Secondary | ICD-10-CM | POA: Diagnosis not present

## 2022-09-12 DIAGNOSIS — R2240 Localized swelling, mass and lump, unspecified lower limb: Secondary | ICD-10-CM | POA: Diagnosis not present

## 2022-09-12 DIAGNOSIS — I469 Cardiac arrest, cause unspecified: Secondary | ICD-10-CM | POA: Diagnosis not present

## 2022-09-12 DIAGNOSIS — I25112 Atherosclerotic heart disease of native coronary artery with refractory angina pectoris: Secondary | ICD-10-CM | POA: Diagnosis not present

## 2022-09-12 DIAGNOSIS — I509 Heart failure, unspecified: Secondary | ICD-10-CM

## 2022-09-12 DIAGNOSIS — I330 Acute and subacute infective endocarditis: Secondary | ICD-10-CM | POA: Diagnosis not present

## 2022-09-12 DIAGNOSIS — E1122 Type 2 diabetes mellitus with diabetic chronic kidney disease: Secondary | ICD-10-CM | POA: Diagnosis present

## 2022-09-12 DIAGNOSIS — G4733 Obstructive sleep apnea (adult) (pediatric): Secondary | ICD-10-CM | POA: Diagnosis not present

## 2022-09-12 DIAGNOSIS — E876 Hypokalemia: Secondary | ICD-10-CM | POA: Diagnosis present

## 2022-09-12 DIAGNOSIS — Z6832 Body mass index (BMI) 32.0-32.9, adult: Secondary | ICD-10-CM

## 2022-09-12 DIAGNOSIS — N1831 Chronic kidney disease, stage 3a: Secondary | ICD-10-CM

## 2022-09-12 DIAGNOSIS — I2489 Other forms of acute ischemic heart disease: Secondary | ICD-10-CM | POA: Diagnosis present

## 2022-09-12 DIAGNOSIS — I719 Aortic aneurysm of unspecified site, without rupture: Secondary | ICD-10-CM | POA: Diagnosis not present

## 2022-09-12 DIAGNOSIS — N179 Acute kidney failure, unspecified: Secondary | ICD-10-CM | POA: Diagnosis not present

## 2022-09-12 DIAGNOSIS — F05 Delirium due to known physiological condition: Secondary | ICD-10-CM | POA: Diagnosis not present

## 2022-09-12 DIAGNOSIS — I71012 Dissection of descending thoracic aorta: Secondary | ICD-10-CM | POA: Diagnosis present

## 2022-09-12 DIAGNOSIS — I7100 Dissection of unspecified site of aorta: Secondary | ICD-10-CM | POA: Diagnosis not present

## 2022-09-12 DIAGNOSIS — D539 Nutritional anemia, unspecified: Secondary | ICD-10-CM | POA: Diagnosis present

## 2022-09-12 DIAGNOSIS — R918 Other nonspecific abnormal finding of lung field: Secondary | ICD-10-CM | POA: Diagnosis not present

## 2022-09-12 DIAGNOSIS — Z7189 Other specified counseling: Secondary | ICD-10-CM | POA: Diagnosis not present

## 2022-09-12 DIAGNOSIS — E871 Hypo-osmolality and hyponatremia: Secondary | ICD-10-CM | POA: Diagnosis not present

## 2022-09-12 DIAGNOSIS — M00061 Staphylococcal arthritis, right knee: Secondary | ICD-10-CM | POA: Diagnosis not present

## 2022-09-12 DIAGNOSIS — Z7984 Long term (current) use of oral hypoglycemic drugs: Secondary | ICD-10-CM

## 2022-09-12 DIAGNOSIS — M009 Pyogenic arthritis, unspecified: Secondary | ICD-10-CM

## 2022-09-12 DIAGNOSIS — Z515 Encounter for palliative care: Secondary | ICD-10-CM

## 2022-09-12 DIAGNOSIS — D638 Anemia in other chronic diseases classified elsewhere: Secondary | ICD-10-CM | POA: Diagnosis present

## 2022-09-12 DIAGNOSIS — I129 Hypertensive chronic kidney disease with stage 1 through stage 4 chronic kidney disease, or unspecified chronic kidney disease: Secondary | ICD-10-CM | POA: Diagnosis not present

## 2022-09-12 DIAGNOSIS — E66811 Obesity, class 1: Secondary | ICD-10-CM | POA: Diagnosis present

## 2022-09-12 DIAGNOSIS — I7101 Dissection of ascending aorta: Secondary | ICD-10-CM | POA: Diagnosis not present

## 2022-09-12 DIAGNOSIS — Z9049 Acquired absence of other specified parts of digestive tract: Secondary | ICD-10-CM

## 2022-09-12 DIAGNOSIS — E785 Hyperlipidemia, unspecified: Secondary | ICD-10-CM | POA: Diagnosis present

## 2022-09-12 DIAGNOSIS — E669 Obesity, unspecified: Secondary | ICD-10-CM | POA: Diagnosis present

## 2022-09-12 DIAGNOSIS — Z8679 Personal history of other diseases of the circulatory system: Secondary | ICD-10-CM

## 2022-09-12 DIAGNOSIS — K5900 Constipation, unspecified: Secondary | ICD-10-CM | POA: Diagnosis present

## 2022-09-12 DIAGNOSIS — E782 Mixed hyperlipidemia: Secondary | ICD-10-CM | POA: Diagnosis not present

## 2022-09-12 DIAGNOSIS — Z7401 Bed confinement status: Secondary | ICD-10-CM | POA: Diagnosis not present

## 2022-09-12 DIAGNOSIS — Z1152 Encounter for screening for COVID-19: Secondary | ICD-10-CM | POA: Diagnosis not present

## 2022-09-12 DIAGNOSIS — Z951 Presence of aortocoronary bypass graft: Secondary | ICD-10-CM

## 2022-09-12 DIAGNOSIS — K219 Gastro-esophageal reflux disease without esophagitis: Secondary | ICD-10-CM | POA: Diagnosis present

## 2022-09-12 DIAGNOSIS — Z96651 Presence of right artificial knee joint: Secondary | ICD-10-CM | POA: Diagnosis not present

## 2022-09-12 DIAGNOSIS — A4101 Sepsis due to Methicillin susceptible Staphylococcus aureus: Secondary | ICD-10-CM | POA: Diagnosis present

## 2022-09-12 DIAGNOSIS — I252 Old myocardial infarction: Secondary | ICD-10-CM

## 2022-09-12 DIAGNOSIS — D72829 Elevated white blood cell count, unspecified: Secondary | ICD-10-CM | POA: Diagnosis present

## 2022-09-12 DIAGNOSIS — I10 Essential (primary) hypertension: Secondary | ICD-10-CM | POA: Diagnosis not present

## 2022-09-12 DIAGNOSIS — I519 Heart disease, unspecified: Secondary | ICD-10-CM | POA: Diagnosis not present

## 2022-09-12 DIAGNOSIS — I959 Hypotension, unspecified: Secondary | ICD-10-CM | POA: Diagnosis not present

## 2022-09-12 DIAGNOSIS — R7989 Other specified abnormal findings of blood chemistry: Secondary | ICD-10-CM | POA: Diagnosis not present

## 2022-09-12 DIAGNOSIS — R509 Fever, unspecified: Secondary | ICD-10-CM | POA: Diagnosis not present

## 2022-09-12 DIAGNOSIS — I76 Septic arterial embolism: Secondary | ICD-10-CM | POA: Diagnosis not present

## 2022-09-12 DIAGNOSIS — R58 Hemorrhage, not elsewhere classified: Secondary | ICD-10-CM | POA: Diagnosis not present

## 2022-09-12 DIAGNOSIS — Y831 Surgical operation with implant of artificial internal device as the cause of abnormal reaction of the patient, or of later complication, without mention of misadventure at the time of the procedure: Secondary | ICD-10-CM | POA: Diagnosis present

## 2022-09-12 DIAGNOSIS — T8453XA Infection and inflammatory reaction due to internal right knee prosthesis, initial encounter: Secondary | ICD-10-CM | POA: Diagnosis not present

## 2022-09-12 DIAGNOSIS — I71011 Dissection of aortic arch: Secondary | ICD-10-CM | POA: Diagnosis not present

## 2022-09-12 DIAGNOSIS — Z79899 Other long term (current) drug therapy: Secondary | ICD-10-CM

## 2022-09-12 DIAGNOSIS — I739 Peripheral vascular disease, unspecified: Secondary | ICD-10-CM | POA: Diagnosis present

## 2022-09-12 DIAGNOSIS — I440 Atrioventricular block, first degree: Secondary | ICD-10-CM | POA: Diagnosis present

## 2022-09-12 DIAGNOSIS — R079 Chest pain, unspecified: Secondary | ICD-10-CM | POA: Diagnosis not present

## 2022-09-12 DIAGNOSIS — I110 Hypertensive heart disease with heart failure: Secondary | ICD-10-CM | POA: Diagnosis not present

## 2022-09-12 DIAGNOSIS — Z713 Dietary counseling and surveillance: Secondary | ICD-10-CM

## 2022-09-12 DIAGNOSIS — I350 Nonrheumatic aortic (valve) stenosis: Secondary | ICD-10-CM | POA: Diagnosis present

## 2022-09-12 DIAGNOSIS — I25810 Atherosclerosis of coronary artery bypass graft(s) without angina pectoris: Secondary | ICD-10-CM | POA: Diagnosis not present

## 2022-09-12 DIAGNOSIS — M25461 Effusion, right knee: Secondary | ICD-10-CM | POA: Diagnosis not present

## 2022-09-12 DIAGNOSIS — G9340 Encephalopathy, unspecified: Secondary | ICD-10-CM | POA: Diagnosis not present

## 2022-09-12 DIAGNOSIS — I130 Hypertensive heart and chronic kidney disease with heart failure and stage 1 through stage 4 chronic kidney disease, or unspecified chronic kidney disease: Secondary | ICD-10-CM | POA: Diagnosis not present

## 2022-09-12 DIAGNOSIS — R7881 Bacteremia: Secondary | ICD-10-CM

## 2022-09-12 DIAGNOSIS — R0689 Other abnormalities of breathing: Secondary | ICD-10-CM | POA: Diagnosis not present

## 2022-09-12 DIAGNOSIS — Z66 Do not resuscitate: Secondary | ICD-10-CM | POA: Diagnosis not present

## 2022-09-12 DIAGNOSIS — M25561 Pain in right knee: Secondary | ICD-10-CM | POA: Diagnosis not present

## 2022-09-12 DIAGNOSIS — E1142 Type 2 diabetes mellitus with diabetic polyneuropathy: Secondary | ICD-10-CM | POA: Diagnosis present

## 2022-09-12 DIAGNOSIS — I2510 Atherosclerotic heart disease of native coronary artery without angina pectoris: Secondary | ICD-10-CM | POA: Diagnosis not present

## 2022-09-12 DIAGNOSIS — E1151 Type 2 diabetes mellitus with diabetic peripheral angiopathy without gangrene: Secondary | ICD-10-CM | POA: Diagnosis present

## 2022-09-12 DIAGNOSIS — G9341 Metabolic encephalopathy: Secondary | ICD-10-CM | POA: Diagnosis not present

## 2022-09-12 DIAGNOSIS — J90 Pleural effusion, not elsewhere classified: Secondary | ICD-10-CM | POA: Diagnosis not present

## 2022-09-12 DIAGNOSIS — Z888 Allergy status to other drugs, medicaments and biological substances status: Secondary | ICD-10-CM

## 2022-09-12 DIAGNOSIS — Z87891 Personal history of nicotine dependence: Secondary | ICD-10-CM

## 2022-09-12 DIAGNOSIS — R059 Cough, unspecified: Secondary | ICD-10-CM | POA: Diagnosis not present

## 2022-09-12 DIAGNOSIS — R739 Hyperglycemia, unspecified: Secondary | ICD-10-CM | POA: Diagnosis not present

## 2022-09-12 DIAGNOSIS — I358 Other nonrheumatic aortic valve disorders: Secondary | ICD-10-CM | POA: Diagnosis not present

## 2022-09-12 DIAGNOSIS — Z743 Need for continuous supervision: Secondary | ICD-10-CM | POA: Diagnosis not present

## 2022-09-12 DIAGNOSIS — I71019 Dissection of thoracic aorta, unspecified: Secondary | ICD-10-CM | POA: Diagnosis not present

## 2022-09-12 DIAGNOSIS — I700 Atherosclerosis of aorta: Secondary | ICD-10-CM | POA: Diagnosis present

## 2022-09-12 LAB — CBC WITH DIFFERENTIAL/PLATELET
Abs Immature Granulocytes: 0.11 10*3/uL — ABNORMAL HIGH (ref 0.00–0.07)
Basophils Absolute: 0 10*3/uL (ref 0.0–0.1)
Basophils Relative: 0 %
Eosinophils Absolute: 0 10*3/uL (ref 0.0–0.5)
Eosinophils Relative: 0 %
HCT: 30 % — ABNORMAL LOW (ref 39.0–52.0)
Hemoglobin: 10 g/dL — ABNORMAL LOW (ref 13.0–17.0)
Immature Granulocytes: 1 %
Lymphocytes Relative: 4 %
Lymphs Abs: 0.6 10*3/uL — ABNORMAL LOW (ref 0.7–4.0)
MCH: 33.4 pg (ref 26.0–34.0)
MCHC: 33.3 g/dL (ref 30.0–36.0)
MCV: 100.3 fL — ABNORMAL HIGH (ref 80.0–100.0)
Monocytes Absolute: 1.6 10*3/uL — ABNORMAL HIGH (ref 0.1–1.0)
Monocytes Relative: 12 %
Neutro Abs: 11.1 10*3/uL — ABNORMAL HIGH (ref 1.7–7.7)
Neutrophils Relative %: 83 %
Platelets: 244 10*3/uL (ref 150–400)
RBC: 2.99 MIL/uL — ABNORMAL LOW (ref 4.22–5.81)
RDW: 13.1 % (ref 11.5–15.5)
WBC: 13.4 10*3/uL — ABNORMAL HIGH (ref 4.0–10.5)
nRBC: 0 % (ref 0.0–0.2)

## 2022-09-12 LAB — TROPONIN I (HIGH SENSITIVITY)
Troponin I (High Sensitivity): 183 ng/L (ref <18)
Troponin I (High Sensitivity): 150 ng/L (ref <18)

## 2022-09-12 LAB — COMPREHENSIVE METABOLIC PANEL
ALT: 25 U/L (ref 0–44)
AST: 31 U/L (ref 15–41)
Albumin: 2.9 g/dL — ABNORMAL LOW (ref 3.5–5.0)
Alkaline Phosphatase: 81 U/L (ref 38–126)
Anion gap: 14 (ref 5–15)
BUN: 24 mg/dL — ABNORMAL HIGH (ref 8–23)
CO2: 24 mmol/L (ref 22–32)
Calcium: 8.2 mg/dL — ABNORMAL LOW (ref 8.9–10.3)
Chloride: 93 mmol/L — ABNORMAL LOW (ref 98–111)
Creatinine, Ser: 1.29 mg/dL — ABNORMAL HIGH (ref 0.61–1.24)
GFR, Estimated: 57 mL/min — ABNORMAL LOW (ref >60)
Glucose, Bld: 143 mg/dL — ABNORMAL HIGH (ref 70–99)
Potassium: 3.2 mmol/L — ABNORMAL LOW (ref 3.5–5.1)
Sodium: 131 mmol/L — ABNORMAL LOW (ref 135–145)
Total Bilirubin: 0.9 mg/dL (ref 0.3–1.2)
Total Protein: 7.1 g/dL (ref 6.5–8.1)

## 2022-09-12 LAB — MAGNESIUM: Magnesium: 1.7 mg/dL (ref 1.7–2.4)

## 2022-09-12 LAB — SARS CORONAVIRUS 2 BY RT PCR: SARS Coronavirus 2 by RT PCR: NEGATIVE

## 2022-09-12 LAB — BRAIN NATRIURETIC PEPTIDE: B Natriuretic Peptide: 131.6 pg/mL — ABNORMAL HIGH (ref 0.0–100.0)

## 2022-09-12 MED ORDER — FUROSEMIDE 10 MG/ML IJ SOLN
40.0000 mg | Freq: Once | INTRAMUSCULAR | Status: AC
  Administered 2022-09-12: 40 mg via INTRAVENOUS
  Filled 2022-09-12: qty 4

## 2022-09-12 MED ORDER — ENOXAPARIN SODIUM 40 MG/0.4ML IJ SOSY
40.0000 mg | PREFILLED_SYRINGE | Freq: Every day | INTRAMUSCULAR | Status: DC
  Administered 2022-09-13 (×2): 40 mg via SUBCUTANEOUS
  Filled 2022-09-12 (×2): qty 0.4

## 2022-09-12 MED ORDER — AMLODIPINE BESYLATE 10 MG PO TABS
10.0000 mg | ORAL_TABLET | Freq: Every day | ORAL | Status: DC
  Administered 2022-09-13: 10 mg via ORAL
  Filled 2022-09-12: qty 1

## 2022-09-12 MED ORDER — FAMOTIDINE 20 MG PO TABS
40.0000 mg | ORAL_TABLET | Freq: Every day | ORAL | Status: DC
  Administered 2022-09-13 – 2022-09-24 (×12): 40 mg via ORAL
  Filled 2022-09-12 (×13): qty 2

## 2022-09-12 MED ORDER — INSULIN ASPART 100 UNIT/ML IJ SOLN
0.0000 [IU] | Freq: Three times a day (TID) | INTRAMUSCULAR | Status: DC
  Administered 2022-09-13 (×2): 2 [IU] via SUBCUTANEOUS
  Administered 2022-09-14 – 2022-09-15 (×4): 1 [IU] via SUBCUTANEOUS
  Administered 2022-09-15 – 2022-09-16 (×3): 2 [IU] via SUBCUTANEOUS
  Administered 2022-09-16 – 2022-09-17 (×3): 1 [IU] via SUBCUTANEOUS
  Administered 2022-09-17 – 2022-09-19 (×3): 2 [IU] via SUBCUTANEOUS
  Administered 2022-09-19: 3 [IU] via SUBCUTANEOUS
  Administered 2022-09-20: 1 [IU] via SUBCUTANEOUS
  Administered 2022-09-20: 3 [IU] via SUBCUTANEOUS
  Administered 2022-09-21 (×3): 1 [IU] via SUBCUTANEOUS
  Administered 2022-09-22: 2 [IU] via SUBCUTANEOUS
  Administered 2022-09-22: 1 [IU] via SUBCUTANEOUS
  Administered 2022-09-22: 2 [IU] via SUBCUTANEOUS
  Administered 2022-09-23 (×3): 1 [IU] via SUBCUTANEOUS
  Administered 2022-09-24: 2 [IU] via SUBCUTANEOUS

## 2022-09-12 MED ORDER — POTASSIUM CHLORIDE 10 MEQ/100ML IV SOLN
10.0000 meq | Freq: Once | INTRAVENOUS | Status: AC
  Administered 2022-09-12: 10 meq via INTRAVENOUS
  Filled 2022-09-12: qty 100

## 2022-09-12 MED ORDER — KETOROLAC TROMETHAMINE 30 MG/ML IJ SOLN
15.0000 mg | Freq: Once | INTRAMUSCULAR | Status: AC
  Administered 2022-09-12: 15 mg via INTRAVENOUS
  Filled 2022-09-12: qty 1

## 2022-09-12 MED ORDER — LIDOCAINE-EPINEPHRINE 2 %-1:100000 IJ SOLN
20.0000 mL | Freq: Once | INTRAMUSCULAR | Status: DC
  Filled 2022-09-12: qty 20.4

## 2022-09-12 MED ORDER — LIDOCAINE-EPINEPHRINE (PF) 2 %-1:200000 IJ SOLN
INTRAMUSCULAR | Status: AC
  Filled 2022-09-12: qty 20

## 2022-09-12 MED ORDER — LIDOCAINE-EPINEPHRINE 2 %-1:100000 IJ SOLN: 30.0000 mL | Freq: Once | INTRAMUSCULAR | Status: DC

## 2022-09-12 MED ORDER — EZETIMIBE 10 MG PO TABS
10.0000 mg | ORAL_TABLET | Freq: Every day | ORAL | Status: DC
  Administered 2022-09-13 – 2022-09-24 (×12): 10 mg via ORAL
  Filled 2022-09-12 (×12): qty 1

## 2022-09-12 MED ORDER — INSULIN ASPART 100 UNIT/ML IJ SOLN: 0.0000 [IU] | Freq: Every day | INTRAMUSCULAR | Status: DC

## 2022-09-12 MED ORDER — SIMVASTATIN 20 MG PO TABS
20.0000 mg | ORAL_TABLET | Freq: Every day | ORAL | Status: DC
  Administered 2022-09-13 – 2022-09-14 (×2): 20 mg via ORAL
  Filled 2022-09-12 (×2): qty 1

## 2022-09-12 MED ORDER — POTASSIUM CHLORIDE CRYS ER 20 MEQ PO TBCR
40.0000 meq | EXTENDED_RELEASE_TABLET | Freq: Once | ORAL | Status: AC
  Administered 2022-09-13: 40 meq via ORAL
  Filled 2022-09-12: qty 2

## 2022-09-12 NOTE — ED Notes (Signed)
Called [facility] for transport at 3:50.

## 2022-09-12 NOTE — ED Triage Notes (Signed)
He is also complaining of abd pain

## 2022-09-12 NOTE — ED Triage Notes (Signed)
Patient is having swelling to legs and his left ring finger. He is also having some issues with itching and back pain for a year.

## 2022-09-12 NOTE — ED Provider Notes (Signed)
[place] [facility] Provider Note   CSN: [id number] Arrival date & time: 09/12/22  1230     History  Chief Complaint  Patient presents with   Foot Swelling   Abdominal Pain    Johnny [name] is a 77 y.o. male with past medical history significant for MI, PAD, aortic stenosis, CAD, HTN, AAA, hyperlipidemia presents to the ED complaining of swelling to hands and lower extremities, generalized weakness, dyspnea with exertion, and ongoing pruritus.  Patient states the symptoms have been worsening over the past few months, but he has noticed a drastic change in the past few weeks.  Patient does an appointment scheduled with his primary care provider on Monday, but son was concerned for patient's rapidly progressing symptoms.  Patient reports that he often has to sit down and rest after normal ADLs such as showering or walking to the mailbox.  He does feel short of breath with short distance walking.  He states he has sharp abdominal pains that are not localized when he has to cough.  He reports he has had a cough for the last several years.  Denies fever, chest pain, nausea, vomiting, diarrhea, syncope, lightheadedness, dizziness.       Home Medications Prior to Admission medications   Medication Sig Start Date End Date Taking? Authorizing Provider  gabapentin (NEURONTIN) 100 MG capsule Take 1 capsule (100 mg total) by mouth daily. 08/20/22   Marcelyn Bruins, MD  amLODipine (NORVASC) 10 MG tablet Take 1 tablet (10 mg total) by mouth daily. 06/09/18   Marykay Lex, MD  clobetasol (TEMOVATE) 0.05 % external solution APPLY TO AFFECTED AREA AS DIRECTED 09/23/21   Janalyn Harder, MD  clobetasol cream (TEMOVATE) 0.05 % Apply to skin 1-2 times daily not for face or skin folds 30 days per tube 11/03/21   Janalyn Harder, MD  Cyanocobalamin (VITAMIN B12) 1000 MCG TBCR 1 tablet    [provider]  doxepin (SINEQUAN) 25 MG capsule Take 25 mg by  mouth at bedtime. 10/24/21   [provider]  ezetimibe (ZETIA) 10 MG tablet Take 1 tablet (10 mg total) by mouth daily. 01/30/19 07/17/21  Runell Gess, MD  famotidine (PEPCID) 40 MG tablet Take 40 mg by mouth daily.    [provider]  hydrochlorothiazide (HYDRODIURIL) 25 MG tablet Take 25 mg by mouth daily. 08/05/22   [provider]  latanoprost (XALATAN) 0.005 % ophthalmic solution 1 drop at bedtime.    [provider]  latanoprost (XALATAN) 0.005 % ophthalmic solution 1 drop into affected eye in the evening    [provider]  metFORMIN (GLUCOPHAGE) 500 MG tablet Take 500 mg by mouth daily.    [provider]  olmesartan (BENICAR) 40 MG tablet Take 1 tablet (40 mg total) by mouth daily. 06/01/18   Marykay Lex, MD  simvastatin (ZOCOR) 20 MG tablet Take 1 tablet (20 mg total) by mouth daily. 06/01/18   Marykay Lex, MD  triamcinolone cream (KENALOG) 0.1 % Apply topically. 07/07/22   [provider]      Allergies    Statins    Review of Systems   Review of Systems  Constitutional:  Negative for fever.  Respiratory:  Positive for cough and shortness of breath.   Cardiovascular:  Positive for leg swelling. Negative for chest pain.  Gastrointestinal:  Positive for abdominal pain and constipation. Negative for diarrhea, nausea and vomiting.  Neurological:  Positive for weakness (generalized).  Negative for dizziness, syncope and light-headedness.    Physical Exam Updated Vital Signs BP (!) 146/70   Pulse 75   Temp 99.1 F (37.3 C) (Tympanic)   Resp (!) 23   Ht 5\' 7"  (1.702 m)   Wt 91.2 kg   SpO2 94%   BMI 31.48 kg/m  Physical Exam Vitals and nursing note reviewed.  Constitutional:      General: He is not in acute distress.    Appearance: He is ill-appearing. He is not toxic-appearing or diaphoretic.  HENT:     Mouth/Throat:     Mouth: Mucous membranes are moist.     Pharynx: Oropharynx is clear.   Cardiovascular:     Rate and Rhythm: Normal rate and regular rhythm.     Pulses: Normal pulses.     Heart sounds: Murmur heard.  Pulmonary:     Effort: Pulmonary effort is normal. No respiratory distress.     Breath sounds: Normal breath sounds and air entry.  Abdominal:     General: Abdomen is protuberant. Bowel sounds are normal. There is no distension.     Palpations: Abdomen is soft.     Tenderness: There is no abdominal tenderness.  Musculoskeletal:     Right lower leg: 2+ Pitting Edema present.     Left lower leg: 2+ Pitting Edema present.  Skin:    General: Skin is warm and dry.     Capillary Refill: Capillary refill takes less than 2 seconds.  Neurological:     Mental Status: He is alert. Mental status is at baseline.  Psychiatric:        Mood and Affect: Mood normal.        Behavior: Behavior normal.     ED Results / Procedures / Treatments   Labs (all labs ordered are listed, but only abnormal results are displayed) Labs Reviewed  CBC WITH DIFFERENTIAL/PLATELET - Abnormal; Notable for the following components:      Result Value   WBC 13.4 (*)    RBC 2.99 (*)    Hemoglobin 10.0 (*)    HCT 30.0 (*)    MCV 100.3 (*)    Neutro Abs 11.1 (*)    Lymphs Abs 0.6 (*)    Monocytes Absolute 1.6 (*)    Abs Immature Granulocytes 0.11 (*)    All other components within normal limits  COMPREHENSIVE METABOLIC PANEL - Abnormal; Notable for the following components:   Sodium 131 (*)    Potassium 3.2 (*)    Chloride 93 (*)    Glucose, Bld 143 (*)    BUN 24 (*)    Creatinine, Ser 1.29 (*)    Calcium 8.2 (*)    Albumin 2.9 (*)    GFR, Estimated 57 (*)    All other components within normal limits  BRAIN NATRIURETIC PEPTIDE - Abnormal; Notable for the following components:   B Natriuretic Peptide 131.6 (*)    All other components within normal limits  TROPONIN I (HIGH SENSITIVITY) - Abnormal; Notable for the following components:   Troponin I (High Sensitivity) 183 (*)     All other components within normal limits  SARS CORONAVIRUS 2 BY RT PCR  MAGNESIUM  TROPONIN I (HIGH SENSITIVITY)    EKG EKG Interpretation  Date/Time:  Saturday Sep 12 2022 13:21:44 EDT Ventricular Rate:  75 PR Interval:  200 QRS Duration: 102 QT Interval:  421 QTC Calculation: 471 R Axis:   19 Text Interpretation: Sinus rhythm Anteroseptal infarct, age indeterminate No significant  change since last tracing Confirmed by Jacalyn Lefevre [phone]) on 09/12/2022 2:21:13 PM  Radiology DG Chest 2 View  Result Date: 09/12/2022 CLINICAL DATA:  Lower extremity swelling, pruritis EXAM: CHEST - 2 VIEW COMPARISON:  09/12/2022 FINDINGS: Frontal and lateral views of the chest demonstrates stable enlargement of the cardiac silhouette. There is minimal bibasilar subsegmental atelectasis or scarring. A prominent epicardial fat pad along the left heart border on prior CT likely accounts for the density seen on earlier x-ray. No acute airspace disease, effusion, or pneumothorax. No acute bony abnormalities. Postsurgical changes from median sternotomy. IMPRESSION: 1. Chronic bibasilar scarring or atelectasis. No acute airspace disease. Electronically Signed   By: Sharlet Salina M.D.   On: 09/12/2022 15:10   DG Chest Port 1 View  Result Date: 09/12/2022 CLINICAL DATA:  Shortness of breath EXAM: PORTABLE CHEST 1 VIEW COMPARISON:  11/03/2010 chest radiograph FINDINGS: Telemetry leads overlie the chest. Cardiomegaly and CABG changes again noted. Ill-defined opacity overlying the LATERAL LEFT lung base noted and may represent atelectasis, scarring or nodule. No definite pleural effusion or pneumothorax noted. No acute bony abnormalities are identified. IMPRESSION: Ill-defined opacity overlying the LATERAL LEFT lung base which may represent atelectasis, scarring or nodule. Recommend PA and LATERAL chest radiograph clinically able Electronically Signed   By: Harmon Pier M.D.   On: 09/12/2022 13:57     Procedures Procedures    Medications Ordered in ED Medications  furosemide (LASIX) injection 40 mg (40 mg Intravenous Given 09/12/22 1426)  potassium chloride 10 mEq in 100 mL IVPB (0 mEq Intravenous Stopped 09/12/22 1616)    ED Course/ Medical Decision Making/ A&P                             Medical Decision Making Amount and/or Complexity of Data Reviewed Labs: ordered. Radiology: ordered.   This patient presents to the ED with chief complaint(s) of multiple complaints with pertinent past medical history of MI, PAD, CAD, hypertension, hyperlipidemia, aortic stenosis, murmur.  The complaint involves an extensive differential diagnosis and also carries with it a high risk of complications and morbidity.    The differential diagnosis includes ACS, acute onset CHF, pneumonia, infectious etiology, lymphedema  The initial plan is to obtain baseline labs, troponin, and BNP  Additional history obtained: Records reviewed  -patient has been seen by hematology/oncology and allergist regarding his chronic pruritus.  Patient has been trialed on multiple different medications without success in treating his pruritus.  There is also a note from primary care provider Dr. Tenny Craw where patient was to be switched from HCTZ to furosemide beginning on 09/14/22.  Initial Assessment:   Exam significant for ill-appearing patient who is not in acute distress.  Patient is speaking in full sentences and has normal pulmonary effort.  Lungs are clear to auscultation bilaterally.  Heart rate is normal in the 70s with regular rhythm, appreciable murmur.  Abdomen is protuberant, but soft and nontender to palpation.  Bilateral 2+ pitting edema lower extremities.  Patient does also have mild edema to both hands.  Most notably patient has severe redness and swelling to his left ring finger due to restriction from his wedding band.  Patient states his finger has been like this for the past 3 days.  Independent  ECG/labs interpretation:  The following labs were independently interpreted:  CBC with leukocytosis and macrocytic anemia.  Metabolic panel with multiple electrolyte disturbances including hyponatremia, hypokalemia.  Elevated creatinine, appears to be  at patient's baseline.  Troponin elevated at 183.  BNP 131.6.  COVID-negative.  Magnesium unremarkable.  Independent visualization and interpretation of imaging: I independently visualized the following imaging with scope of interpretation limited to determining acute life threatening conditions related to emergency care: chest x-ray, which revealed lower left atelectasis, no pleural effusion or edema.  I agree with radiologist interpretation.   Treatment and Reassessment: Patient given IV Lasix for hypervolemia.  Patient also given IV potassium due to hypokalemia.  Consultations obtained: I requested consultation with on-call hospitalist provider and spoke with Dr. Hanley Ben who agreed with hospital admission. I also requested consultation with on-call provider with [facility].  Disposition:   Patient to be admitted to [facility] for acute CHF.  Patient will be transported via [facility].  Discussed hospitalization and workup thus far with patient and his son at bedside.  Patient is in agreement with plan for hospital admission.           Final Clinical Impression(s) / ED Diagnoses Final diagnoses:  Acute congestive heart failure, unspecified heart failure type [facility])    Rx / DC Orders ED Discharge Orders     None         Lenard Simmer, PA-C 09/12/22 1657    Jacalyn Lefevre, MD 09/13/22 [phone]

## 2022-09-12 NOTE — ED Notes (Signed)
Ring removed from RT. ring finger without injury. Pt tolerated well.

## 2022-09-12 NOTE — H&P (Incomplete)
History and Physical  Johnny [name] WNU:[id number] DOB: 1946/03/24 DOA: 09/12/2022  Referring physician: Dr. Solon Augusta, [facility], Hospitalist service  PCP: Daisy Floro, MD  Outpatient Specialists: Cardiology, dermatology Patient coming from: Home through [facility] ED  Chief Complaint: Shortness of breath   HPI: Johnny [name] is a 77 y.o. male with medical history significant for coronary artery disease status post CABG, abdominal aortic aneurysm, peripheral vascular disease on aspirin 81 mg daily, essential hypertension, hyperlipidemia, chronic pruritus/dermal hypersensitivity reaction, OSA, obesity, who initially presented to [facility] ED with complaints of progressive shortness of breath over the past few months to weeks, associated with worsening bilateral upper and lower extremity edema.  He often gets short winded with his normal ADLs.  He additionally endorses a nonproductive cough and generalized pruritus, worse on his back.  In the ED, workup revealed elevated troponin which peaked at 183.  Elevated BNP 131.  Chest x-ray revealed lower left atelectasis, no pleural effusion or edema.  The patient received IV Lasix 40 mg x 1 in the ED.  Cardiology was consulted by EDP.  [facility], hospitalist service, was asked to admit.  Accepted by Dr. Hanley Ben, hospitalist, and transferred to [facility] telemetry cardiac unit as observation status.  No reported chest pain at the time of this visit.  His dyspnea was improved after first dose of IV Lasix.  Leukocytosis noted on lab tests review with elevated temperature 99.1, need to rule out active infective process, UA is pending.  ED Course: Tmax 99.1.  BP 130/69, pulse 84, respiratory 16, O2 saturation 97% on room air.  Review of Systems: Review of systems as noted in the HPI. All other systems reviewed and are negative.   Past Medical History:  Diagnosis Date   Abdominal aortic aneurysm [facility])    Cardiac murmur    Coronary artery disease    Heart attack  (HCC)    Hemorrhoids    Occasional bleeding    Hiatal hernia    History of myocardial infarction 1992   History of rheumatic fever    Hyperlipidemia    Hypertension    Obstructive sleep apnea    Osteoarthritis of knee    Right knee   Peripheral arterial disease (HCC)    status post aortobifemoral bypass grafting by Dr. Hart Rochester in 2005   Sleep apnea    Past Surgical History:  Procedure Laterality Date   ABDOMINAL AORTIC ANEURYSM REPAIR  2005   CARDIAC CATHETERIZATION  1992   followed by open heart coronary artery bypass grafting of 4 vessels post MI   CHOLECYSTECTOMY     CORONARY ARTERY BYPASS GRAFT     REPLACEMENT TOTAL KNEE     Right knee cartilage surgery in 1976    Social History:  reports that he quit smoking about 32 years ago. His smoking use included cigarettes. He has never been exposed to tobacco smoke. He has never used smokeless tobacco. He reports current alcohol use. He reports that he does not use drugs.   Allergies  Allergen Reactions   Statins Other (See Comments)    Intolerance     Family History  Problem Relation Age of Onset   Dementia Mother    Dementia Father       Prior to Admission medications   Medication Sig Start Date End Date Taking? Authorizing Provider  gabapentin (NEURONTIN) 100 MG capsule Take 1 capsule (100 mg total) by mouth daily. 08/20/22   Marcelyn Bruins, MD  amLODipine (NORVASC) 10 MG tablet  Take 1 tablet (10 mg total) by mouth daily. 06/09/18   Marykay Lex, MD  clobetasol (TEMOVATE) 0.05 % external solution APPLY TO AFFECTED AREA AS DIRECTED 09/23/21   Janalyn Harder, MD  clobetasol cream (TEMOVATE) 0.05 % Apply to skin 1-2 times daily not for face or skin folds 30 days per tube 11/03/21   Janalyn Harder, MD  Cyanocobalamin (VITAMIN B12) 1000 MCG TBCR 1 tablet    [provider]  doxepin (SINEQUAN) 25 MG capsule Take 25 mg by mouth at bedtime. 10/24/21   [provider]  ezetimibe (ZETIA) 10 MG tablet  Take 1 tablet (10 mg total) by mouth daily. 01/30/19 07/17/21  Runell Gess, MD  famotidine (PEPCID) 40 MG tablet Take 40 mg by mouth daily.    [provider]  hydrochlorothiazide (HYDRODIURIL) 25 MG tablet Take 25 mg by mouth daily. 08/05/22   [provider]  latanoprost (XALATAN) 0.005 % ophthalmic solution 1 drop at bedtime.    [provider]  latanoprost (XALATAN) 0.005 % ophthalmic solution 1 drop into affected eye in the evening    [provider]  metFORMIN (GLUCOPHAGE) 500 MG tablet Take 500 mg by mouth daily.    [provider]  olmesartan (BENICAR) 40 MG tablet Take 1 tablet (40 mg total) by mouth daily. 06/01/18   Marykay Lex, MD  simvastatin (ZOCOR) 20 MG tablet Take 1 tablet (20 mg total) by mouth daily. 06/01/18   Marykay Lex, MD  triamcinolone cream (KENALOG) 0.1 % Apply topically. 07/07/22   [provider]    Physical Exam: BP 130/69 (BP Location: Left Arm)   Pulse 84   Temp 99.1 F (37.3 C) (Oral)   Resp 16   Ht 5\' 7"  (1.702 m)   Wt 91.8 kg   SpO2 97%   BMI 31.68 kg/m   General: 77 y.o. year-old male well developed well nourished in no acute distress.  Alert and oriented x3. Cardiovascular: Regular rate and rhythm with no rubs or gallops.  No thyromegaly or JVD noted.  2+ pitting edema lower extremities bilaterally. Respiratory: Clear to auscultation with no wheezes or rales. Good inspiratory effort. Abdomen: Soft nontender nondistended with normal bowel sounds x4 quadrants. Muskuloskeletal: No cyanosis or clubbing noted bilaterally Neuro: CN II-XII intact, strength, sensation, reflexes Skin: No ulcerative lesions noted.  Diffuse rash worse on the back from pruritus. Psychiatry: Judgement and insight appear normal. Mood is appropriate for condition and setting          Labs on Admission:  Basic Metabolic Panel: Recent Labs  Lab 09/12/22 1311  NA 131*  K 3.2*  CL 93*  CO2 24  GLUCOSE 143*  BUN  24*  CREATININE 1.29*  CALCIUM 8.2*  MG 1.7   Liver Function Tests: Recent Labs  Lab 09/12/22 1311  AST 31  ALT 25  ALKPHOS 81  BILITOT 0.9  PROT 7.1  ALBUMIN 2.9*   No results for input(s): "LIPASE", "AMYLASE" in the last 168 hours. No results for input(s): "AMMONIA" in the last 168 hours. CBC: Recent Labs  Lab 09/12/22 1311  WBC 13.4*  NEUTROABS 11.1*  HGB 10.0*  HCT 30.0*  MCV 100.3*  PLT 244   Cardiac Enzymes: No results for input(s): "CKTOTAL", "CKMB", "CKMBINDEX", "TROPONINI" in the last 168 hours.  BNP (last 3 results) Recent Labs    09/12/22 1311  BNP 131.6*    ProBNP (last 3 results) No results for input(s): "PROBNP" in the last 8760 hours.  CBG: No results for input(s): "GLUCAP" in the last 168 hours.  Radiological Exams on Admission: DG Chest 2 View  Result Date: 09/12/2022 CLINICAL DATA:  Lower extremity swelling, pruritis EXAM: CHEST - 2 VIEW COMPARISON:  09/12/2022 FINDINGS: Frontal and lateral views of the chest demonstrates stable enlargement of the cardiac silhouette. There is minimal bibasilar subsegmental atelectasis or scarring. A prominent epicardial fat pad along the left heart border on prior CT likely accounts for the density seen on earlier x-ray. No acute airspace disease, effusion, or pneumothorax. No acute bony abnormalities. Postsurgical changes from median sternotomy. IMPRESSION: 1. Chronic bibasilar scarring or atelectasis. No acute airspace disease. Electronically Signed   By: Sharlet Salina M.D.   On: 09/12/2022 15:10   DG Chest Port 1 View  Result Date: 09/12/2022 CLINICAL DATA:  Shortness of breath EXAM: PORTABLE CHEST 1 VIEW COMPARISON:  11/03/2010 chest radiograph FINDINGS: Telemetry leads overlie the chest. Cardiomegaly and CABG changes again noted. Ill-defined opacity overlying the LATERAL LEFT lung base noted and may represent atelectasis, scarring or nodule. No definite pleural effusion or pneumothorax noted. No acute bony  abnormalities are identified. IMPRESSION: Ill-defined opacity overlying the LATERAL LEFT lung base which may represent atelectasis, scarring or nodule. Recommend PA and LATERAL chest radiograph clinically able Electronically Signed   By: Harmon Pier M.D.   On: 09/12/2022 13:57    EKG: I independently viewed the EKG done and my findings are as followed: Sinus rhythm rate of 75.  Nonspecific ST-T changes.  QTc 471.  Assessment/Plan Present on Admission: **None**  Principal Problem:   CHF exacerbation (HCC)  Acute CHF exacerbation Acute on chronic HFpEF Last 2D echo done on 05/19/2022 revealed LVEF 55% Follow 2D echo to rule out any acute cardiac structural abnormality Resume home cardiac medications Ongoing IV diuresing, IV Lasix 20 mg twice daily x 4 doses Start strict I's and O's and daily weight.  Elevated troponin, likely demand ischemia in the setting of acute CHF exacerbation Troponin peaked at 183 and down-trended No evidence of acute ischemia on twelve-lead EKG. Follow 2D echo to rule out new wall motion abnormalities. Resume home aspirin 81 mg daily, Zocor, and Zetia Monitor on telemetry.  Essential hypertension Resume home oral antihypertensives judiciously BPs have been soft Closely monitor vital signs while on IV diuresing  Hypokalemia Serum potassium 3.2 Repleted orally Repeat BMP in the morning and check magnesium level.  Anemia of chronic disease Hemoglobin 10.0, baseline hemoglobin 12 No reported bleeding Monitor H&H  Leukocytosis, rule out active infective process Follow UA and MRSA screening test Chest x-ray was nonacute Tmax 99.1, WBC 13.5. Monitor fever curve and WBC  Hyperlipidemia Resume home regimen.  Type 2 diabetes with hyperglycemia Obtain hemoglobin A1c Start insulin sliding scale Hold off home oral hypoglycemics  Diabetic polyneuropathy Resume home Neurontin  GERD Resume home H2 blocker   Obesity BMI 31 Recommend weight loss  outpatient with regular physical activity and healthy dieting.  OSA CPAP nightly  Generalized weakness PT OT assessment Fall precautions with assisted early mobilization   DVT prophylaxis: Subcu Lovenox daily  Code Status: Full code  Family Communication: None at bedside  Disposition Plan: Admitted to telemetry cardiac unit  Consults called: Cardiology consulted by EDP  Admission status: Observation status.   Status is: Observation    Darlin Drop MD [facility] Pager [phone]  If 7PM-7AM, please contact night-coverage [email or web address] Password [facility]  09/12/2022, 11:49 PM

## 2022-09-13 ENCOUNTER — Observation Stay (HOSPITAL_COMMUNITY): Payer: Medicare Other

## 2022-09-13 DIAGNOSIS — I7100 Dissection of unspecified site of aorta: Secondary | ICD-10-CM | POA: Diagnosis not present

## 2022-09-13 DIAGNOSIS — I130 Hypertensive heart and chronic kidney disease with heart failure and stage 1 through stage 4 chronic kidney disease, or unspecified chronic kidney disease: Secondary | ICD-10-CM | POA: Diagnosis present

## 2022-09-13 DIAGNOSIS — I110 Hypertensive heart disease with heart failure: Secondary | ICD-10-CM | POA: Diagnosis not present

## 2022-09-13 DIAGNOSIS — R7881 Bacteremia: Secondary | ICD-10-CM | POA: Diagnosis not present

## 2022-09-13 DIAGNOSIS — I10 Essential (primary) hypertension: Secondary | ICD-10-CM

## 2022-09-13 DIAGNOSIS — I2510 Atherosclerotic heart disease of native coronary artery without angina pectoris: Secondary | ICD-10-CM | POA: Diagnosis not present

## 2022-09-13 DIAGNOSIS — E440 Moderate protein-calorie malnutrition: Secondary | ICD-10-CM | POA: Diagnosis present

## 2022-09-13 DIAGNOSIS — I959 Hypotension, unspecified: Secondary | ICD-10-CM | POA: Diagnosis present

## 2022-09-13 DIAGNOSIS — G9341 Metabolic encephalopathy: Secondary | ICD-10-CM | POA: Diagnosis present

## 2022-09-13 DIAGNOSIS — I25810 Atherosclerosis of coronary artery bypass graft(s) without angina pectoris: Secondary | ICD-10-CM | POA: Diagnosis not present

## 2022-09-13 DIAGNOSIS — B9561 Methicillin susceptible Staphylococcus aureus infection as the cause of diseases classified elsewhere: Secondary | ICD-10-CM | POA: Diagnosis not present

## 2022-09-13 DIAGNOSIS — Y831 Surgical operation with implant of artificial internal device as the cause of abnormal reaction of the patient, or of later complication, without mention of misadventure at the time of the procedure: Secondary | ICD-10-CM | POA: Diagnosis present

## 2022-09-13 DIAGNOSIS — M25561 Pain in right knee: Secondary | ICD-10-CM | POA: Diagnosis not present

## 2022-09-13 DIAGNOSIS — M00061 Staphylococcal arthritis, right knee: Secondary | ICD-10-CM | POA: Diagnosis present

## 2022-09-13 DIAGNOSIS — I719 Aortic aneurysm of unspecified site, without rupture: Secondary | ICD-10-CM | POA: Diagnosis not present

## 2022-09-13 DIAGNOSIS — T8453XA Infection and inflammatory reaction due to internal right knee prosthesis, initial encounter: Secondary | ICD-10-CM | POA: Diagnosis present

## 2022-09-13 DIAGNOSIS — F05 Delirium due to known physiological condition: Secondary | ICD-10-CM | POA: Diagnosis not present

## 2022-09-13 DIAGNOSIS — I71011 Dissection of aortic arch: Secondary | ICD-10-CM | POA: Diagnosis not present

## 2022-09-13 DIAGNOSIS — G4733 Obstructive sleep apnea (adult) (pediatric): Secondary | ICD-10-CM | POA: Diagnosis not present

## 2022-09-13 DIAGNOSIS — I330 Acute and subacute infective endocarditis: Secondary | ICD-10-CM | POA: Diagnosis present

## 2022-09-13 DIAGNOSIS — E782 Mixed hyperlipidemia: Secondary | ICD-10-CM | POA: Diagnosis not present

## 2022-09-13 DIAGNOSIS — Z515 Encounter for palliative care: Secondary | ICD-10-CM | POA: Diagnosis not present

## 2022-09-13 DIAGNOSIS — E871 Hypo-osmolality and hyponatremia: Secondary | ICD-10-CM | POA: Diagnosis not present

## 2022-09-13 DIAGNOSIS — I509 Heart failure, unspecified: Secondary | ICD-10-CM | POA: Diagnosis present

## 2022-09-13 DIAGNOSIS — E1122 Type 2 diabetes mellitus with diabetic chronic kidney disease: Secondary | ICD-10-CM | POA: Diagnosis present

## 2022-09-13 DIAGNOSIS — D638 Anemia in other chronic diseases classified elsewhere: Secondary | ICD-10-CM | POA: Diagnosis present

## 2022-09-13 DIAGNOSIS — Z66 Do not resuscitate: Secondary | ICD-10-CM | POA: Diagnosis not present

## 2022-09-13 DIAGNOSIS — I519 Heart disease, unspecified: Secondary | ICD-10-CM | POA: Diagnosis not present

## 2022-09-13 DIAGNOSIS — J90 Pleural effusion, not elsewhere classified: Secondary | ICD-10-CM | POA: Diagnosis not present

## 2022-09-13 DIAGNOSIS — I2489 Other forms of acute ischemic heart disease: Secondary | ICD-10-CM | POA: Diagnosis present

## 2022-09-13 DIAGNOSIS — M009 Pyogenic arthritis, unspecified: Secondary | ICD-10-CM | POA: Diagnosis not present

## 2022-09-13 DIAGNOSIS — I71019 Dissection of thoracic aorta, unspecified: Secondary | ICD-10-CM | POA: Diagnosis not present

## 2022-09-13 DIAGNOSIS — I7102 Dissection of abdominal aorta: Secondary | ICD-10-CM | POA: Diagnosis present

## 2022-09-13 DIAGNOSIS — I76 Septic arterial embolism: Secondary | ICD-10-CM | POA: Diagnosis present

## 2022-09-13 DIAGNOSIS — I129 Hypertensive chronic kidney disease with stage 1 through stage 4 chronic kidney disease, or unspecified chronic kidney disease: Secondary | ICD-10-CM | POA: Diagnosis not present

## 2022-09-13 DIAGNOSIS — Z7189 Other specified counseling: Secondary | ICD-10-CM | POA: Diagnosis not present

## 2022-09-13 DIAGNOSIS — R509 Fever, unspecified: Secondary | ICD-10-CM | POA: Diagnosis not present

## 2022-09-13 DIAGNOSIS — I5033 Acute on chronic diastolic (congestive) heart failure: Secondary | ICD-10-CM

## 2022-09-13 DIAGNOSIS — N1831 Chronic kidney disease, stage 3a: Secondary | ICD-10-CM | POA: Diagnosis present

## 2022-09-13 DIAGNOSIS — I714 Abdominal aortic aneurysm, without rupture, unspecified: Secondary | ICD-10-CM | POA: Diagnosis not present

## 2022-09-13 DIAGNOSIS — E1142 Type 2 diabetes mellitus with diabetic polyneuropathy: Secondary | ICD-10-CM | POA: Diagnosis present

## 2022-09-13 DIAGNOSIS — Z96651 Presence of right artificial knee joint: Secondary | ICD-10-CM | POA: Diagnosis not present

## 2022-09-13 DIAGNOSIS — N179 Acute kidney failure, unspecified: Secondary | ICD-10-CM | POA: Diagnosis not present

## 2022-09-13 DIAGNOSIS — I7101 Dissection of ascending aorta: Secondary | ICD-10-CM | POA: Diagnosis not present

## 2022-09-13 DIAGNOSIS — E669 Obesity, unspecified: Secondary | ICD-10-CM | POA: Diagnosis present

## 2022-09-13 DIAGNOSIS — A4101 Sepsis due to Methicillin susceptible Staphylococcus aureus: Secondary | ICD-10-CM | POA: Diagnosis present

## 2022-09-13 DIAGNOSIS — Z1152 Encounter for screening for COVID-19: Secondary | ICD-10-CM | POA: Diagnosis not present

## 2022-09-13 DIAGNOSIS — I739 Peripheral vascular disease, unspecified: Secondary | ICD-10-CM | POA: Diagnosis not present

## 2022-09-13 DIAGNOSIS — R7989 Other specified abnormal findings of blood chemistry: Secondary | ICD-10-CM | POA: Diagnosis not present

## 2022-09-13 DIAGNOSIS — R059 Cough, unspecified: Secondary | ICD-10-CM | POA: Diagnosis not present

## 2022-09-13 DIAGNOSIS — D72829 Elevated white blood cell count, unspecified: Secondary | ICD-10-CM | POA: Diagnosis present

## 2022-09-13 DIAGNOSIS — I25112 Atherosclerotic heart disease of native coronary artery with refractory angina pectoris: Secondary | ICD-10-CM | POA: Diagnosis not present

## 2022-09-13 DIAGNOSIS — I358 Other nonrheumatic aortic valve disorders: Secondary | ICD-10-CM | POA: Diagnosis not present

## 2022-09-13 DIAGNOSIS — G9340 Encephalopathy, unspecified: Secondary | ICD-10-CM | POA: Diagnosis not present

## 2022-09-13 DIAGNOSIS — I71012 Dissection of descending thoracic aorta: Secondary | ICD-10-CM | POA: Diagnosis present

## 2022-09-13 LAB — URINALYSIS, W/ REFLEX TO CULTURE (INFECTION SUSPECTED)
Bacteria, UA: NONE SEEN
Bilirubin Urine: NEGATIVE
Glucose, UA: NEGATIVE mg/dL
Hgb urine dipstick: NEGATIVE
Ketones, ur: 5 mg/dL — AB
Leukocytes,Ua: NEGATIVE
Nitrite: NEGATIVE
Protein, ur: 30 mg/dL — AB
Specific Gravity, Urine: 1.014 (ref 1.005–1.030)
pH: 5 (ref 5.0–8.0)

## 2022-09-13 LAB — GLUCOSE, CAPILLARY
Glucose-Capillary: 130 mg/dL — ABNORMAL HIGH (ref 70–99)
Glucose-Capillary: 142 mg/dL — ABNORMAL HIGH (ref 70–99)
Glucose-Capillary: 163 mg/dL — ABNORMAL HIGH (ref 70–99)
Glucose-Capillary: 165 mg/dL — ABNORMAL HIGH (ref 70–99)
Glucose-Capillary: 172 mg/dL — ABNORMAL HIGH (ref 70–99)
Glucose-Capillary: 178 mg/dL — ABNORMAL HIGH (ref 70–99)

## 2022-09-13 LAB — BASIC METABOLIC PANEL
Anion gap: 11 (ref 5–15)
BUN: 19 mg/dL (ref 8–23)
CO2: 26 mmol/L (ref 22–32)
Calcium: 8.1 mg/dL — ABNORMAL LOW (ref 8.9–10.3)
Chloride: 94 mmol/L — ABNORMAL LOW (ref 98–111)
Creatinine, Ser: 1.28 mg/dL — ABNORMAL HIGH (ref 0.61–1.24)
GFR, Estimated: 57 mL/min — ABNORMAL LOW (ref >60)
Glucose, Bld: 153 mg/dL — ABNORMAL HIGH (ref 70–99)
Potassium: 3.6 mmol/L (ref 3.5–5.1)
Sodium: 131 mmol/L — ABNORMAL LOW (ref 135–145)

## 2022-09-13 LAB — CBC
HCT: 28.7 % — ABNORMAL LOW (ref 39.0–52.0)
Hemoglobin: 9.6 g/dL — ABNORMAL LOW (ref 13.0–17.0)
MCH: 33.2 pg (ref 26.0–34.0)
MCHC: 33.4 g/dL (ref 30.0–36.0)
MCV: 99.3 fL (ref 80.0–100.0)
Platelets: 242 10*3/uL (ref 150–400)
RBC: 2.89 MIL/uL — ABNORMAL LOW (ref 4.22–5.81)
RDW: 13 % (ref 11.5–15.5)
WBC: 13.3 10*3/uL — ABNORMAL HIGH (ref 4.0–10.5)
nRBC: 0 % (ref 0.0–0.2)

## 2022-09-13 LAB — MRSA NEXT GEN BY PCR, NASAL: MRSA by PCR Next Gen: NOT DETECTED

## 2022-09-13 LAB — PHOSPHORUS: Phosphorus: 2.7 mg/dL (ref 2.5–4.6)

## 2022-09-13 LAB — MAGNESIUM: Magnesium: 1.6 mg/dL — ABNORMAL LOW (ref 1.7–2.4)

## 2022-09-13 LAB — HEMOGLOBIN A1C
Hgb A1c MFr Bld: 5.8 % — ABNORMAL HIGH (ref 4.8–5.6)
Mean Plasma Glucose: 119.76 mg/dL

## 2022-09-13 MED ORDER — DIPHENHYDRAMINE HCL 25 MG PO CAPS
25.0000 mg | ORAL_CAPSULE | Freq: Four times a day (QID) | ORAL | Status: DC | PRN
  Administered 2022-09-13 – 2022-09-19 (×2): 25 mg via ORAL
  Filled 2022-09-13 (×2): qty 1

## 2022-09-13 MED ORDER — ALUM & MAG HYDROXIDE-SIMETH 200-200-20 MG/5ML PO SUSP
30.0000 mL | ORAL | Status: DC | PRN
  Administered 2022-09-13 – 2022-09-17 (×2): 30 mL via ORAL
  Filled 2022-09-13 (×2): qty 30

## 2022-09-13 MED ORDER — FUROSEMIDE 10 MG/ML IJ SOLN
60.0000 mg | Freq: Two times a day (BID) | INTRAMUSCULAR | Status: DC
  Administered 2022-09-13 – 2022-09-14 (×2): 60 mg via INTRAVENOUS
  Filled 2022-09-13 (×2): qty 6

## 2022-09-13 MED ORDER — POTASSIUM CHLORIDE CRYS ER 20 MEQ PO TBCR
40.0000 meq | EXTENDED_RELEASE_TABLET | Freq: Once | ORAL | Status: AC
  Administered 2022-09-13: 40 meq via ORAL
  Filled 2022-09-13: qty 2

## 2022-09-13 MED ORDER — EMPAGLIFLOZIN 10 MG PO TABS
10.0000 mg | ORAL_TABLET | Freq: Every day | ORAL | Status: DC
  Administered 2022-09-13 – 2022-09-15 (×3): 10 mg via ORAL
  Filled 2022-09-13 (×3): qty 1

## 2022-09-13 MED ORDER — MELATONIN 5 MG PO TABS
5.0000 mg | ORAL_TABLET | Freq: Every evening | ORAL | Status: DC | PRN
  Administered 2022-09-13 – 2022-09-23 (×10): 5 mg via ORAL
  Filled 2022-09-13 (×10): qty 1

## 2022-09-13 MED ORDER — TRAMADOL HCL 50 MG PO TABS
50.0000 mg | ORAL_TABLET | Freq: Four times a day (QID) | ORAL | Status: DC | PRN
  Administered 2022-09-13 – 2022-09-23 (×14): 50 mg via ORAL
  Filled 2022-09-13 (×14): qty 1

## 2022-09-13 MED ORDER — ACETAMINOPHEN 325 MG PO TABS
650.0000 mg | ORAL_TABLET | Freq: Four times a day (QID) | ORAL | Status: DC | PRN
  Administered 2022-09-13 – 2022-09-23 (×10): 650 mg via ORAL
  Filled 2022-09-13 (×10): qty 2

## 2022-09-13 MED ORDER — POLYETHYLENE GLYCOL 3350 17 G PO PACK
17.0000 g | PACK | Freq: Every day | ORAL | Status: DC | PRN
  Administered 2022-09-13 – 2022-09-24 (×2): 17 g via ORAL
  Filled 2022-09-13 (×2): qty 1

## 2022-09-13 MED ORDER — SPIRONOLACTONE 12.5 MG HALF TABLET
12.5000 mg | ORAL_TABLET | Freq: Every day | ORAL | Status: DC
  Administered 2022-09-13 – 2022-09-15 (×3): 12.5 mg via ORAL
  Filled 2022-09-13 (×3): qty 1

## 2022-09-13 MED ORDER — LOSARTAN POTASSIUM 25 MG PO TABS
25.0000 mg | ORAL_TABLET | Freq: Every day | ORAL | Status: DC
  Administered 2022-09-14 – 2022-09-15 (×2): 25 mg via ORAL
  Filled 2022-09-13 (×2): qty 1

## 2022-09-13 MED ORDER — ASPIRIN 81 MG PO TBEC
81.0000 mg | DELAYED_RELEASE_TABLET | Freq: Every day | ORAL | Status: DC
  Administered 2022-09-13 – 2022-09-24 (×12): 81 mg via ORAL
  Filled 2022-09-13 (×12): qty 1

## 2022-09-13 MED ORDER — PROCHLORPERAZINE EDISYLATE 10 MG/2ML IJ SOLN
5.0000 mg | Freq: Four times a day (QID) | INTRAMUSCULAR | Status: DC | PRN
  Administered 2022-09-13: 5 mg via INTRAVENOUS
  Filled 2022-09-13: qty 2

## 2022-09-13 MED ORDER — FUROSEMIDE 10 MG/ML IJ SOLN
20.0000 mg | Freq: Two times a day (BID) | INTRAMUSCULAR | Status: DC
  Administered 2022-09-13: 20 mg via INTRAVENOUS
  Filled 2022-09-13: qty 2

## 2022-09-13 NOTE — Assessment & Plan Note (Addendum)
Continue with ezetimibe and simvastatin.

## 2022-09-13 NOTE — Progress Notes (Signed)
MEWS Progress Note  Patient Details Name: Johnny [name] MRN: [id number] DOB: 12-29-1945 Today's Date: 09/13/2022   MEWS Flowsheet Documentation:  Assess: MEWS Score Temp: (!) 100.6 F (38.1 C) BP: 139/67 MAP (mmHg): 88 Pulse Rate: 74 ECG Heart Rate: 74 Resp: (!) 24 Level of Consciousness: Alert SpO2: 98 % O2 Device: Room Air Assess: MEWS Score MEWS Temp: 1 MEWS Systolic: 0 MEWS Pulse: 0 MEWS RR: 1 MEWS LOC: 0 MEWS Score: 2 MEWS Score Color: Yellow Assess: SIRS CRITERIA SIRS Temperature : 0 SIRS Respirations : 1 SIRS Pulse: 0 SIRS WBC: 1 SIRS Score Sum : 2 SIRS Temperature : 0 SIRS Pulse: 0 SIRS Respirations : 1 SIRS WBC: 1 SIRS Score Sum : 2 Assess: if the MEWS score is Yellow or Red Were vital signs taken at a resting state?: Yes Focused Assessment: Change from prior assessment (see assessment flowsheet) Does the patient meet 2 or more of the SIRS criteria?: Yes Does the patient have a confirmed or suspected source of infection?: No MEWS guidelines implemented : Yes, yellow Treat MEWS Interventions: Considered administering scheduled or prn medications/treatments as ordered Take Vital Signs Increase Vital Sign Frequency : Yellow: Q2hr x1, continue Q4hrs until patient remains green for 12hrs Escalate MEWS: Escalate: Yellow: Discuss with charge nurse and consider notifying provider and/or RRT Notify: Charge Nurse/RN Name of Charge Nurse/RN Notified: Porfirio Oar, RN Provider Notification Provider Name/Title: Ella Jubilee, MD Date Provider Notified: 09/13/22 Time Provider Notified: [phone] Method of Notification: Page Notification Reason: Change in status Test performed and critical result: Troponin 183 Date Critical Result Received: 09/12/22 Time Critical Result Received: 1311 Provider response: See new orders Date of Provider Response: 09/13/22 Time of Provider Response: 0719      Alonza Bogus 09/13/2022, 8:05 AM

## 2022-09-13 NOTE — Evaluation (Signed)
Physical Therapy Evaluation Patient Details Name: DONTEL HENDRIKS MRN: [id number] DOB: 16-Jan-1946 Today's Date: 09/13/2022  History of Present Illness  Pt is a 77 y/o M presenting to ED on with hand and BLE edema, weakness, dyspnea with exertion,CXR revealing lower L atelectasiss, no pleural effusion or edema. Admitted for CHF exacerbation. PMH includes MI, PAD, aortic stenosis, CAD s/p CABG, HTN, AAA, hyperlipidemia, chronic pruritis/dermal hypersensitivity reaction, OSA.  Clinical Impression  Pt presents with admitting diagnosis above. Pt was able to ambulate in hallway independently. Pt presents at or near baseline mobility. Pt has no further acute PT needs and will be signing off. Re consult PT if mobility status changes.        Recommendations for follow up therapy are one component of a multi-disciplinary discharge planning process, led by the attending physician.  Recommendations may be updated based on patient status, additional functional criteria and insurance authorization.  Follow Up Recommendations       Assistance Recommended at Discharge PRN  Patient can return home with the following  Assistance with cooking/housework;Assist for transportation;Help with stairs or ramp for entrance    Equipment Recommendations None recommended by PT  Recommendations for Other Services       Functional Status Assessment Patient has had a recent decline in their functional status and demonstrates the ability to make significant improvements in function in a reasonable and predictable amount of time.     Precautions / Restrictions Precautions Precautions: Fall Restrictions Weight Bearing Restrictions: No      Mobility  Bed Mobility Overal bed mobility: Modified Independent                  Transfers Overall transfer level: Independent Equipment used: None Transfers: Sit to/from Stand Sit to Stand: Independent                Ambulation/Gait Ambulation/Gait  assistance: Independent Gait Distance (Feet): 250 Feet Assistive device: None Gait Pattern/deviations: WFL(Within Functional Limits), Trunk flexed Gait velocity: Decreased     General Gait Details: No LOB noted.  Stairs            Wheelchair Mobility    Modified Rankin (Stroke Patients Only)       Balance Overall balance assessment: Mild deficits observed, not formally tested                                           Pertinent Vitals/Pain Pain Assessment Pain Assessment: 0-10 Pain Score: 3  Pain Location: Low back and bottom Pain Descriptors / Indicators: Aching, Constant, Discomfort, Grimacing Pain Intervention(s): Monitored during session, Repositioned    Home Living Family/patient expects to be discharged to:: Private residence Living Arrangements: Spouse/significant other (spouse has dementia  ) Available Help at Discharge: Family;Available PRN/intermittently Type of Home: House Home Access: Stairs to enter   Entrance Stairs-Number of Steps: 1   Home Layout: One level Home Equipment: Grab bars - tub/shower;Hand held shower head      Prior Function Prior Level of Function : Independent/Modified Independent;Driving;History of Falls (last six months) (Pt reports tripping in dark room.  )             Mobility Comments: no AD use ADLs Comments: ind with ADLs     Hand Dominance   Dominant Hand: Right    Extremity/Trunk Assessment   Upper Extremity Assessment Upper Extremity Assessment: Overall WFL for tasks  assessed    Lower Extremity Assessment Lower Extremity Assessment: Overall WFL for tasks assessed    Cervical / Trunk Assessment Cervical / Trunk Assessment: Normal  Communication   Communication: No difficulties  Cognition Arousal/Alertness: Awake/alert Behavior During Therapy: WFL for tasks assessed/performed Overall Cognitive Status: Within Functional Limits for tasks assessed                                  General Comments: aware of reason for being in the hospital, mild decreased awareness of deficits        General Comments General comments (skin integrity, edema, etc.): VSS on RA    Exercises     Assessment/Plan    PT Assessment Patient does not need any further PT services  PT Problem List         PT Treatment Interventions      PT Goals (Current goals can be found in the Care Plan section)       Frequency       Co-evaluation               AM-PAC PT "6 Clicks" Mobility  Outcome Measure Help needed turning from your back to your side while in a flat bed without using bedrails?: None Help needed moving from lying on your back to sitting on the side of a flat bed without using bedrails?: None Help needed moving to and from a bed to a chair (including a wheelchair)?: None Help needed standing up from a chair using your arms (e.g., wheelchair or bedside chair)?: None Help needed to walk in hospital room?: None Help needed climbing 3-5 steps with a railing? : A Little 6 Click Score: 23    End of Session Equipment Utilized During Treatment: Gait belt Activity Tolerance: Patient tolerated treatment well Patient left: in bed;with call bell/phone within reach Nurse Communication: Mobility status PT Visit Diagnosis: Other abnormalities of gait and mobility (R26.89)    Time: 4782-9562 PT Time Calculation (min) (ACUTE ONLY): 20 min   Charges:   PT Evaluation $PT Eval Low Complexity: 1 Low PT Treatments $Gait Training: 8-22 mins        Shela Nevin, PT, DPT Acute Rehab Services [phone]   Gladys Damme 09/13/2022, 1:50 PM

## 2022-09-13 NOTE — Assessment & Plan Note (Signed)
Patient had decreased energy for 24 hrs prior to admission.  Old records personally reviewed, patient had CABG in 1992 and non ischemic Myoview in 2012.  He has not had frank angina.   Continue aspirin and increase dose of statin.  Patient may need ischemic work up.

## 2022-09-13 NOTE — Assessment & Plan Note (Addendum)
Sepsis not present on admission, severe in intensity with AKI, end organ damage.   Positive blood culture to MSSA.  Right knee synovial fluid with 96,500 wbc with 88 PMN. Fluid positive for staphylococcus aureus.   Wbc is 18.7,patient has bee afebrile.   Continue antibiotic therapy with IV cefazolin.  I&D right knee today per orthopedics.  Pending TEE to rule out endocarditis.  Source probably skin, severe pruritus and scratching.

## 2022-09-13 NOTE — Progress Notes (Signed)
TRH night cross cover note:   I was notified by RN of the patient's elevated temperature 38.1 C.  While elevated, does not meet quantitative threshold of 38.3 for SIRS criteria.  Other vital signs appear stable, including heart rates in the 70s to 80s, most recent blood pressure 122/61, and respiratory rate overnight 15-20, while maintaining oxygen saturations in the mid to high 90s on room air.  Per my brief chart review, his white blood cell count this morning is noted to be 13.3, which is trending down from yesterday's value of 13.4.  Consequently, SIRS criteria not currently met for sepsis.  Urinalysis performed overnight showed no white blood cells.  Given that aforementioned most recent temperature 38.1 appears to represent a 24-hour temperature max, will obtain an updated chest x-ray to further assess.    Newton Pigg, DO Hospitalist

## 2022-09-13 NOTE — Assessment & Plan Note (Signed)
Cpap

## 2022-09-13 NOTE — Assessment & Plan Note (Signed)
Calculated BMI is 31.6  

## 2022-09-13 NOTE — Progress Notes (Addendum)
Progress Note   Patient: Johnny [name]:[id number] DOB: 09-06-1945 DOA: 09/12/2022     0 DOS: the patient was seen and examined on 09/13/2022   Brief hospital course: Johnny [name] was admitted to the hospital with the working diagnosis of heart failure exacerbation.   77 yo male with the past medical history of coronary artery disease, sp CABG, abdominal aortic aneurysm, hypertension, obesity and dyslipidemia who presented with dyspnea. Reported progressive lower and upper extremity edema, along with dyspnea. On his initial physical examination his blood pressure was 130/69, HR 64m RR 16 and 02 saturation 97%, heart with S1 and S2 present and rhythmic, lungs with no wheezing or rales, abdomen with no distention, positive lower extremity edema.   Na 131, K 3,2 Cl 93, bicarbonate 24, glucose 143, bun 24, cr 1.29  Mg 1,7  High sensitive troponin 183 and 150  Wbc 13,4 hgb 10.0 plt 244  Sars covid 19 negative   Chest radiograph with cardiomegaly, bilateral hilar vascular congestion, bilateral atelectasis at bases with no effusions. Sternotomy wires in place.   EKG 75 bpm, normal axis, normal intervals, sinus rhythm with no significant ST segment, negative T wave lead V1 and V2. (Old changes).    Assessment and Plan: * Acute on chronic diastolic CHF (congestive heart failure) (HCC) Echocardiogram with preserved LV systolic function, EF 55%, no LVH, RV mild reduction in systolic function, RVSP 30,5 mmHg. Mild dilatation of LA, mild TR, mild aortic stenosis.   Urine output is 520 ml Systolic blood pressure 116 to 123 mmHg.   Plan to continue diuresis with furosemide, increased dose to 60 mg IV q 12 hrs.  Will add SGLT 2 inh and spironolactone to augment diuresis.  Discontinue amlodipine and will add losartan.  Will hold on repeat echocardiogram, last study is from 04/2022.   Essential hypertension Plan to discontinue amlodipine and will add losartan for blood pressure control.  Continue  diuresis with loop diuretic, SGLT 2 inh and MRA.   Coronary artery disease No chest pain, continue with aspirin and statin therapy.   Hyperlipidemia Continue with ezetimibe and simvastatin.   Peripheral arterial disease (HCC) Continue with aspirin and statin therapy.   Obstructive sleep apnea C pap.   Class 1 obesity Calculated BMI is 31.6  Leukocytosis Reactive leukocytosis, t max 100.6  Chest radiograph with no pneumonia Add incentive spirometer.         Subjective: Patient with no chest pain, dyspnea and edema are improving but not back to baseline   Physical Exam: Vitals:   09/13/22 0616 09/13/22 0704 09/13/22 0820 09/13/22 1202  BP: 122/61 139/67 116/65 (!) 123/58  Pulse: 76 74 79 71  Resp: 20 (!) 24 20 (!) 23  Temp: (!) 100.6 F (38.1 C)  98.2 F (36.8 C) 98 F (36.7 C)  TempSrc: Oral  Oral Oral  SpO2: 97% 98% 97% 97%  Weight: 91.8 kg     Height:       Neurology awake and alert ENT with mild pallor Cardiovascular with S1 and S2 present and regular with positive systolic murmur at the base Respiratory with no wheezing or rhonchi Abdomen with no distention  Positive lower extremity edema Data Reviewed:    Family Communication: I spoke with patient's son at the bedside, we talked in detail about patient's condition, plan of care and prognosis and all questions were addressed.   Disposition: Status is: Observation The patient will require care spanning > 2 midnights and should be moved to inpatient  because: heart failure   Planned Discharge Destination: Home  Author: Coralie Keens, MD 09/13/2022 2:14 PM  For on call review [email or web address].

## 2022-09-13 NOTE — Plan of Care (Signed)

## 2022-09-13 NOTE — Evaluation (Signed)
Occupational Therapy Evaluation Patient Details Name: Johnny [name] MRN: [id number] DOB: 21-Jan-1946 Today's Date: 09/13/2022   History of Present Illness Pt is a 77 y/o M presenting to ED on with hand and BLE edema, weakness, dyspnea with exertion,CXR revealing lower L atelectasiss, no pleural effusion or edema. Admitted for CHF exacerbation. PMH includes MI, PAD, aortic stenosis, CAD s/p CABG, HTN, AAA, hyperlipidemia, chronic pruritis/dermal hypersensitivity reaction, OSA.   Clinical Impression   Pt reports independence at baseline with ADLs and functional mobility, lives with/cares for spouse who has dementia. Reports he has had a fall at home and gets fatigued with bathing. Pt currently needing supervision - min guard A for ADLs, mod I for bed mobility,and min guard for transfers without AD. Pt presenting with impairments listed below, will follow acutely. Anticipate no OT follow up needs at d/c.      Recommendations for follow up therapy are one component of a multi-disciplinary discharge planning process, led by the attending physician.  Recommendations may be updated based on patient status, additional functional criteria and insurance authorization.   Assistance Recommended at Discharge Set up Supervision/Assistance  Patient can return home with the following A little help with walking and/or transfers;A little help with bathing/dressing/bathroom;Assistance with cooking/housework;Assist for transportation;Help with stairs or ramp for entrance    Functional Status Assessment  Patient has had a recent decline in their functional status and demonstrates the ability to make significant improvements in function in a reasonable and predictable amount of time.  Equipment Recommendations  Tub/shower seat    Recommendations for Other Services PT consult     Precautions / Restrictions Precautions Precautions: Fall Restrictions Weight Bearing Restrictions: No (Simultaneous filing. User  may not have seen previous data.)      Mobility Bed Mobility Overal bed mobility: Modified Independent                  Transfers Overall transfer level: Needs assistance Equipment used: None Transfers: Sit to/from Stand Sit to Stand: Min guard                  Balance Overall balance assessment: Needs assistance Sitting-balance support: Feet supported Sitting balance-Leahy Scale: Good Sitting balance - Comments: reaches outside BOS without LOB   Standing balance support: During functional activity Standing balance-Leahy Scale: Good                             ADL either performed or assessed with clinical judgement   ADL Overall ADL's : Needs assistance/impaired Eating/Feeding: Sitting;Supervision/ safety   Grooming: Supervision/safety;Standing;Oral care   Upper Body Bathing: Supervision/ safety;Sitting   Lower Body Bathing: Supervison/ safety;Sitting/lateral leans;Sit to/from stand   Upper Body Dressing : Min guard;Sitting   Lower Body Dressing: Min guard;Sitting/lateral leans   Toilet Transfer: Min guard   Toileting- Clothing Manipulation and Hygiene: Min guard;Sitting/lateral lean       Functional mobility during ADLs: Min guard       Vision   Vision Assessment?: No apparent visual deficits     Development worker, international aid Tested?: No   Praxis Praxis Praxis tested?: Not tested    Pertinent Vitals/Pain Pain Assessment Faces Pain Scale: Hurts little more     Hand Dominance Right   Extremity/Trunk Assessment Upper Extremity Assessment Upper Extremity Assessment: Overall WFL for tasks assessed   Lower Extremity Assessment Lower Extremity Assessment: Defer to PT evaluation   Cervical / Trunk Assessment Cervical / Trunk Assessment: Normal  Communication Communication Communication: No difficulties (Simultaneous filing. User may not have seen previous data.)   Cognition                                        General Comments: aware of reason for being in the hospital, mild decreased awareness of deficits     General Comments  VSS on RA    Exercises     Shoulder Instructions      Home Living Family/patient expects to be discharged to:: Private residence (Simultaneous filing. User may not have seen previous data.) Living Arrangements: Spouse/significant other (spouse has dementia  Simultaneous filing. User may not have seen previous data.) Available Help at Discharge: Family;Available PRN/intermittently (Simultaneous filing. User may not have seen previous data.) Type of Home: House (Simultaneous filing. User may not have seen previous data.) Home Access: Stairs to enter (Simultaneous filing. User may not have seen previous data.) Entrance Stairs-Number of Steps: 1 (Simultaneous filing. User may not have seen previous data.)   Home Layout: One level (Simultaneous filing. User may not have seen previous data.)     Bathroom Shower/Tub: Tub/shower unit;Walk-in shower (Simultaneous filing. User may not have seen previous data.)   Bathroom Toilet: Handicapped height (Simultaneous filing. User may not have seen previous data.)     Home Equipment: Grab bars - tub/shower;Hand held shower head (Simultaneous filing. User may not have seen previous data.)          Prior Functioning/Environment Prior Level of Function : Independent/Modified Independent;Driving;History of Falls (last six months) (Pt reports tripping in dark room.  Simultaneous filing. User may not have seen previous data.)             Mobility Comments: no AD use (Simultaneous filing. User may not have seen previous data.) ADLs Comments: ind with ADLs (Simultaneous filing. User may not have seen previous data.)        OT Problem List: Decreased strength;Decreased range of motion;Decreased activity tolerance;Impaired balance (sitting and/or standing)      OT Treatment/Interventions: Self-care/ADL  training;Therapeutic exercise;Energy conservation;DME and/or AE instruction;Therapeutic activities;Patient/family education;Balance training    OT Goals(Current goals can be found in the care plan section) Acute Rehab OT Goals Patient Stated Goal: none stated OT Goal Formulation: With patient Time For Goal Achievement: 09/27/22 Potential to Achieve Goals: Good  OT Frequency: Min 1X/week    Co-evaluation              AM-PAC OT "6 Clicks" Daily Activity     Outcome Measure Help from another person eating meals?: None Help from another person taking care of personal grooming?: A Little Help from another person toileting, which includes using toliet, bedpan, or urinal?: A Little Help from another person bathing (including washing, rinsing, drying)?: A Little Help from another person to put on and taking off regular upper body clothing?: A Little Help from another person to put on and taking off regular lower body clothing?: A Little 6 Click Score: 19   End of Session Equipment Utilized During Treatment: Gait belt Nurse Communication: Mobility status  Activity Tolerance: Patient tolerated treatment well Patient left: with call bell/phone within reach;in chair;with chair alarm set  OT Visit Diagnosis: Unsteadiness on feet (R26.81);Other abnormalities of gait and mobility (R26.89);Muscle weakness (generalized) (M62.81);History of falling (Z91.81)                Time: 1610-9604 OT Time Calculation (  min): 23 min Charges:  OT General Charges $OT Visit: 1 Visit OT Evaluation $OT Eval Low Complexity: 1 Low OT Treatments $Self Care/Home Management : 8-22 mins  Carver Fila, OTD, OTR/L SecureChat Preferred Acute Rehab [phone]   Carver Fila Koonce 09/13/2022, 9:53 AM

## 2022-09-13 NOTE — Assessment & Plan Note (Signed)
Plan to discontinue amlodipine and will add losartan for blood pressure control.  Continue diuresis with loop diuretic, SGLT 2 inh and MRA.

## 2022-09-13 NOTE — Progress Notes (Signed)
Pt c/o intermittent, sharp, shooting pain 7/10 all across his chest and around to his left back/flank.  No changes noted on EKG.  VSS except temp 100.6 for which tylenol was given.  Dr. Ella Jubilee notified.  See new orders.  Will continue to monitor.  Alonza Bogus 0745: Pt is lying supine in bed talking to his son on the phone; reports relief from pain.  Report given to Naa, Charity fundraiser.  Alonza Bogus

## 2022-09-13 NOTE — Hospital Course (Addendum)
Mr. Johnny [name] was admitted to the hospital with the working diagnosis of heart failure exacerbation.   77 yo male with the past medical history of coronary artery disease, sp CABG, abdominal aortic aneurysm, hypertension, obesity and dyslipidemia who presented with dyspnea. Reported progressive lower and upper extremity edema, along with dyspnea. On his initial physical examination his blood pressure was 130/69, HR 82m RR 16 and 02 saturation 97%, heart with S1 and S2 present and rhythmic, lungs with no wheezing or rales, abdomen with no distention, positive lower extremity edema.   Na 131, K 3,2 Cl 93, bicarbonate 24, glucose 143, bun 24, cr 1.29  Mg 1,7  High sensitive troponin 183 and 150  Wbc 13,4 hgb 10.0 plt 244  Sars covid 19 negative   Chest radiograph with cardiomegaly, bilateral hilar vascular congestion, bilateral atelectasis at bases with no effusions. Sternotomy wires in place.   EKG 75 bpm, normal axis, normal intervals, sinus rhythm with no significant ST segment, negative T wave lead V1 and V2. (Old changes).   05/ 20 patient has responded well to diuresis. Possible discharge tomorrow.  Check limited echocardiogram for elevated troponin.  05/21 echocardiogram with mild reduction in LV systolic function and positive new wall motion abnormalities.  05/22 positive blood culture for MSSA, ID and orthopedics have been formally consulted.  05/23 right knee arthrocentesis consistent with septic joint. Pending TEE.  05/24 patient has been confused last night, positive agitation and not able to sleep. Tele sitter at the bedside.  Plan for right knee I&D today.

## 2022-09-13 NOTE — Assessment & Plan Note (Addendum)
Limited echocardiogram with LV systolic function mildly reduced to 40 to 45%, hypokinesis of the basal-mid antero septal wall, inferoseptal wall and anterior wall.   Volume status has improved.   3 Kg weight loss since admission.  Systolic blood pressure 105 to 117 mmHg.   Continue medical therapy with SGLT 2 inh, spironolactone and losartan.  Continue holding loop diuretic for now.   Considering his new wall motion abnormalities on the left ventricle and reduction in LV systolic function will consult cardiology.

## 2022-09-13 NOTE — Assessment & Plan Note (Addendum)
Continue with aspirin and statin therapy.   Patient had aortobifemoral bypass in 2005. Has severe SFA disease bilaterally with ABIs in 0,87 range.

## 2022-09-14 ENCOUNTER — Inpatient Hospital Stay (HOSPITAL_COMMUNITY): Payer: Medicare Other

## 2022-09-14 ENCOUNTER — Telehealth (HOSPITAL_COMMUNITY): Payer: Self-pay

## 2022-09-14 ENCOUNTER — Other Ambulatory Visit (HOSPITAL_COMMUNITY): Payer: Self-pay

## 2022-09-14 DIAGNOSIS — N1831 Chronic kidney disease, stage 3a: Secondary | ICD-10-CM | POA: Insufficient documentation

## 2022-09-14 DIAGNOSIS — I5033 Acute on chronic diastolic (congestive) heart failure: Secondary | ICD-10-CM | POA: Diagnosis not present

## 2022-09-14 DIAGNOSIS — I2510 Atherosclerotic heart disease of native coronary artery without angina pectoris: Secondary | ICD-10-CM | POA: Diagnosis not present

## 2022-09-14 DIAGNOSIS — R7989 Other specified abnormal findings of blood chemistry: Secondary | ICD-10-CM

## 2022-09-14 DIAGNOSIS — G4733 Obstructive sleep apnea (adult) (pediatric): Secondary | ICD-10-CM | POA: Diagnosis not present

## 2022-09-14 DIAGNOSIS — I10 Essential (primary) hypertension: Secondary | ICD-10-CM | POA: Diagnosis not present

## 2022-09-14 LAB — CBC WITH DIFFERENTIAL/PLATELET
Abs Immature Granulocytes: 0.21 10*3/uL — ABNORMAL HIGH (ref 0.00–0.07)
Basophils Absolute: 0 10*3/uL (ref 0.0–0.1)
Basophils Relative: 0 %
Eosinophils Absolute: 0 10*3/uL (ref 0.0–0.5)
Eosinophils Relative: 0 %
HCT: 30 % — ABNORMAL LOW (ref 39.0–52.0)
Hemoglobin: 9.8 g/dL — ABNORMAL LOW (ref 13.0–17.0)
Immature Granulocytes: 2 %
Lymphocytes Relative: 4 %
Lymphs Abs: 0.6 10*3/uL — ABNORMAL LOW (ref 0.7–4.0)
MCH: 32.3 pg (ref 26.0–34.0)
MCHC: 32.7 g/dL (ref 30.0–36.0)
MCV: 99 fL (ref 80.0–100.0)
Monocytes Absolute: 1.6 10*3/uL — ABNORMAL HIGH (ref 0.1–1.0)
Monocytes Relative: 11 %
Neutro Abs: 11.7 10*3/uL — ABNORMAL HIGH (ref 1.7–7.7)
Neutrophils Relative %: 83 %
Platelets: 283 10*3/uL (ref 150–400)
RBC: 3.03 MIL/uL — ABNORMAL LOW (ref 4.22–5.81)
RDW: 13.2 % (ref 11.5–15.5)
WBC: 14.1 10*3/uL — ABNORMAL HIGH (ref 4.0–10.5)
nRBC: 0 % (ref 0.0–0.2)

## 2022-09-14 LAB — GLUCOSE, CAPILLARY
Glucose-Capillary: 148 mg/dL — ABNORMAL HIGH (ref 70–99)
Glucose-Capillary: 128 mg/dL — ABNORMAL HIGH (ref 70–99)
Glucose-Capillary: 150 mg/dL — ABNORMAL HIGH (ref 70–99)
Glucose-Capillary: 131 mg/dL — ABNORMAL HIGH (ref 70–99)

## 2022-09-14 LAB — MAGNESIUM: Magnesium: 1.8 mg/dL (ref 1.7–2.4)

## 2022-09-14 LAB — BASIC METABOLIC PANEL
Anion gap: 13 (ref 5–15)
BUN: 21 mg/dL (ref 8–23)
CO2: 25 mmol/L (ref 22–32)
Calcium: 8.4 mg/dL — ABNORMAL LOW (ref 8.9–10.3)
Chloride: 93 mmol/L — ABNORMAL LOW (ref 98–111)
Creatinine, Ser: 1.52 mg/dL — ABNORMAL HIGH (ref 0.61–1.24)
GFR, Estimated: 47 mL/min — ABNORMAL LOW (ref >60)
Glucose, Bld: 118 mg/dL — ABNORMAL HIGH (ref 70–99)
Potassium: 3.6 mmol/L (ref 3.5–5.1)
Sodium: 131 mmol/L — ABNORMAL LOW (ref 135–145)

## 2022-09-14 LAB — ECHOCARDIOGRAM LIMITED
Height: 67 in
S' Lateral: 4.6 cm
Weight: 3199.32 oz

## 2022-09-14 MED ORDER — DICLOFENAC SODIUM 1 % EX GEL
4.0000 g | Freq: Four times a day (QID) | CUTANEOUS | Status: DC
  Administered 2022-09-14 – 2022-09-24 (×30): 4 g via TOPICAL
  Filled 2022-09-14: qty 100

## 2022-09-14 MED ORDER — MAGNESIUM SULFATE 2 GM/50ML IV SOLN
2.0000 g | Freq: Once | INTRAVENOUS | Status: AC
  Administered 2022-09-14: 2 g via INTRAVENOUS
  Filled 2022-09-14: qty 50

## 2022-09-14 NOTE — Assessment & Plan Note (Addendum)
AKI, hyponatremia.   Renal function today with serum cr at 1,43 with K at 3,6 and serum bicarbonate at 22. Na 133   Add 40 kcl po today.  Patient with encephalopathy with poor oral intake, sepsis syndrome, will resume IV fluid with isotonic saline at 50 ml per hr.

## 2022-09-14 NOTE — Telephone Encounter (Signed)
Pharmacy Patient Advocate Encounter  Insurance verification completed.    The patient is insured through [employer]   The patient is currently admitted and ran test claims for the following: Jardiance.  Copays and coinsurance results were relayed to Inpatient clinical team.

## 2022-09-14 NOTE — Progress Notes (Signed)
Occupational Therapy Treatment Patient Details Name: Johnny [name] MRN: [id number] DOB: 09-17-1945 Today's Date: 09/14/2022   History of present illness Pt is a 77 y/o M presenting to ED on with hand and BLE edema, weakness, dyspnea with exertion,CXR revealing lower L atelectasiss, no pleural effusion or edema. Admitted for CHF exacerbation. PMH includes MI, PAD, aortic stenosis, CAD s/p CABG, HTN, AAA, hyperlipidemia, chronic pruritis/dermal hypersensitivity reaction, OSA.   OT comments  Pt making slow progression towards goals this session, limited by R knee pain. Pt reports this is chronic but hasn't been "this bad" before. Pt needing min-mod A for step pivot transfer to [facility], R knee buckling x2 needing mod A to correct. Pt min A for short distance ambulation around bed, increased steadiness with RW and pt reports pain is decreased if he steps with RLE externally rotated, PT reconsulted. Provided pt with energy conservation handout and reviewed strategies for home, pt verbalized understanding. Pt presenting with impairments listed below, will follow acutely. Updating d/c recommendation to HHOT at d/c pending progression.   Recommendations for follow up therapy are one component of a multi-disciplinary discharge planning process, led by the attending physician.  Recommendations may be updated based on patient status, additional functional criteria and insurance authorization.    Assistance Recommended at Discharge Intermittent Supervision/Assistance  Patient can return home with the following  A little help with walking and/or transfers;A little help with bathing/dressing/bathroom;Assistance with cooking/housework;Assist for transportation;Help with stairs or ramp for entrance   Equipment Recommendations  Tub/shower seat;Other (comment) (RW)    Recommendations for Other Services PT consult    Precautions / Restrictions Precautions Precautions: Fall Precaution Comments: R knee buckling  5/20 Restrictions Weight Bearing Restrictions: No       Mobility Bed Mobility Overal bed mobility: Modified Independent                  Transfers Overall transfer level: Needs assistance Equipment used: Rolling walker (2 wheels), None Transfers: Sit to/from Stand, Bed to chair/wheelchair/BSC Sit to Stand: Min assist, Mod assist     Step pivot transfers: Min assist     General transfer comment: min-mod A for transfers, R knee buckling x2 during session, able to walk short distance around bed to chair with RW     Balance Overall balance assessment: Needs assistance Sitting-balance support: Feet supported Sitting balance-Leahy Scale: Good Sitting balance - Comments: reaches outside BOS without LOB   Standing balance support: During functional activity Standing balance-Leahy Scale: Poor Standing balance comment: benefits from RW for external support                           ADL either performed or assessed with clinical judgement   ADL Overall ADL's : Needs assistance/impaired                     Lower Body Dressing: Minimal assistance;Sitting/lateral leans Lower Body Dressing Details (indicate cue type and reason): limited by RLE pain Toilet Transfer: Moderate assistance;Stand-pivot;BSC/3in1;Minimal assistance Toilet Transfer Details (indicate cue type and reason): mod A to correct R knee buckling/LOB         Functional mobility during ADLs: Moderate assistance;Rolling walker (2 wheels);Minimal assistance      Extremity/Trunk Assessment Upper Extremity Assessment Upper Extremity Assessment: Overall WFL for tasks assessed   Lower Extremity Assessment Lower Extremity Assessment: Generalized weakness        Vision   Vision Assessment?: No apparent visual deficits  Perception Perception Perception: Not tested   Praxis Praxis Praxis: Not tested    Cognition Arousal/Alertness: Awake/alert Behavior During Therapy: WFL for tasks  assessed/performed Overall Cognitive Status: Within Functional Limits for tasks assessed                                          Exercises      Shoulder Instructions       General Comments VSS on RA    Pertinent Vitals/ Pain       Pain Assessment Pain Assessment: Faces Pain Score: 8  Faces Pain Scale: Hurts whole lot Pain Location: R knee Pain Descriptors / Indicators: Discomfort Pain Intervention(s): Limited activity within patient's tolerance, Monitored during session, Repositioned  Home Living                                          Prior Functioning/Environment              Frequency  Min 1X/week        Progress Toward Goals  OT Goals(current goals can now be found in the care plan section)  Progress towards OT goals: Progressing toward goals  Acute Rehab OT Goals Patient Stated Goal: none stated OT Goal Formulation: With patient Time For Goal Achievement: 09/27/22 Potential to Achieve Goals: Good ADL Goals Pt Will Perform Upper Body Dressing: with supervision;sitting Pt Will Perform Lower Body Dressing: with supervision;sitting/lateral leans;sit to/from stand Pt Will Transfer to Toilet: with supervision;ambulating;regular height toilet Pt Will Perform Tub/Shower Transfer: Tub transfer;Shower transfer;with supervision;ambulating;shower seat Additional ADL Goal #1: pt will verbalize 3 energy conservation strategies in prep for ADLs  Plan Frequency remains appropriate;Discharge plan needs to be updated    Co-evaluation                 AM-PAC OT "6 Clicks" Daily Activity     Outcome Measure   Help from another person eating meals?: None Help from another person taking care of personal grooming?: A Little Help from another person toileting, which includes using toliet, bedpan, or urinal?: A Little Help from another person bathing (including washing, rinsing, drying)?: A Little Help from another person to  put on and taking off regular upper body clothing?: A Little Help from another person to put on and taking off regular lower body clothing?: A Little 6 Click Score: 19    End of Session Equipment Utilized During Treatment: Gait belt;Rolling walker (2 wheels)  OT Visit Diagnosis: Unsteadiness on feet (R26.81);Other abnormalities of gait and mobility (R26.89);Muscle weakness (generalized) (M62.81);History of falling (Z91.81)   Activity Tolerance Patient tolerated treatment well   Patient Left with call bell/phone within reach;in chair;with chair alarm set;with nursing/sitter in room   Nurse Communication Mobility status        Time: 1610-9604 OT Time Calculation (min): 20 min  Charges: OT General Charges $OT Visit: 1 Visit OT Treatments $Therapeutic Activity: 8-22 mins  Johnny Fila, OTD, OTR/L SecureChat Preferred Acute Rehab [phone]   Johnny [name] 09/14/2022, 8:41 AM

## 2022-09-14 NOTE — Progress Notes (Signed)
Patient c/o upper abdominal pain.  Abdomen is distended, and he reports no BM x 1 week.  Miralax given per PRN order.  Johnny [name]

## 2022-09-14 NOTE — Plan of Care (Signed)

## 2022-09-14 NOTE — Progress Notes (Signed)
Patient refused CPAP for the night  

## 2022-09-14 NOTE — TOC Benefit Eligibility Note (Signed)
Patient Product/process development scientist completed.    The patient is currently admitted and upon discharge could be taking Jardiance.  The current 30 day co-pay is $47.00.   The patient is insured through [employer]   This test claim was processed through [facility]- copay amounts may vary at other pharmacies due to pharmacy/plan contracts, or as the patient moves through the different stages of their insurance plan.

## 2022-09-14 NOTE — Progress Notes (Signed)
Progress Note   Patient: Johnny [name] ZOX:[id number] DOB: 10-May-1945 DOA: 09/12/2022     1 DOS: the patient was seen and examined on 09/14/2022   Brief hospital course: Johnny [name] was admitted to the hospital with the working diagnosis of heart failure exacerbation.   77 yo male with the past medical history of coronary artery disease, sp CABG, abdominal aortic aneurysm, hypertension, obesity and dyslipidemia who presented with dyspnea. Reported progressive lower and upper extremity edema, along with dyspnea. On his initial physical examination his blood pressure was 130/69, HR 73m RR 16 and 02 saturation 97%, heart with S1 and S2 present and rhythmic, lungs with no wheezing or rales, abdomen with no distention, positive lower extremity edema.   Na 131, K 3,2 Cl 93, bicarbonate 24, glucose 143, bun 24, cr 1.29  Mg 1,7  High sensitive troponin 183 and 150  Wbc 13,4 hgb 10.0 plt 244  Sars covid 19 negative   Chest radiograph with cardiomegaly, bilateral hilar vascular congestion, bilateral atelectasis at bases with no effusions. Sternotomy wires in place.   EKG 75 bpm, normal axis, normal intervals, sinus rhythm with no significant ST segment, negative T wave lead V1 and V2. (Old changes).   05/ 20 patient has responded well to diuresis. Possible discharge tomorrow.  Check limited echocardiogram for elevated troponin.   Assessment and Plan: * Acute on chronic diastolic CHF (congestive heart failure) (HCC) Echocardiogram with preserved LV systolic function, EF 55%, no LVH, RV mild reduction in systolic function, RVSP 30,5 mmHg. Mild dilatation of LA, mild TR, mild aortic stenosis.   Urine output is 600  ml Patient has lost about one Kg since admission. His edema has improved.  Systolic blood pressure 130 to 109 mmHg.   Continue medical therapy with SGLT 2 inh, spironolactone and losartan.  Hold on furosemide for now.   Will request a limited echocardiogram due to elevated troponin.  Possible elevation of troponin due to heart failure exacerbation.  He is not having chest pain or angina.   Essential hypertension Plan to discontinue amlodipine and will add losartan for blood pressure control.  Continue diuresis SGLT 2 inh and MRA.   Coronary artery disease No chest pain, continue with aspirin and statin therapy.   Hyperlipidemia Continue with ezetimibe and simvastatin.   Peripheral arterial disease (HCC) Continue with aspirin and statin therapy.   Obstructive sleep apnea C pap.   Class 1 obesity Calculated BMI is 31.6  Leukocytosis Reactive leukocytosis, he has been afebrile.  Chest radiograph with no pneumonia Add incentive spirometer.  Wbc continue to be high at 14.1 with 11,7 PMN.  Continue to hold on antibiotics and check cell count tomorrow.  CKD stage 3a, GFR 45-59 ml/min (HCC) Renal function with serum cr at 1,52 with K at 3,6 and serum bicarbonate at 25. Na 131 and Mg 1,8.  Plan to add 2 g mag sulfate and follow up renal function and electrolytes in am. Hold on furosemide for now.        Subjective: Patient is feeling better, dyspnea and edema have improved.   Physical Exam: Vitals:   09/13/22 1520 09/13/22 1942 09/14/22 0451 09/14/22 1233  BP: 131/81 139/69 137/70 109/68  Pulse: 83 92 92 90  Resp: (!) 28 (!) 22 (!) 24   Temp: 98.7 F (37.1 C) 98.9 F (37.2 C) 99.3 F (37.4 C) 98 F (36.7 C)  TempSrc: Oral Oral Oral Oral  SpO2: 96% 94% 92% 95%  Weight:   90.7 kg  Height:       Neurology awake and alert ENT with mild pallor Cardiovascular with S1 and S2 present and rhythmic with no gallops, rubs or murmurs No JVD No lower extremity edema Respiratory with no rales or wheezing, no rhonchi Abdomen with no distention  Data Reviewed:    Family Communication: no family at the bedside.   I spoke with patient's son. Over the phone, we talked in detail about patient's condition, plan of care and prognosis and all questions were  addressed.   Disposition: Status is: Inpatient Remains inpatient appropriate because: heart failure possible dc tomorrow home   Planned Discharge Destination: Home   \ Author: Coralie Keens, MD 09/14/2022 2:32 PM  For on call review [email or web address].

## 2022-09-15 ENCOUNTER — Inpatient Hospital Stay (HOSPITAL_COMMUNITY): Payer: Medicare Other

## 2022-09-15 DIAGNOSIS — E782 Mixed hyperlipidemia: Secondary | ICD-10-CM | POA: Diagnosis not present

## 2022-09-15 DIAGNOSIS — I5033 Acute on chronic diastolic (congestive) heart failure: Secondary | ICD-10-CM | POA: Diagnosis not present

## 2022-09-15 DIAGNOSIS — E440 Moderate protein-calorie malnutrition: Secondary | ICD-10-CM | POA: Insufficient documentation

## 2022-09-15 DIAGNOSIS — I2510 Atherosclerotic heart disease of native coronary artery without angina pectoris: Secondary | ICD-10-CM | POA: Diagnosis not present

## 2022-09-15 DIAGNOSIS — I10 Essential (primary) hypertension: Secondary | ICD-10-CM | POA: Diagnosis not present

## 2022-09-15 LAB — CBC WITH DIFFERENTIAL/PLATELET
Abs Immature Granulocytes: 0.19 10*3/uL — ABNORMAL HIGH (ref 0.00–0.07)
Basophils Absolute: 0 10*3/uL (ref 0.0–0.1)
Basophils Relative: 0 %
Eosinophils Absolute: 0.1 10*3/uL (ref 0.0–0.5)
Eosinophils Relative: 0 %
HCT: 32.2 % — ABNORMAL LOW (ref 39.0–52.0)
Hemoglobin: 10.6 g/dL — ABNORMAL LOW (ref 13.0–17.0)
Immature Granulocytes: 1 %
Lymphocytes Relative: 3 %
Lymphs Abs: 0.6 10*3/uL — ABNORMAL LOW (ref 0.7–4.0)
MCH: 33 pg (ref 26.0–34.0)
MCHC: 32.9 g/dL (ref 30.0–36.0)
MCV: 100.3 fL — ABNORMAL HIGH (ref 80.0–100.0)
Monocytes Absolute: 1.5 10*3/uL — ABNORMAL HIGH (ref 0.1–1.0)
Monocytes Relative: 8 %
Neutro Abs: 16.9 10*3/uL — ABNORMAL HIGH (ref 1.7–7.7)
Neutrophils Relative %: 88 %
Platelets: 377 10*3/uL (ref 150–400)
RBC: 3.21 MIL/uL — ABNORMAL LOW (ref 4.22–5.81)
RDW: 13.2 % (ref 11.5–15.5)
WBC: 19.2 10*3/uL — ABNORMAL HIGH (ref 4.0–10.5)
nRBC: 0 % (ref 0.0–0.2)

## 2022-09-15 LAB — GLUCOSE, CAPILLARY
Glucose-Capillary: 124 mg/dL — ABNORMAL HIGH (ref 70–99)
Glucose-Capillary: 128 mg/dL — ABNORMAL HIGH (ref 70–99)
Glucose-Capillary: 118 mg/dL — ABNORMAL HIGH (ref 70–99)
Glucose-Capillary: 187 mg/dL — ABNORMAL HIGH (ref 70–99)
Glucose-Capillary: 152 mg/dL — ABNORMAL HIGH (ref 70–99)

## 2022-09-15 LAB — BASIC METABOLIC PANEL
Anion gap: 15 (ref 5–15)
BUN: 31 mg/dL — ABNORMAL HIGH (ref 8–23)
CO2: 25 mmol/L (ref 22–32)
Calcium: 8.7 mg/dL — ABNORMAL LOW (ref 8.9–10.3)
Chloride: 93 mmol/L — ABNORMAL LOW (ref 98–111)
Creatinine, Ser: 1.79 mg/dL — ABNORMAL HIGH (ref 0.61–1.24)
GFR, Estimated: 38 mL/min — ABNORMAL LOW (ref >60)
Glucose, Bld: 160 mg/dL — ABNORMAL HIGH (ref 70–99)
Potassium: 3.7 mmol/L (ref 3.5–5.1)
Sodium: 133 mmol/L — ABNORMAL LOW (ref 135–145)

## 2022-09-15 MED ORDER — SODIUM CHLORIDE 0.9 % IV SOLN
2.0000 g | INTRAVENOUS | Status: DC
  Administered 2022-09-15: 2 g via INTRAVENOUS
  Filled 2022-09-15: qty 20

## 2022-09-15 MED ORDER — THIAMINE HCL 100 MG/ML IJ SOLN
500.0000 mg | INTRAVENOUS | Status: AC
  Administered 2022-09-15 – 2022-09-19 (×4): 500 mg via INTRAVENOUS
  Filled 2022-09-15 (×6): qty 5

## 2022-09-15 MED ORDER — MORPHINE SULFATE (PF) 2 MG/ML IV SOLN
1.0000 mg | INTRAVENOUS | Status: DC | PRN
  Administered 2022-09-20 – 2022-09-23 (×4): 1 mg via INTRAVENOUS
  Filled 2022-09-15 (×5): qty 1

## 2022-09-15 MED ORDER — ENSURE ENLIVE PO LIQD
237.0000 mL | Freq: Three times a day (TID) | ORAL | Status: DC
  Administered 2022-09-15 – 2022-09-23 (×17): 237 mL via ORAL

## 2022-09-15 MED ORDER — SIMVASTATIN 20 MG PO TABS
40.0000 mg | ORAL_TABLET | Freq: Every day | ORAL | Status: DC
  Administered 2022-09-15 – 2022-09-24 (×10): 40 mg via ORAL
  Filled 2022-09-15 (×10): qty 2

## 2022-09-15 MED ORDER — ADULT MULTIVITAMIN W/MINERALS CH
1.0000 | ORAL_TABLET | Freq: Every day | ORAL | Status: DC
  Administered 2022-09-15 – 2022-09-24 (×10): 1 via ORAL
  Filled 2022-09-15 (×10): qty 1

## 2022-09-15 NOTE — Progress Notes (Addendum)
Progress Note   Patient: Johnny [name] WGN:[id number] DOB: [birth year] DOA: 09/12/2022     2 DOS: the patient was seen and examined on 09/15/2022   Brief hospital course: Johnny [name] was admitted to the hospital with the working diagnosis of heart failure exacerbation.   77 yo male with the past medical history of coronary artery disease, sp CABG, abdominal aortic aneurysm, hypertension, obesity and dyslipidemia who presented with dyspnea. Reported progressive lower and upper extremity edema, along with dyspnea. On his initial physical examination his blood pressure was 130/69, HR 15m RR 16 and 02 saturation 97%, heart with S1 and S2 present and rhythmic, lungs with no wheezing or rales, abdomen with no distention, positive lower extremity edema.   Na 131, K 3,2 Cl 93, bicarbonate 24, glucose 143, bun 24, cr 1.29  Mg 1,7  High sensitive troponin 183 and 150  Wbc 13,4 hgb 10.0 plt 244  Sars covid 19 negative   Chest radiograph with cardiomegaly, bilateral hilar vascular congestion, bilateral atelectasis at bases with no effusions. Sternotomy wires in place.   EKG 75 bpm, normal axis, normal intervals, sinus rhythm with no significant ST segment, negative T wave lead V1 and V2. (Old changes).   05/ 20 patient has responded well to diuresis. Possible discharge tomorrow.  Check limited echocardiogram for elevated troponin.  05/21 echocardiogram with mild reduction in LV systolic function and positive new wall motion abnormalities.   Assessment and Plan: * Acute on chronic diastolic CHF (congestive heart failure) (HCC) Limited echocardiogram with LV systolic function mildly reduced to 40 to 45%, hypokinesis of the basal-mid antero septal wall, inferoseptal wall and anterior wall.   Volume status has improved.   3 Kg weight loss since admission.  Systolic blood pressure 105 to 117 mmHg.   Continue medical therapy with SGLT 2 inh, spironolactone and losartan.  Continue holding loop diuretic  for now.   Considering his new wall motion abnormalities on the left ventricle and reduction in LV systolic function will consult cardiology.   Essential hypertension Continue blood pressure control with losartan, spironolactone.    Coronary artery disease Patient had decreased energy for 24 hrs prior to admission.  Old records personally reviewed, patient had CABG in 1992 and non ischemic Myoview in 2012.  He has not had frank angina.   Continue aspirin and increase dose of statin.  Patient may need ischemic work up.   CKD stage 3a, GFR 45-59 ml/min (HCC) AKI, hyponatremia.   Today renal function with serum cr at 1,79, K at 3,7 and serum bicarbonate at 25.  Na 133  Continue losartan, spironolactone and SGLT 2 inh.  Holding on loop diuretic.  Follow up renal function in am.   Peripheral arterial disease (HCC) Continue with aspirin and statin therapy.   Patient had aortobifemoral bypass in 2005. Has severe SFA disease bilaterally with ABIs in 0,87 range.   Hyperlipidemia Continue with ezetimibe and simvastatin (increase dose to 40 mg).   Obstructive sleep apnea C pap.   Class 1 obesity Calculated BMI is 31.6  Leukocytosis Reactive leukocytosis, he has been afebrile.  Chest radiograph with no pneumonia Add incentive spirometer.  Wbc continue to be high at 14.1 with 11,7 PMN.  Continue to hold on antibiotics and check cell count tomorrow.   Subjective: Patient with no chest pain or dyspnea, edema has improved and he is feeling better. Positive right knee pain, worse with movement.   Physical Exam: Vitals:   09/14/22 1719 09/14/22 2036 09/15/22 0038  09/15/22 0434  BP: (!) 121/59 136/68 117/67 105/67  Pulse: 81 94 93 79  Resp:  18 17 15   Temp: 98 F (36.7 C) 98.6 F (37 C) 98.6 F (37 C) 98.1 F (36.7 C)  TempSrc: Oral Oral Oral Oral  SpO2:  95% 95%   Weight:    88.5 kg  Height:       Neurology awake and alert ENT with mild pallor Cardiovascular with S1  and S2 present and rhythmic with no gallops, rubs or murmurs No JVD No  lower extremity edema Respiratory with no rales or wheezing, no rhonchi Abdomen with no distention  Data Reviewed:  Family Communication: no family at the bedside. I spoke with patient's son over the phone, we talked in detail about patient's condition, plan of care and prognosis and all questions were addressed.  Disposition: Status is: Inpatient Remains inpatient appropriate because: ischemic heart disease.   Planned Discharge Destination: Home    Author: Coralie Keens, MD 09/15/2022 9:08 AM  For on call review [email or web address].

## 2022-09-15 NOTE — Progress Notes (Signed)
Initial Nutrition Assessment  DOCUMENTATION CODES:   Non-severe (moderate) malnutrition in context of chronic illness  INTERVENTION:   - Ensure Enlive po TID, each supplement provides 350 kcal and 20 grams of protein  - Liberalize diet to Regular  - MVI with minerals daily  NUTRITION DIAGNOSIS:   Moderate Malnutrition related to chronic illness (CHF) as evidenced by mild fat depletion, moderate muscle depletion.  GOAL:   Patient will meet greater than or equal to 90% of their needs  MONITOR:   PO intake, Labs, Weight trends, I & O's  REASON FOR ASSESSMENT:   Consult Assessment of nutrition requirement/status  ASSESSMENT:   77 year old male who presented to the ED on 5/18 with SOB. PMH of CAD s/p CABG, abdominal aortic aneurysm, PVD, essential HTN, HLD, chronic pruritus/dermal hypersensitivity reaction, OSA. Pt admitted with acute CHF exacerbation.  Spoke with pt at bedside. Pt reports poor appetite. He states that he is eating 1-2 bites of each food on his meal tray at mealtimes. For example, pt ate 1 bite of broccoli and 2 bites of mashed potatoes for dinner last night. He states that he ate 2 bites of omelet for breakfast and drank 2 sips of milk.  Pt reports that poor appetite starts when he wakes up in the morning. Unclear how long pt's appetite has been decreased but suspect it has been this way for months given pt report.  Pt reports that he used to eat breakfast but skips it now. If pt eats lunch, he has soup or a sandwich. Supper is usually leftovers. Pt reports that he does drink 1-2 Ensure supplements daily at home and prefers the strawberry flavor.  Pt endorses weight loss. He states that he used to weigh 236 lbs about 5-6 years ago. He reports intentionally losing "5 lbs at a time" and states that he got down to 230 lbs. Current weight is 195 lbs. Suspect pt is not aware of just how much weight he has lost over the years. Pt does share that he does not have "any  muscle tone left." Pt reports feeling very tired and weak.  Reviewed weight history in chart. Pt with a slowly but steady decline in weight over the years. Pt has lost 6.9 kg since 08/12/22; this is a 7.2% weight loss in 1 month which is significant for timeframe. However, it is difficult to determine how much of weight loss is true dry weight loss vs fluid loss due to diuresis. Pt has lost 3.3 kg so far this admission and is net negative 1.5 L.  Based on NFPE, pt meets criteria for moderate chronic malnutrition.  Pt willing to consume oral nutrition supplements during admission. RD brought pt a strawberry Ensure at time of visit. RD will also liberalize diet to Regular to provide maximum food options at mealtimes. Will order daily MVI with minerals.  Admit weight: 91.8 kg Current weight: 88.5 kg  Medications reviewed and include: jardiance, pepcid, SSI, spironolactone  Labs reviewed: sodium 133, chloride 93, BUN 31, creatinine 1.79, WBC 14.1, hemoglobin 9.8 CBG's: 124-150 x 24 hours  UOP: 650 ml x 24 hours I/O's: -1.5 L since admit  NUTRITION - FOCUSED PHYSICAL EXAM:  Flowsheet Row Most Recent Value  Orbital Region Mild depletion  Upper Arm Region No depletion  Thoracic and Lumbar Region No depletion  Buccal Region Mild depletion  Temple Region No depletion  Clavicle Bone Region No depletion  Clavicle and Acromion Bone Region Moderate depletion  Scapular Bone Region Mild depletion  Dorsal Hand Moderate depletion  Patellar Region Moderate depletion  Anterior Thigh Region Moderate depletion  Posterior Calf Region Moderate depletion  Edema (RD Assessment) Mild  [BLE]  Hair Reviewed  Eyes Reviewed  Mouth Reviewed  Skin Reviewed  Nails Reviewed       Diet Order:   Diet Order             Diet regular Room service appropriate? Yes with Assist; Fluid consistency: Thin  Diet effective now                   EDUCATION NEEDS:   Education needs have been  addressed  Skin:  Skin Assessment: Reviewed RN Assessment  Last BM:  no documented BM  Height:   Ht Readings from Last 1 Encounters:  09/12/22 5\' 7"  (1.702 m)    Weight:   Wt Readings from Last 1 Encounters:  09/15/22 88.5 kg    BMI:  Body mass index is 30.54 kg/m.  Estimated Nutritional Needs:   Kcal:  1900-2100  Protein:  100-115 grams  Fluid:  >1.8 L    Mertie Clause, MS, RD, LDN Inpatient Clinical Dietitian Please see AM[facility] for contact information.

## 2022-09-15 NOTE — Progress Notes (Signed)
Increased pain in right knee and heel. Right knee swelling; patient states its more than previous. PRN pain med helping a little. Unable to ambulate. Significant mobility change. Required heavy 2 assist just to transfer to bedside commode. Patient seems hesitant to bend leg much, move it quickly, or put weight on it. Likely pain is major factor.

## 2022-09-15 NOTE — Progress Notes (Addendum)
Patient having right knee pain, positive local edema and tenderness to palpation.  He has been more confused this afternoon.  Positive malnutrition per nutrition consultation.            I think patient has arthritis flare from osteoarthritis/ possible infection. His wbc is high but there is no significant erythema or increased local temperature. Right knee radiograph with lucency underlying the portions of the tibial ray, which can have an association with aseptic loosening. Small knee effusion.  Dr Lequita Halt did his knee surgery in 2012, I have called his office and requested a call back to discuss this findings.   Will continue topical diclofenac and will add IV morphine for pain control.  Monitor temperature curve and follow up wbc in am,  Add thiamine IV.  Will start empiric antibiotic  for now.

## 2022-09-15 NOTE — Consult Note (Addendum)
Cardiology Consultation   Patient ID: SULLIVAN KARWOWSKI MRN: [id number]; DOB: 21-Oct-1945  Admit date: 09/12/2022 Date of Consult: 09/15/2022  PCP:  Daisy Floro, MD   [facility] Providers Cardiologist:  Nanetta Batty, MD   {   Patient Profile:   Johnny [name] is a 77 y.o. male with a hx of CAD status post coronary artery bypass grafting in 1992,  aortobifemoral bypass grafting secondary to abdominal aortic aneurysm in 2005, hypertension, hyperlipidemia, OSA on CPAP, mild aortic stenosis, ascending aortic dilatation (43mm), CKD stage 3a, who is being seen 09/15/2022 for the evaluation of new onset congestive heart failure at the request of Dr. Ella Jubilee.  History of Present Illness:   Johnny [name] has history significant for CABG done in 1992 and has had a nonischemic Myoview in March 2014 noting residual scarring.  Additionally has history of PAD with aortic bifemoral bypass grafting and has known severe SFA disease bilaterally with diminished ABIs. At his last visit with Dr. Gery Pray in March 2023 patient had some slight increase in dyspnea when walking at an incline or while playing 9 holes of golf.  He denied any chest pain at the time.  Overall was doing good with mild limitations with claudication.  A echocardiogram in January 2024 showed LVEF of 55% with no regional wall motion abnormalities.  There was some moderate calcification of the aortic valve with mild aortic valve stenosis with a mean gradient of 10.8 mmHg.  Patient initially presented on 09/12/2022 with worsening symptoms of progressive upper and lower extremity edema associated with dyspnea on exertion.  After discussing with the patient it appears that he has had a persistent cough and has worsening functional capacity in the last several months.  Patient is not able to exactly describe when his symptoms started however does note that he feels significantly weaker, more orthopneic, and short of breath.  Occasionally  patient has a cough that is been associated with some chest pain but otherwise has had no chest pain otherwise.  Throughout his admission he has had issues with hypervolemia but has been responding well to IV Lasix.  Echocardiogram was not ordered until yesterday on 09/14/2022. Cardiology was consulted for new onset of reduced EF measured to be 40 to 45% with regional wall motion abnormalities.  There is hypokinesis of the left ventricular, basal to mid anteroseptal wall, septal wall and anterior wall. BNP 131.  In addition, patient had elevated troponins 183-150 on admission.  Chest x-ray demonstrating enlargement of the cardiac silhouette, no evidence of pulmonary congestion. SGLT2 inhibitor and spironolactone were added in addition to losartan.    Of note: Patient is appears very weak with AMS.  He was very slow to answer questions throughout my evaluation and frequently would answer questions incorrectly with long pauses.  Patient was not oriented to time, month, year.  Additionally patient has a significant decrease in mobility.  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  Additionally he has been complaining of swelling in his right leg, on the side of his knee replacement.  After discussing with nursing staff the swelling of his knee and the decreased cognitive function is new today. His IM attending was notified of this change. Subsquent to this encounter he began hallucinating about seeing bugs and Dr. Ella Jubilee is aware to evaluate.   Patient has had an increasing white count since admission, up  to 19k today.  Creatinine 1.79.  GFR 38.  Sodium 133.  BUN 31  Past Medical History:  Diagnosis Date   Abdominal aortic aneurysm [facility])    Cardiac murmur    Coronary artery disease    Heart attack (HCC)    Hemorrhoids    Occasional bleeding    Hiatal hernia    History of  myocardial infarction 1992   History of rheumatic fever    Hyperlipidemia    Hypertension    Obstructive sleep apnea    Osteoarthritis of knee    Right knee   Peripheral arterial disease (HCC)    status post aortobifemoral bypass grafting by Dr. Hart Rochester in 2005   Sleep apnea     Past Surgical History:  Procedure Laterality Date   ABDOMINAL AORTIC ANEURYSM REPAIR  2005   CARDIAC CATHETERIZATION  1992   followed by open heart coronary artery bypass grafting of 4 vessels post MI   CHOLECYSTECTOMY     CORONARY ARTERY BYPASS GRAFT     REPLACEMENT TOTAL KNEE     Right knee cartilage surgery in 1976     Inpatient Medications: Scheduled Meds:  aspirin EC  81 mg Oral Daily   diclofenac Sodium  4 g Topical QID   empagliflozin  10 mg Oral Daily   ezetimibe  10 mg Oral Daily   famotidine  40 mg Oral Daily   feeding supplement  237 mL Oral TID BM   insulin aspart  0-5 Units Subcutaneous QHS   insulin aspart  0-9 Units Subcutaneous TID WC   losartan  25 mg Oral Daily   multivitamin with minerals  1 tablet Oral Daily   simvastatin  40 mg Oral Daily   spironolactone  12.5 mg Oral Daily   Continuous Infusions:  PRN Meds: acetaminophen, alum & mag hydroxide-simeth, diphenhydrAMINE, melatonin, polyethylene glycol, traMADol  Allergies:    Allergies  Allergen Reactions   Statins Other (See Comments)    Intolerance     Social History:   Social History   Socioeconomic History   Marital status: Married    Spouse name: Not on file   Number of children: Not on file   Years of education: Not on file   Highest education level: Not on file  Occupational History   Not on file  Tobacco Use   Smoking status: Former    Types: Cigarettes    Quit date: 08/26/1990    Years since quitting: 32.0    Passive exposure: Never   Smokeless tobacco: Never  Vaping Use   Vaping Use: Never used  Substance and Sexual Activity   Alcohol use: Yes    Comment: occassional    Drug use: No   Sexual  activity: Not Currently  Other Topics Concern   Not on file  Social History Narrative   Not on file   Social Determinants of Health   Financial Resource Strain: Not on file  Food Insecurity: No Food Insecurity (09/12/2022)   Hunger Vital Sign    Worried About Running Out of Food in the Last Year: Never true    Ran Out of Food in the Last Year: Never true  Transportation Needs: No Transportation Needs (09/12/2022)   PRAPARE - Administrator, Civil Service (Medical): No    Lack of Transportation (Non-Medical): No  Physical Activity: Not on file  Stress: Not on file  Social Connections: Not on file  Intimate Partner Violence: Not At Risk (09/12/2022)   Humiliation, Afraid,  Rape, and Kick questionnaire    Fear of Current or Ex-Partner: No    Emotionally Abused: No    Physically Abused: No    Sexually Abused: No    Family History:   Family History  Problem Relation Age of Onset   Dementia Mother    Dementia Father      ROS:  Please see the history of present illness.  All other ROS reviewed and negative.     Physical Exam/Data:   Vitals:   09/14/22 1719 09/14/22 2036 09/15/22 0038 09/15/22 0434  BP: (!) 121/59 136/68 117/67 105/67  Pulse: 81 94 93 79  Resp:  18 17 15   Temp: 98 F (36.7 C) 98.6 F (37 C) 98.6 F (37 C) 98.1 F (36.7 C)  TempSrc: Oral Oral Oral Oral  SpO2:  95% 95%   Weight:    88.5 kg  Height:        Intake/Output Summary (Last 24 hours) at 09/15/2022 1431 Last data filed at 09/15/2022 4098 Gross per 24 hour  Intake 1.39 ml  Output 375 ml  Net -373.61 ml      09/15/2022    4:34 AM 09/14/2022    4:51 AM 09/13/2022    6:16 AM  Last 3 Weights  Weight (lbs) 195 lb 199 lb 15.3 oz 202 lb 4.8 oz  Weight (kg) 88.451 kg 90.7 kg 91.763 kg     Body mass index is 30.54 kg/m.  General: Confused, weak, sluggish HEENT: normal Neck: no JVD Vascular: No carotid bruits; Distal pulses 2+ bilaterally Cardiac:  normal S1, S2; RRR; no murmur   Lungs:  clear to auscultation bilaterally, no wheezing, rhonchi or rales  Abd: soft, nontender, appears distended Ext: Peripheral edema noted on the right knee.  It is also warm to the touch Musculoskeletal:  No deformities, BUE and BLE strength normal and equal Skin: warm and dry  Neuro:  Poor historian, confabulation. Speech is clear but slow. No facial asymmetry  Psych:  Calm affect but hallucinations reported  EKG:  The EKG was personally reviewed and demonstrates: EKG showing normal sinus rhythm, heart rate 76.  T wave inversions in anterior leads.  Appear to be similar to readings this year however different from reading in 2023.   Telemetry:  Telemetry was personally reviewed and demonstrates: Normal sinus rhythm with heart rates in the 80s to 90s  Relevant CV Studies: Echocardiogram 09/14/2022 1. Left ventricular ejection fraction, by estimation, is 40 to 45%. The  left ventricle has mildly decreased function. The left ventricle  demonstrates regional wall motion abnormalities (see scoring  diagram/findings for description). There is hypokinesis   of the left ventricular, basal-mid anteroseptal wall, inferoseptal wall  and anterior wall.   Laboratory Data:  High Sensitivity Troponin:   Recent Labs  Lab 09/12/22 1311 09/12/22 1700  TROPONINIHS 183* 150*     Chemistry Recent Labs  Lab 09/12/22 1311 09/13/22 0256 09/14/22 0353 09/15/22 0228  NA 131* 131* 131* 133*  K 3.2* 3.6 3.6 3.7  CL 93* 94* 93* 93*  CO2 24 26 25 25   GLUCOSE 143* 153* 118* 160*  BUN 24* 19 21 31*  CREATININE 1.29* 1.28* 1.52* 1.79*  CALCIUM 8.2* 8.1* 8.4* 8.7*  MG 1.7 1.6* 1.8  --   GFRNONAA 57* 57* 47* 38*  ANIONGAP 14 11 13 15     Recent Labs  Lab 09/12/22 1311  PROT 7.1  ALBUMIN 2.9*  AST 31  ALT 25  ALKPHOS 81  BILITOT 0.9  Lipids No results for input(s): "CHOL", "TRIG", "HDL", "LABVLDL", "LDLCALC", "CHOLHDL" in the last 168 hours.  Hematology Recent Labs  Lab 09/12/22 1311  09/13/22 0256 09/14/22 0353  WBC 13.4* 13.3* 14.1*  RBC 2.99* 2.89* 3.03*  HGB 10.0* 9.6* 9.8*  HCT 30.0* 28.7* 30.0*  MCV 100.3* 99.3 99.0  MCH 33.4 33.2 32.3  MCHC 33.3 33.4 32.7  RDW 13.1 13.0 13.2  PLT 244 242 283   Thyroid No results for input(s): "TSH", "FREET4" in the last 168 hours.  BNP Recent Labs  Lab 09/12/22 1311  BNP 131.6*    DDimer No results for input(s): "DDIMER" in the last 168 hours.   Radiology/Studies:  ECHOCARDIOGRAM LIMITED  Result Date: 09/14/2022    ECHOCARDIOGRAM LIMITED REPORT   Patient Name:   AKASHDEEP TRIMNAL Date of Exam: 09/14/2022 Medical Rec #:  [id number]       Height:       67.0 in Accession #:    [id number]      Weight:       200.0 lb Date of Birth:  1946/01/16       BSA:          2.022 m Patient Age:    26 years        BP:           109/68 mmHg Patient Gender: M               HR:           86 bpm. Exam Location:  Inpatient Procedure: Limited Echo Indications:    Elevated Troponin  History:        Patient has prior history of Echocardiogram examinations, most                 recent 05/25/2022. CHF, CAD, PAD and OSA, CKD; Risk                 Factors:Hypertension and Dyslipidemia.  Sonographer:    Milbert Coulter Referring Phys: [id number] MAURICIO DANIEL ARRIEN IMPRESSIONS  1. Left ventricular ejection fraction, by estimation, is 40 to 45%. The left ventricle has mildly decreased function. The left ventricle demonstrates regional wall motion abnormalities (see scoring diagram/findings for description). There is hypokinesis  of the left ventricular, basal-mid anteroseptal wall, inferoseptal wall and anterior wall. Comparison(s): Changes from prior study are noted. Conclusion(s)/Recommendation(s): New wall motion abnormalities in the basal to mid septal and anterior walls, concerning for ischemia. Findings communicated with Dr. Ella Jubilee. FINDINGS  Left Ventricle: Left ventricular ejection fraction, by estimation, is 40 to 45%. The left ventricle has mildly decreased  function. The left ventricle demonstrates regional wall motion abnormalities. LEFT VENTRICLE PLAX 2D LVIDd:         5.30 cm LVIDs:         4.60 cm LV PW:         1.20 cm LV IVS:        1.00 cm   AORTA Ao Root diam: 4.20 cm Jodelle Red MD Electronically signed by Jodelle Red MD Signature Date/Time: 09/14/2022/6:47:35 PM    Final    DG Chest Port 1 View  Result Date: 09/13/2022 CLINICAL DATA:  Fever EXAM: PORTABLE CHEST 1 VIEW COMPARISON:  09/12/2022, 07/21/2022 FINDINGS: Stable cardiomegaly status post sternotomy and CABG. Prominent epicardial fat pads. No focal airspace consolidation, pleural effusion, or pneumothorax. IMPRESSION: No active disease. Electronically Signed   By: Duanne Guess D.O.   On: 09/13/2022 11:05   DG Chest 2 View  Result Date: 09/12/2022 CLINICAL DATA:  Lower extremity swelling, pruritis EXAM: CHEST - 2 VIEW COMPARISON:  09/12/2022 FINDINGS: Frontal and lateral views of the chest demonstrates stable enlargement of the cardiac silhouette. There is minimal bibasilar subsegmental atelectasis or scarring. A prominent epicardial fat pad along the left heart border on prior CT likely accounts for the density seen on earlier x-ray. No acute airspace disease, effusion, or pneumothorax. No acute bony abnormalities. Postsurgical changes from median sternotomy. IMPRESSION: 1. Chronic bibasilar scarring or atelectasis. No acute airspace disease. Electronically Signed   By: Sharlet Salina M.D.   On: 09/12/2022 15:10   DG Chest Port 1 View  Result Date: 09/12/2022 CLINICAL DATA:  Shortness of breath EXAM: PORTABLE CHEST 1 VIEW COMPARISON:  11/03/2010 chest radiograph FINDINGS: Telemetry leads overlie the chest. Cardiomegaly and CABG changes again noted. Ill-defined opacity overlying the LATERAL LEFT lung base noted and may represent atelectasis, scarring or nodule. No definite pleural effusion or pneumothorax noted. No acute bony abnormalities are identified. IMPRESSION:  Ill-defined opacity overlying the LATERAL LEFT lung base which may represent atelectasis, scarring or nodule. Recommend PA and LATERAL chest radiograph clinically able Electronically Signed   By: Harmon Pier M.D.   On: 09/12/2022 13:57     Assessment and Plan:   New onset of HFrEF Hypertension Patient presenting with signs of hypervolemia on admission with complaints of DOE and has been diuresed.  Subsequent echocardiogram on 09/14/2022 indicating a reduction in LVEF of 40 to 45% with new regional wall motion abnormalities in the left ventricular, basal-mid anteroseptal wall, inferoseptal wall and anterior wall.  EKG showing normal sinus rhythm with T wave inversions in the anterior leads which are similar to readings this year however different from reading in 2023.  Additionally patient had elevated troponins on admission of 183-150.  Patient has also stated a significant and sudden decline in functional capacity as well worsening symptoms of shortness of breath, orthopnea, peripheral edema.  Recommend ischemic evaluation however given altered mental status, possible infection, and unstable renal function would need to delay until more stable.  Stopping the following temporarily given concern for infection: losartan 25 mg daily, Jardiance 10 mg daily, spironolactone 12.5 mg daily.  Blood pressure has been normotensive to slightly soft.   Appears overall euvolemic however has abdominal distention and peripheral edema on his right side.  Renal function continues to decline and has elevated BUN.  Agree with holding diuresis. Will order complete echo   CAD status post CABG in 1992 Peripheral artery disease status post aortic bifemoral bypass grafting secondary to abdominal aortic aneurysm in 2005  See above, continue aspirin 81 mg daily.  AKI on CKD stage IIIa Renal function continues to decline.  Creatinine 1.79, GFR 38  Hyperlipidemia Continue Zetia 10 mg daily and simvastatin 40 mg daily.  Will  obtain lipid panel.  Mild aortic stenosis Continue to monitor.  Mean gradient 10.8  Altered mental status Leukocytosis (continues to trend up, 19k) R knee swelling/warmth Primary team has been made aware of patient's decline in mentation.  There is some concern of infection. I have informed primary team. Will order blood cultures. Defer further workup to primary team.  Urinalysis negative. Also borderline tachycardic.   Risk Assessment/Risk Scores:   New York Heart Association (NYHA) Functional Class NYHA Class III   For questions or updates, please contact [facility] Please consult [email or web address] for contact info under    Signed, Abagail Kitchens, PA-C  09/15/2022 2:31 PM   Agree  with note by Yvonna Alanis PA-c  Patient well-known to me having taken care of him since 1992.  He had bypass surgery at that time.  He has normal LV function with mild AS as well as peripheral arterial disease status post aortobifemoral bypass grafting.  He was admitted with vague symptoms.  He denies chest pain.  He has been somewhat weak over the last several months.  Echo performed in January showed normal LV function.  Echo performed during his hospitalization showed decline in LV function to 40 to 45% with question regional wall motion abnormalities.  His BNP is mildly mildly elevated.  Troponins are slightly elevated but flat.  His exam is benign except for a warm tender right knee.  His white count is up to 19,000 for unclear reasons.  His serum creatinine is up as well.  Will hold losartan, spironolactone and diuretics.  I think he needs a ID workup including blood cultures and potential aspiration of his right knee.  At this point, we will hold off on evaluating the etiology of his LV dysfunction.  Runell Gess, M.D., FACP, [facility], [facility] [address] [place], [place]  [address]  [phone] 09/15/2022 5:25 PM

## 2022-09-15 NOTE — Progress Notes (Addendum)
Cards PA concerned about pt mental status- pt a/o x4 upon orientation questions but intermittently confused. When in room to give meds, pt states he sees bugs flying around. Ella Jubilee, MD and [place], [place] made aware. Continuing to monitor pt closely. IV morphine q2 PRN added to help with pain control and will wait for CBC results to populate for further instructions.

## 2022-09-15 NOTE — Evaluation (Signed)
Physical Therapy Evaluation Patient Details Name: Johnny [name] MRN: [id number] DOB: 06/08/1945 Today's Date: 09/15/2022  History of Present Illness  Pt is a 77 y/o M presenting to ED on with hand and BLE edema, weakness, dyspnea with exertion,CXR revealing lower L atelectasiss, no pleural effusion or edema. Admitted for CHF exacerbation. PMH includes MI, PAD, aortic stenosis, CAD s/p CABG, HTN, AAA, hyperlipidemia, chronic pruritis/dermal hypersensitivity reaction, OSA.  Clinical Impression  Pt presents with admitting diagnosis above. Re evaluation due to sudden onset of R knee pain and change in mobility status. Pt was originally DC from PT on 5/19 however pt today required +2 Max A to stand with RW due to R knee pain. R knee appeared to be much more swollen today compared to previous session. Requested imaging from MD due to acuity of swelling and MD agreeable. Once standing pt was able to ambulate in hallway with RW Min G with a chair follow for safety. Based on current mobility today, pt would benefit from HHPT pending progress with a RW at home. PT will continue to follow.     Recommendations for follow up therapy are one component of a multi-disciplinary discharge planning process, led by the attending physician.  Recommendations may be updated based on patient status, additional functional criteria and insurance authorization.  Follow Up Recommendations       Assistance Recommended at Discharge Intermittent Supervision/Assistance  Patient can return home with the following  A lot of help with walking and/or transfers;A little help with bathing/dressing/bathroom;Assistance with cooking/housework;Direct supervision/assist for medications management;Help with stairs or ramp for entrance;Assist for transportation    Equipment Recommendations Rolling walker (2 wheels)  Recommendations for Other Services       Functional Status Assessment Patient has had a recent decline in their  functional status and demonstrates the ability to make significant improvements in function in a reasonable and predictable amount of time.     Precautions / Restrictions Precautions Precautions: Fall Precaution Comments: R knee buckling 5/20 Restrictions Weight Bearing Restrictions: No      Mobility  Bed Mobility Overal bed mobility: Modified Independent                  Transfers Overall transfer level: Needs assistance Equipment used: Rolling walker (2 wheels) Transfers: Sit to/from Stand Sit to Stand: +2 physical assistance, Max assist           General transfer comment: Attempt x2. Pt not able to put forth much effort on R side due to knee pain.    Ambulation/Gait Ambulation/Gait assistance: Min guard, +2 safety/equipment (Chair follow) Gait Distance (Feet): 200 Feet Assistive device: Rolling walker (2 wheels) Gait Pattern/deviations: Antalgic, Knees buckling, Knee flexed in stance - right, Decreased stride length, Step-through pattern Gait velocity: Decreased     General Gait Details: Couple of episodes of knee buckling noted however pt mostly able to self correct.  Stairs            Wheelchair Mobility    Modified Rankin (Stroke Patients Only)       Balance Overall balance assessment: Needs assistance Sitting-balance support: Feet supported Sitting balance-Leahy Scale: Good Sitting balance - Comments: reaches outside BOS without LOB   Standing balance support: Bilateral upper extremity supported, During functional activity, Reliant on assistive device for balance Standing balance-Leahy Scale: Poor Standing balance comment: benefits from RW for external support  Pertinent Vitals/Pain Pain Assessment Pain Assessment: Faces Faces Pain Scale: Hurts whole lot Pain Location: R knee Pain Descriptors / Indicators: Discomfort, Grimacing, Moaning Pain Intervention(s): Monitored during session,  Premedicated before session, Limited activity within patient's tolerance    Home Living Family/patient expects to be discharged to:: Private residence Living Arrangements: Spouse/significant other (Spouse has dementia) Available Help at Discharge: Family;Available PRN/intermittently Type of Home: House Home Access: Stairs to enter Entrance Stairs-Rails: None Entrance Stairs-Number of Steps: 1   Home Layout: One level Home Equipment: Grab bars - tub/shower;Hand held shower head Additional Comments: Re eval. Same info from 5/19    Prior Function Prior Level of Function : Independent/Modified Independent;Driving;History of Falls (last six months) (Pt reports tripping in dark room)             Mobility Comments: no AD use ADLs Comments: ind with ADLs     Hand Dominance   Dominant Hand: Right    Extremity/Trunk Assessment   Upper Extremity Assessment Upper Extremity Assessment: Overall WFL for tasks assessed    Lower Extremity Assessment Lower Extremity Assessment: RLE deficits/detail RLE Deficits / Details: Swollen R knee RLE: Unable to fully assess due to pain    Cervical / Trunk Assessment Cervical / Trunk Assessment: Normal  Communication   Communication: No difficulties  Cognition Arousal/Alertness: Awake/alert Behavior During Therapy: WFL for tasks assessed/performed Overall Cognitive Status: Within Functional Limits for tasks assessed                                 General Comments: aware of reason for being in the hospital, mild decreased awareness of deficits        General Comments General comments (skin integrity, edema, etc.): VSS on RA    Exercises     Assessment/Plan    PT Assessment Patient needs continued PT services  PT Problem List Decreased strength;Decreased range of motion;Decreased activity tolerance;Decreased balance;Pain;Decreased mobility;Decreased coordination;Decreased knowledge of use of DME;Decreased knowledge of  precautions;Decreased safety awareness       PT Treatment Interventions DME instruction;Gait training;Stair training;Functional mobility training;Therapeutic activities;Therapeutic exercise;Balance training;Neuromuscular re-education;Patient/family education    PT Goals (Current goals can be found in the Care Plan section)  Acute Rehab PT Goals Patient Stated Goal: to go home PT Goal Formulation: With patient Time For Goal Achievement: 09/29/22 Potential to Achieve Goals: Good    Frequency Min 1X/week     Co-evaluation               AM-PAC PT "6 Clicks" Mobility  Outcome Measure Help needed turning from your back to your side while in a flat bed without using bedrails?: None Help needed moving from lying on your back to sitting on the side of a flat bed without using bedrails?: None Help needed moving to and from a bed to a chair (including a wheelchair)?: A Lot Help needed standing up from a chair using your arms (e.g., wheelchair or bedside chair)?: Total Help needed to walk in hospital room?: A Lot Help needed climbing 3-5 steps with a railing? : Total 6 Click Score: 14    End of Session Equipment Utilized During Treatment: Gait belt Activity Tolerance: Patient tolerated treatment well;Patient limited by pain Patient left: in chair;with call bell/phone within reach;with chair alarm set Nurse Communication: Mobility status PT Visit Diagnosis: Other abnormalities of gait and mobility (R26.89)    Time: 1610-9604 PT Time Calculation (min) (ACUTE ONLY): 20 min  Charges:   PT Evaluation $PT Re-evaluation: 1 Re-eval PT Treatments $Gait Training: 8-22 mins        Shela Nevin, PT, DPT Acute Rehab Services [phone]   Gladys Damme 09/15/2022, 12:56 PM

## 2022-09-16 ENCOUNTER — Other Ambulatory Visit (HOSPITAL_COMMUNITY): Payer: Medicare Other

## 2022-09-16 ENCOUNTER — Other Ambulatory Visit (HOSPITAL_COMMUNITY): Payer: Self-pay

## 2022-09-16 ENCOUNTER — Telehealth (HOSPITAL_COMMUNITY): Payer: Self-pay | Admitting: Pharmacy Technician

## 2022-09-16 ENCOUNTER — Inpatient Hospital Stay (HOSPITAL_COMMUNITY): Payer: Medicare Other

## 2022-09-16 DIAGNOSIS — B9561 Methicillin susceptible Staphylococcus aureus infection as the cause of diseases classified elsewhere: Secondary | ICD-10-CM

## 2022-09-16 DIAGNOSIS — I2510 Atherosclerotic heart disease of native coronary artery without angina pectoris: Secondary | ICD-10-CM | POA: Diagnosis not present

## 2022-09-16 DIAGNOSIS — R7881 Bacteremia: Secondary | ICD-10-CM | POA: Diagnosis not present

## 2022-09-16 DIAGNOSIS — I10 Essential (primary) hypertension: Secondary | ICD-10-CM | POA: Diagnosis not present

## 2022-09-16 DIAGNOSIS — Z96651 Presence of right artificial knee joint: Secondary | ICD-10-CM | POA: Diagnosis not present

## 2022-09-16 DIAGNOSIS — M00061 Staphylococcal arthritis, right knee: Secondary | ICD-10-CM | POA: Diagnosis not present

## 2022-09-16 DIAGNOSIS — I5033 Acute on chronic diastolic (congestive) heart failure: Secondary | ICD-10-CM

## 2022-09-16 DIAGNOSIS — I739 Peripheral vascular disease, unspecified: Secondary | ICD-10-CM

## 2022-09-16 LAB — BLOOD CULTURE ID PANEL (REFLEXED) - BCID2

## 2022-09-16 LAB — LIPID PANEL
Cholesterol: 51 mg/dL (ref 0–200)
HDL: 14 mg/dL — ABNORMAL LOW (ref >40)
LDL Cholesterol: 21 mg/dL (ref 0–99)
Total CHOL/HDL Ratio: 3.6 RATIO
Triglycerides: 78 mg/dL (ref <150)
VLDL: 16 mg/dL (ref 0–40)

## 2022-09-16 LAB — ECHOCARDIOGRAM COMPLETE
AR max vel: 1.52 cm2
AV Area VTI: 1.53 cm2
AV Area mean vel: 1.46 cm2
AV Mean grad: 13 mmHg
AV Peak grad: 22.8 mmHg
Ao pk vel: 2.39 m/s
Area-P 1/2: 3.53 cm2
Height: 67 in
S' Lateral: 3.6 cm
Single Plane A2C EF: 48.6 %
Weight: 3129.6 oz

## 2022-09-16 LAB — CBC WITH DIFFERENTIAL/PLATELET
Abs Immature Granulocytes: 0.22 10*3/uL — ABNORMAL HIGH (ref 0.00–0.07)
Basophils Absolute: 0.1 10*3/uL (ref 0.0–0.1)
Basophils Relative: 0 %
Eosinophils Absolute: 0.1 10*3/uL (ref 0.0–0.5)
Eosinophils Relative: 1 %
HCT: 30.3 % — ABNORMAL LOW (ref 39.0–52.0)
Hemoglobin: 10 g/dL — ABNORMAL LOW (ref 13.0–17.0)
Immature Granulocytes: 1 %
Lymphocytes Relative: 5 %
Lymphs Abs: 0.9 10*3/uL (ref 0.7–4.0)
MCH: 32.9 pg (ref 26.0–34.0)
MCHC: 33 g/dL (ref 30.0–36.0)
MCV: 99.7 fL (ref 80.0–100.0)
Monocytes Absolute: 1.5 10*3/uL — ABNORMAL HIGH (ref 0.1–1.0)
Monocytes Relative: 8 %
Neutro Abs: 15.9 10*3/uL — ABNORMAL HIGH (ref 1.7–7.7)
Neutrophils Relative %: 85 %
Platelets: 364 10*3/uL (ref 150–400)
RBC: 3.04 MIL/uL — ABNORMAL LOW (ref 4.22–5.81)
RDW: 13.3 % (ref 11.5–15.5)
WBC: 18.7 10*3/uL — ABNORMAL HIGH (ref 4.0–10.5)
nRBC: 0 % (ref 0.0–0.2)

## 2022-09-16 LAB — GLUCOSE, CAPILLARY
Glucose-Capillary: 137 mg/dL — ABNORMAL HIGH (ref 70–99)
Glucose-Capillary: 155 mg/dL — ABNORMAL HIGH (ref 70–99)
Glucose-Capillary: 148 mg/dL — ABNORMAL HIGH (ref 70–99)
Glucose-Capillary: 158 mg/dL — ABNORMAL HIGH (ref 70–99)
Glucose-Capillary: 158 mg/dL — ABNORMAL HIGH (ref 70–99)
Glucose-Capillary: 139 mg/dL — ABNORMAL HIGH (ref 70–99)

## 2022-09-16 LAB — BASIC METABOLIC PANEL
Anion gap: 12 (ref 5–15)
BUN: 44 mg/dL — ABNORMAL HIGH (ref 8–23)
CO2: 27 mmol/L (ref 22–32)
Calcium: 8.7 mg/dL — ABNORMAL LOW (ref 8.9–10.3)
Chloride: 94 mmol/L — ABNORMAL LOW (ref 98–111)
Creatinine, Ser: 1.9 mg/dL — ABNORMAL HIGH (ref 0.61–1.24)
GFR, Estimated: 36 mL/min — ABNORMAL LOW (ref >60)
Glucose, Bld: 142 mg/dL — ABNORMAL HIGH (ref 70–99)
Potassium: 3.7 mmol/L (ref 3.5–5.1)
Sodium: 133 mmol/L — ABNORMAL LOW (ref 135–145)

## 2022-09-16 LAB — SYNOVIAL CELL COUNT + DIFF, W/ CRYSTALS
Eosinophils-Synovial: 0 % (ref 0–1)
Lymphocytes-Synovial Fld: 10 % (ref 0–20)
Monocyte-Macrophage-Synovial Fluid: 2 % — ABNORMAL LOW (ref 50–90)
Neutrophil, Synovial: 88 % — ABNORMAL HIGH (ref 0–25)
WBC, Synovial: 96500 /mm3 — ABNORMAL HIGH (ref 0–200)

## 2022-09-16 LAB — CULTURE, BLOOD (ROUTINE X 2): Special Requests: ADEQUATE

## 2022-09-16 LAB — BODY FLUID CULTURE W GRAM STAIN

## 2022-09-16 MED ORDER — SODIUM CHLORIDE 0.9 % IV SOLN: INTRAVENOUS | Status: DC

## 2022-09-16 MED ORDER — CEFAZOLIN SODIUM-DEXTROSE 2-4 GM/100ML-% IV SOLN
2.0000 g | Freq: Three times a day (TID) | INTRAVENOUS | Status: DC
  Administered 2022-09-16 – 2022-09-18 (×7): 2 g via INTRAVENOUS
  Filled 2022-09-16 (×7): qty 100

## 2022-09-16 MED ORDER — BUPIVACAINE HCL (PF) 0.5 % IJ SOLN
10.0000 mL | Freq: Once | INTRAMUSCULAR | Status: AC
  Administered 2022-09-16: 10 mL
  Filled 2022-09-16: qty 10

## 2022-09-16 NOTE — Progress Notes (Addendum)
Occupational Therapy Treatment Patient Details Name: Johnny [name] MRN: [id number] DOB: February 23, 1946 Today's Date: 09/16/2022   History of present illness Pt is a 77 y/o M presenting to ED on with hand and BLE edema, weakness, dyspnea with exertion,CXR revealing lower L atelectasiss, no pleural effusion or edema. Admitted for CHF exacerbation. R knee pain 5/20 found to have R knee effusion, s/p aspiration 5/22. PMH includes MI, PAD, aortic stenosis, CAD s/p CABG, HTN, AAA, hyperlipidemia, chronic pruritis/dermal hypersensitivity reaction, OSA.   OT comments  Pt making slow progress towards goals this session, decreased awareness of safety noted, pt needing cues for hand placement and to keep RW close to body as pt abandoning RW with transferring to chair. Pt min A for ADLs (LB dressing and toilet transfer, and min guard for standing grooming task). Pt initially needing mod  A +2 for standing from EOB, increased time steadying self prior to transfers/mobility. Pt presenting with impairments listed below, will follow acutely. Continue to recommend HHOT at d/c pending progression.   Recommendations for follow up therapy are one component of a multi-disciplinary discharge planning process, led by the attending physician.  Recommendations may be updated based on patient status, additional functional criteria and insurance authorization.    Assistance Recommended at Discharge Intermittent Supervision/Assistance  Patient can return home with the following  A little help with walking and/or transfers;A little help with bathing/dressing/bathroom;Assistance with cooking/housework;Assist for transportation;Help with stairs or ramp for entrance   Equipment Recommendations  Tub/shower seat;Other (comment) (RW)    Recommendations for Other Services PT consult    Precautions / Restrictions Precautions Precautions: Fall  Precaution Comments: no WB or motion restrictions per ortho note  5/22 Restrictions Weight Bearing Restrictions: No        Mobility Bed Mobility               General bed mobility comments: seated EOB upon arrival, left up in chair   Transfers Overall transfer level: Needs assistance  Equipment used: Rolling walker (2 wheels)  Transfers: Sit to/from Stand  Sit to Stand: Mod assist, +2 physical assistance            General transfer comment: mod A +2 for initial stand from EOB, increased time to steady self as pt keeps trunk flexed, cues for safety/hand placement.      Balance Overall balance assessment: Needs assistance  Sitting-balance support: Feet supported  Sitting balance-Leahy Scale: Good  Sitting balance - Comments: reaches outside BOS without LOB   Standing balance support: Bilateral upper extremity supported, During functional activity, Reliant on assistive device for balance  Standing balance-Leahy Scale: Poor  Standing balance comment: benefits from RW for external support                          ADL either performed or assessed with clinical judgement   ADL Overall ADL's : Needs assistance/impaired     Grooming: Min guard;Standing;Brushing hair               Lower Body Dressing: Minimal assistance;Sitting/lateral leans   Toilet Transfer: Minimal assistance Toilet Transfer Details (indicate cue type and reason): seated use of urinal Toileting- Clothing Manipulation and Hygiene: Minimal assistance;Sitting/lateral lean       Functional mobility during ADLs: Min guard;Rolling walker (2 wheels)      Extremity/Trunk Assessment Upper Extremity Assessment Upper Extremity Assessment: Generalized weakness   Lower Extremity Assessment Lower Extremity Assessment: Defer to PT evaluation  Vision   Vision Assessment?: No apparent visual deficits   Perception Perception Perception: Not tested   Praxis Praxis Praxis: Not tested    Cognition Arousal/Alertness:  (User may not have seen  previous data.) Behavior During Therapy: Impulsive, Anxious Overall Cognitive Status: Impaired/Different from baseline Area of Impairment: Following commands, Safety/judgement, Awareness                       Following Commands: Follows one step commands inconsistently, Follows multi-step commands inconsistently Safety/Judgement: Decreased awareness of safety Awareness: Emergent   General Comments: Pt with decreased safety awareness, attempting to abandon RW with transfer to chair, decreased awareness of deficits        Exercises      Shoulder Instructions       General Comments VSS on RA     Pertinent Vitals/ Pain       Pain Assessment Faces Pain Scale: Hurts whole lot Pain Location: R knee Pain Descriptors / Indicators: Discomfort, Grimacing, Moaning  Home Living                                          Prior Functioning/Environment              Frequency  Min 1X/week        Progress Toward Goals  OT Goals(current goals can now be found in the care plan section)  Progress towards OT goals: Progressing toward goals  Acute Rehab OT Goals Patient Stated Goal: none stated OT Goal Formulation: With patient Time For Goal Achievement: 09/27/22 Potential to Achieve Goals: Good ADL Goals Pt Will Perform Upper Body Dressing: with supervision;sitting Pt Will Perform Lower Body Dressing: with supervision;sitting/lateral leans;sit to/from stand Pt Will Transfer to Toilet: with supervision;ambulating;regular height toilet Pt Will Perform Tub/Shower Transfer: Tub transfer;Shower transfer;with supervision;ambulating;shower seat Additional ADL Goal #1: pt will verbalize 3 energy conservation strategies in prep for ADLs  Plan Frequency remains appropriate;Discharge plan needs to be updated    Co-evaluation                 AM-PAC OT "6 Clicks" Daily Activity     Outcome Measure   Help from another person eating meals?: None Help  from another person taking care of personal grooming?: A Little Help from another person toileting, which includes using toliet, bedpan, or urinal?: A Little Help from another person bathing (including washing, rinsing, drying)?: A Little Help from another person to put on and taking off regular upper body clothing?: A Little Help from another person to put on and taking off regular lower body clothing?: A Little 6 Click Score: 19    End of Session Equipment Utilized During Treatment: Gait belt;Rolling walker (2 wheels)  OT Visit Diagnosis: Unsteadiness on feet (R26.81);Other abnormalities of gait and mobility (R26.89);Muscle weakness (generalized) (M62.81);History of falling (Z91.81)   Activity Tolerance Patient tolerated treatment well   Patient Left in chair;with chair alarm set;with call bell/phone within reach   Nurse Communication Mobility status        Time: 6578-4696 OT Time Calculation (min): 25 min  Charges: OT General Charges $OT Visit: 1 Visit OT Treatments $Self Care/Home Management : 8-22 mins  Carver Fila, OTD, OTR/L [employer]Chat Preferred Acute Rehab [phone]   Carver Fila Koonce 09/16/2022, 5:06 PM

## 2022-09-16 NOTE — Telephone Encounter (Signed)
Pharmacy Patient Advocate Encounter  Insurance verification completed.    The patient is insured through [employer] Medicare Part D   The patient is currently admitted and ran test claims for the following: Entresto, Farxiga, Jardiance.  Copays and coinsurance results were relayed to Inpatient clinical team.  

## 2022-09-16 NOTE — Care Management Important Message (Signed)
Important Message  Patient Details  Name: Johnny [name] MRN: [id number] Date of Birth: [birth year]   Medicare Important Message Given:  Yes     Sherilyn Banker 09/16/2022, 12:08 PM

## 2022-09-16 NOTE — Progress Notes (Signed)
Physical Therapy Treatment Patient Details Name: Johnny [name] MRN: [id number] DOB: 05-12-1945 Today's Date: 09/16/2022   History of Present Illness Pt is a 77 y/o M presenting to ED on with hand and BLE edema, weakness, dyspnea with exertion,CXR revealing lower L atelectasiss, no pleural effusion or edema. Admitted for CHF exacerbation. PMH includes MI, PAD, aortic stenosis, CAD s/p CABG, HTN, AAA, hyperlipidemia, chronic pruritis/dermal hypersensitivity reaction, OSA. (Simultaneous filing. User may not have seen previous data.)    PT Comments    Pt with fair tolerance to treatment today. Co treat with OT. Pt able to ambulate in hallway with RW Min G with a chair however continues to be limited by pain. Pt also noted with continued difficulty on sit to stand transfers requiring +2 Mod A. Pt also appears to have some cognitive decline today with noted difficulty following commands and slightly impulsive behavior with decreased safety awareness. No change in DC/DME recs at this time. PT will continue to follow.  Recommendations for follow up therapy are one component of a multi-disciplinary discharge planning process, led by the attending physician.  Recommendations may be updated based on patient status, additional functional criteria and insurance authorization.  Follow Up Recommendations       Assistance Recommended at Discharge Intermittent Supervision/Assistance  Patient can return home with the following A lot of help with walking and/or transfers;A little help with bathing/dressing/bathroom;Assistance with cooking/housework;Direct supervision/assist for medications management;Help with stairs or ramp for entrance;Assist for transportation   Equipment Recommendations  Rolling walker (2 wheels)    Recommendations for Other Services       Precautions / Restrictions Precautions Precautions: Fall (Simultaneous filing. User may not have seen previous data.) Precaution Comments: R knee  buckling 5/20 Restrictions Weight Bearing Restrictions: No (Simultaneous filing. User may not have seen previous data.)     Mobility  Bed Mobility                    Transfers                        Ambulation/Gait Ambulation/Gait assistance: Min guard, +2 safety/equipment (Chair follow) Gait Distance (Feet): 200 Feet Assistive device: Rolling walker (2 wheels) Gait Pattern/deviations: Antalgic, Knee flexed in stance - right, Decreased stride length, Step-through pattern Gait velocity: Decreased     General Gait Details: Pt with steady antalgic gait. no LOB or knee buckling noted today however gait speed much slower compared to previous sessions.   Stairs             Wheelchair Mobility    Modified Rankin (Stroke Patients Only)       Balance       Sitting balance - Comments: reaches outside BOS without LOB                                    Cognition Arousal/Alertness: Awake/alert (Simultaneous filing. User may not have seen previous data.) Behavior During Therapy: Impulsive, Anxious (Simultaneous filing. User may not have seen previous data.) Overall Cognitive Status: Impaired/Different from baseline (Simultaneous filing. User may not have seen previous data.) Area of Impairment: Following commands, Safety/judgement, Awareness (Simultaneous filing. User may not have seen previous data.)                       Following Commands: Follows one step commands inconsistently, Follows multi-step commands inconsistently  Safety/Judgement: Decreased awareness of safety (Simultaneous filing. User may not have seen previous data.) Awareness: Emergent (Simultaneous filing. User may not have seen previous data.)   General Comments: Pt today appeared to have cognitive decline demonstrating difficulty following commands, decreased safety awareness, and slight impulsivity. Per RN pt has been intermittently confused. (Simultaneous  filing. User may not have seen previous data.)        Exercises      General Comments General comments (skin integrity, edema, etc.): VSS on RA (Simultaneous filing. User may not have seen previous data.)      Pertinent Vitals/Pain Pain Assessment Pain Assessment: 0-10 (Simultaneous filing. User may not have seen previous data.) Pain Score: 8  (Simultaneous filing. User may not have seen previous data.) Pain Location: R knee (Simultaneous filing. User may not have seen previous data.) Pain Descriptors / Indicators: Discomfort, Grimacing, Moaning (Simultaneous filing. User may not have seen previous data.) Pain Intervention(s): Monitored during session, Limited activity within patient's tolerance, Patient requesting pain meds-RN notified (Simultaneous filing. User may not have seen previous data.)    Home Living                          Prior Function            PT Goals (current goals can now be found in the care plan section) Progress towards PT goals: Progressing toward goals    Frequency    Min 1X/week      PT Plan Current plan remains appropriate    Co-evaluation              AM-PAC PT "6 Clicks" Mobility   Outcome Measure  Help needed turning from your back to your side while in a flat bed without using bedrails?: None Help needed moving from lying on your back to sitting on the side of a flat bed without using bedrails?: None Help needed moving to and from a bed to a chair (including a wheelchair)?: A Lot Help needed standing up from a chair using your arms (e.g., wheelchair or bedside chair)?: A Lot Help needed to walk in hospital room?: A Lot Help needed climbing 3-5 steps with a railing? : Total 6 Click Score: 15    End of Session Equipment Utilized During Treatment: Gait belt Activity Tolerance: Patient tolerated treatment well;Patient limited by pain Patient left: in chair;with call bell/phone within reach;with chair alarm set Nurse  Communication: Mobility status PT Visit Diagnosis: Other abnormalities of gait and mobility (R26.89)     Time: 1500-1526 PT Time Calculation (min) (ACUTE ONLY): 26 min  Charges:  $Gait Training: 8-22 mins                     Shela Nevin, PT, DPT Acute Rehab Services [phone]    Gladys Damme 09/16/2022, 4:17 PM

## 2022-09-16 NOTE — Procedures (Signed)
Procedure: Right knee aspiration and injection   Indication: Right knee effusion(s)   Surgeon: Charma Igo, PA-C   Assist: None   Anesthesia: Topical refrigerant   EBL: None   Complications: None   Findings: After risks/benefits explained patient desires to undergo procedure. Consent obtained and time out performed. The right knee was sterilely prepped and aspirated. 24ml opaque whitish yellow fluid obtained. 6ml 0.5% Marcaine instilled. Pt tolerated the procedure well.       Freeman Caldron, PA-C Orthopedic Surgery [phone]

## 2022-09-16 NOTE — Consult Note (Signed)
[facility]    Date of Admission:  09/12/2022     Total days of antibiotics 1   Ceftriaxone 5/21               Reason for Consult: MSSA bacteremia    Referring Provider: Connye Burkitt Primary Care Provider: Daisy Floro, MD   Assessment: Johnny [name] is a 77 y.o. male with chronic pruritus, HLD, HTN, CAD s/p CABG and AAA s/p open repair, Rt TKR who presented with subacute   MSSA Bacteremia -  -initially suspected hospital acquired bacteremia, however I have concerns this is actually community acquired and work up was delayed getting blood cultures. He describes some more subacute subjective infectious symptoms (chills/sweating) that have started more recently. -No obvious source at this time -continue to investigate for metastatic infectious sites  -Antibiotics: cefazolin  -Repeat blood cultures in AM to ensure clearance -TTE --> will go ahead and request TEE from cards team -Trauma to left ring finger with swelling and ring removal - TTP noted. Would check NCCT left hand.    Right TKR -  -Has had some reports of swelling/pain during hospital stay. -Concerning complaints in the setting of unexplained MSSA bacteremia  -D/W Dr.Arrien and Dr. Despina Hick will be consulted to investigate further work up.    Vascular Access -  -please hold on central line placement until bacteremia is cleared.    Plan: Change antibiotics to Cefazolin  Follow Orthopedic Recs of Rt TKR NCCT of Left hand with ring trauma and pain  TTE --> if negative will need TEE given likely community onset    Principal Problem:   Acute on chronic diastolic CHF (congestive heart failure) (HCC) Active Problems:   Coronary artery disease   Essential hypertension   Hyperlipidemia   Obstructive sleep apnea   Peripheral arterial disease (HCC)   Class 1 obesity   Leukocytosis   CKD stage 3a, GFR 45-59 ml/min (HCC)   Malnutrition of moderate degree    aspirin EC  81  mg Oral Daily   diclofenac Sodium  4 g Topical QID   ezetimibe  10 mg Oral Daily   famotidine  40 mg Oral Daily   feeding supplement  237 mL Oral TID BM   insulin aspart  0-5 Units Subcutaneous QHS   insulin aspart  0-9 Units Subcutaneous TID WC   multivitamin with minerals  1 tablet Oral Daily   simvastatin  40 mg Oral Daily    HPI: Johnny [name] is a 77 y.o. male admitted to the hospital from home due to shortness of breath (though he mentions he was itchy and came for that).   PMHx: CAD s/p CABG x 4 in 2005, AAA s/p repair ~1999, PVD, HTN, HLD, chronic pruritus, OSA, obesity.   Records in ER state he had report of shortness of breath in the ER a few weeks/month and peripheral edema bilaterally. Noted to have low grade fever and leukocytosis on presentation - U/A was only infectious work up collected at the time which was bland. Blood cultures were drawn on 5/21 when he had worsening leukocytosis to 18K revealing GPCs in all 4 bottles collected.   Had a band/ring on left hand that they could not remove d/t swelling that required being cut off. He has had several outpatient procedures that required phlebotomies and IVs to complete recently. He reports some "inward turning" of his right knee but nothing unremarkable with TKR -  his nurse explains today that she has cared for him several days and he had initial temp/leukocytosis when he came to hospital and last few days had swelling/tenderness and altered ROM to the right knee notably on her exam. Today he seemed to get up and mobilize well per therapy's report. XRay unrevealing.  Johnny [name] mentions several week history of dysregulated temperatures when he wakes up sweating / very hot.  He had a low grade fever 100.6 and leukocytosis on presentation to the hospital.  Chronic skin scratching for pruritus and that's what he focuses much of the conversation about today but no new concerns for back pain. No valves/cardiac devices. No wounds or  scratches that are concerning on exam. Has had multiple imaging studies recently requiring needle sticks and IV placements; no site is in particularly painful.    Review of Systems: Review of Systems  Constitutional:  Positive for chills, diaphoresis and fever.  Respiratory:  Positive for shortness of breath.   Cardiovascular:  Positive for leg swelling. Negative for chest pain and palpitations.  Genitourinary: Negative.   Musculoskeletal:        Right knee discomfort/swelling described yesterday   Skin:  Positive for itching.     Past Medical History:  Diagnosis Date   Abdominal aortic aneurysm [facility])    Cardiac murmur    Coronary artery disease    Heart attack (HCC)    Hemorrhoids    Occasional bleeding    Hiatal hernia    History of myocardial infarction 1992   History of rheumatic fever    Hyperlipidemia    Hypertension    Obstructive sleep apnea    Osteoarthritis of knee    Right knee   Peripheral arterial disease (HCC)    status post aortobifemoral bypass grafting by Dr. Hart Rochester in 2005   Sleep apnea     Social History   Tobacco Use   Smoking status: Former    Types: Cigarettes    Quit date: 08/26/1990    Years since quitting: 32.0    Passive exposure: Never   Smokeless tobacco: Never  Vaping Use   Vaping Use: Never used  Substance Use Topics   Alcohol use: Yes    Comment: occassional    Drug use: No    Family History  Problem Relation Age of Onset   Dementia Mother    Dementia Father    Allergies  Allergen Reactions   Statins Other (See Comments)    Intolerance     OBJECTIVE: Blood pressure 98/69, pulse 78, temperature 97.8 F (36.6 C), temperature source Oral, resp. rate 16, height 5\' 7"  (1.702 m), weight 88.7 kg, SpO2 95 %.   Physical Exam Constitutional:      Appearance: He is well-developed. He is not ill-appearing.  Cardiovascular:     Rate and Rhythm: Normal rate.     Heart sounds: No murmur heard. Pulmonary:     Effort: Pulmonary  effort is normal.     Breath sounds: Normal breath sounds. No wheezing, rhonchi or rales.  Abdominal:     General: There is no distension.     Palpations: Abdomen is soft.  Musculoskeletal:       Legs:     Comments: Well healed surgical incision over Rt knee. Today no significant effusion or tenderness on exam. Did not put through ROM exercises.   Skin:    Comments: Multiple bruising sites noted w/o TTP Current IV site w/o any tenderness or cording.  Neurological:  Mental Status: He is alert.     Lab Results Lab Results  Component Value Date   WBC 18.7 (H) 09/16/2022   HGB 10.0 (L) 09/16/2022   HCT 30.3 (L) 09/16/2022   MCV 99.7 09/16/2022   PLT 364 09/16/2022    Lab Results  Component Value Date   CREATININE 1.90 (H) 09/16/2022   BUN 44 (H) 09/16/2022   NA 133 (L) 09/16/2022   K 3.7 09/16/2022   CL 94 (L) 09/16/2022   CO2 27 09/16/2022    Lab Results  Component Value Date   ALT 25 09/12/2022   AST 31 09/12/2022   ALKPHOS 81 09/12/2022   BILITOT 0.9 09/12/2022     Microbiology: Recent Results (from the past 240 hour(s))  SARS Coronavirus 2 by RT PCR (hospital order, performed in [facility] hospital lab) *cepheid single result test* Anterior Nasal Swab     Status: None   Collection Time: 09/12/22  2:14 PM   Specimen: Anterior Nasal Swab  Result Value Ref Range Status   SARS Coronavirus 2 by RT PCR NEGATIVE NEGATIVE Final    Comment: (NOTE) SARS-CoV-2 target nucleic acids are NOT DETECTED.  The SARS-CoV-2 RNA is generally detectable in upper and lower respiratory specimens during the acute phase of infection. The lowest concentration of SARS-CoV-2 viral copies this assay can detect is 250 copies / mL. A negative result does not preclude SARS-CoV-2 infection and should not be used as the sole basis for treatment or other patient management decisions.  A negative result may occur with improper specimen collection / handling, submission of specimen  other than nasopharyngeal swab, presence of viral mutation(s) within the areas targeted by this assay, and inadequate number of viral copies (<250 copies / mL). A negative result must be combined with clinical observations, patient history, and epidemiological information.  Fact Sheet for Patients:   RoadLapTop.co.za  Fact Sheet for Healthcare Providers: http://kim-miller.com/  This test is not yet approved or  cleared by the [place] FDA and has been authorized for detection and/or diagnosis of SARS-CoV-2 by FDA under an Emergency Use Authorization (EUA).  This EUA will remain in effect (meaning this test can be used) for the duration of the COVID-19 declaration under Section 564(b)(1) of the Act, 21 U.S.C. section 360bbb-3(b)(1), unless the authorization is terminated or revoked sooner.  Performed at [facility], [address] [place], [place] [address]   MRSA Next Gen by PCR, Nasal     Status: None   Collection Time: 09/13/22  5:15 AM   Specimen: Nasal Mucosa; Nasal Swab  Result Value Ref Range Status   MRSA by PCR Next Gen NOT DETECTED NOT DETECTED Final    Comment: (NOTE) The GeneXpert MRSA Assay (FDA approved for NASAL specimens only), is one component of a comprehensive MRSA colonization surveillance program. It is not intended to diagnose MRSA infection nor to guide or monitor treatment for MRSA infections. Test performance is not FDA approved in patients less than 66 years old. Performed at [facility], [address], [place], [place] [address]   Culture, blood (Routine X 2) w Reflex to ID Panel     Status: None (Preliminary result)   Collection Time: 09/15/22  5:55 PM   Specimen: BLOOD RIGHT ARM  Result Value Ref Range Status   Specimen Description BLOOD RIGHT ARM  Final   Special Requests   Final    BOTTLES DRAWN AEROBIC AND ANAEROBIC Blood Culture adequate volume  Culture  Setup Time    Final    GRAM POSITIVE COCCI IN CLUSTERS IN BOTH AEROBIC AND ANAEROBIC BOTTLES Performed at [facility], [address], [place], [place] [address]    Culture GRAM POSITIVE COCCI  Final   Report Status PENDING  Incomplete  Culture, blood (Routine X 2) w Reflex to ID Panel     Status: None (Preliminary result)   Collection Time: 09/15/22  5:55 PM   Specimen: BLOOD RIGHT ARM  Result Value Ref Range Status   Specimen Description BLOOD RIGHT ARM  Final   Special Requests   Final    BOTTLES DRAWN AEROBIC AND ANAEROBIC Blood Culture adequate volume   Culture  Setup Time   Final    GRAM POSITIVE COCCI IN CLUSTERS IN BOTH AEROBIC AND ANAEROBIC BOTTLES CRITICAL RESULT CALLED TO, READ BACK BY AND VERIFIED WITH: A. PAYTES PHARMD, AT 1308 09/16/22 Renato Shin Performed at [facility], [address], [place], [place] [address]    Culture GRAM POSITIVE COCCI  Final   Report Status PENDING  Incomplete     Rexene Alberts, MSN, NP-C [facility]  [email or web address] Pager: [phone] Office: [phone] [facility] Main Line: [phone] *Secure Chat Communication Welcome   Total Encounter Time: 60 min

## 2022-09-16 NOTE — TOC Benefit Eligibility Note (Signed)
Patient Advocate Encounter  Insurance verification completed.    The patient is currently admitted and upon discharge could be taking Entresto 24-26 mg.  The current 30 day co-pay is $47.00.   The patient is currently admitted and upon discharge could be taking Farxiga 10 mg.  The current 30 day co-pay is $47.00.   The patient is currently admitted and upon discharge could be taking Jardiance 10 mg.  The current 30 day co-pay is $47.00.   The patient is insured through [employer] Medicare Part D   This test claim was processed through [facility]- copay amounts may vary at other pharmacies due to pharmacy/plan contracts, or as the patient moves through the different stages of their insurance plan.  Dariusz Brase, CPHT Pharmacy Patient Advocate Specialist [facility] Patient Advocate Team Direct Number: [phone]  Fax: [phone]       

## 2022-09-16 NOTE — Consult Note (Signed)
Reason for Consult:Right knee effusion Referring Physician: Delrae Sawyers Arrien Time called: 1425 Time at bedside: 1428   Johnny [name] is an 77 y.o. male.  HPI: [place] was admitted 4d ago with bacteremia and CHF. On hospital day #2 he began to have right knee pain that has steadily gotten worse but is not severe. He denies prior similar e/o with this knee though his left knee was painful and swollen in the last few weeks. This resolved on its own. Orthopedic surgery was consulted for possible septic joint.  Past Medical History:  Diagnosis Date   Abdominal aortic aneurysm [facility])    Cardiac murmur    Coronary artery disease    Heart attack (HCC)    Hemorrhoids    Occasional bleeding    Hiatal hernia    History of myocardial infarction 1992   History of rheumatic fever    Hyperlipidemia    Hypertension    Obstructive sleep apnea    Osteoarthritis of knee    Right knee   Peripheral arterial disease (HCC)    status post aortobifemoral bypass grafting by Dr. Hart Rochester in 2005   Sleep apnea     Past Surgical History:  Procedure Laterality Date   ABDOMINAL AORTIC ANEURYSM REPAIR  2005   CARDIAC CATHETERIZATION  1992   followed by open heart coronary artery bypass grafting of 4 vessels post MI   CHOLECYSTECTOMY     CORONARY ARTERY BYPASS GRAFT     REPLACEMENT TOTAL KNEE     Right knee cartilage surgery in 1976    Family History  Problem Relation Age of Onset   Dementia Mother    Dementia Father     Social History:  reports that he quit smoking about 32 years ago. His smoking use included cigarettes. He has never been exposed to tobacco smoke. He has never used smokeless tobacco. He reports current alcohol use. He reports that he does not use drugs.  Allergies:  Allergies  Allergen Reactions   Statins Other (See Comments)    Intolerance     Medications: I have reviewed the patient's current medications.  Results for orders placed or performed during the hospital encounter  of 09/12/22 (from the past 48 hour(s))  Glucose, capillary     Status: Abnormal   Collection Time: 09/14/22  4:29 PM  Result Value Ref Range   Glucose-Capillary 150 (H) 70 - 99 mg/dL    Comment: Glucose reference range applies only to samples taken after fasting for at least 8 hours.  Glucose, capillary     Status: Abnormal   Collection Time: 09/14/22  9:18 PM  Result Value Ref Range   Glucose-Capillary 124 (H) 70 - 99 mg/dL    Comment: Glucose reference range applies only to samples taken after fasting for at least 8 hours.   Comment 1 QC Due   Glucose, capillary     Status: Abnormal   Collection Time: 09/14/22  9:49 PM  Result Value Ref Range   Glucose-Capillary 131 (H) 70 - 99 mg/dL    Comment: Glucose reference range applies only to samples taken after fasting for at least 8 hours.  Basic metabolic panel     Status: Abnormal   Collection Time: 09/15/22  2:28 AM  Result Value Ref Range   Sodium 133 (L) 135 - 145 mmol/L   Potassium 3.7 3.5 - 5.1 mmol/L   Chloride 93 (L) 98 - 111 mmol/L   CO2 25 22 - 32 mmol/L   Glucose, Bld 160 (  H) 70 - 99 mg/dL    Comment: Glucose reference range applies only to samples taken after fasting for at least 8 hours.   BUN 31 (H) 8 - 23 mg/dL   Creatinine, Ser 4.40 (H) 0.61 - 1.24 mg/dL   Calcium 8.7 (L) 8.9 - 10.3 mg/dL   GFR, Estimated 38 (L) >60 mL/min    Comment: (NOTE) Calculated using the CKD-EPI Creatinine Equation (2021)    Anion gap 15 5 - 15    Comment: Performed at [facility], [address], [place], [place] [address]  Glucose, capillary     Status: Abnormal   Collection Time: 09/15/22  7:54 AM  Result Value Ref Range   Glucose-Capillary 128 (H) 70 - 99 mg/dL    Comment: Glucose reference range applies only to samples taken after fasting for at least 8 hours.  Glucose, capillary     Status: Abnormal   Collection Time: 09/15/22 12:11 PM  Result Value Ref Range   Glucose-Capillary 118 (H) 70 - 99 mg/dL    Comment: Glucose  reference range applies only to samples taken after fasting for at least 8 hours.  CBC with Differential/Platelet     Status: Abnormal   Collection Time: 09/15/22  3:43 PM  Result Value Ref Range   WBC 19.2 (H) 4.0 - 10.5 K/uL   RBC 3.21 (L) 4.22 - 5.81 MIL/uL   Hemoglobin 10.6 (L) 13.0 - 17.0 g/dL   HCT 59.5 (L) 63.8 - 75.6 %   MCV 100.3 (H) 80.0 - 100.0 fL   MCH 33.0 26.0 - 34.0 pg   MCHC 32.9 30.0 - 36.0 g/dL   RDW 43.3 29.5 - 18.8 %   Platelets 377 150 - 400 K/uL   nRBC 0.0 0.0 - 0.2 %   Neutrophils Relative % 88 %   Neutro Abs 16.9 (H) 1.7 - 7.7 K/uL   Lymphocytes Relative 3 %   Lymphs Abs 0.6 (L) 0.7 - 4.0 K/uL   Monocytes Relative 8 %   Monocytes Absolute 1.5 (H) 0.1 - 1.0 K/uL   Eosinophils Relative 0 %   Eosinophils Absolute 0.1 0.0 - 0.5 K/uL   Basophils Relative 0 %   Basophils Absolute 0.0 0.0 - 0.1 K/uL   Immature Granulocytes 1 %   Abs Immature Granulocytes 0.19 (H) 0.00 - 0.07 K/uL    Comment: Performed at [facility], [address], [place], [place] [address]  Glucose, capillary     Status: Abnormal   Collection Time: 09/15/22  4:07 PM  Result Value Ref Range   Glucose-Capillary 187 (H) 70 - 99 mg/dL    Comment: Glucose reference range applies only to samples taken after fasting for at least 8 hours.  Culture, blood (Routine X 2) w Reflex to ID Panel     Status: None (Preliminary result)   Collection Time: 09/15/22  5:55 PM   Specimen: BLOOD RIGHT ARM  Result Value Ref Range   Specimen Description BLOOD RIGHT ARM    Special Requests      BOTTLES DRAWN AEROBIC AND ANAEROBIC Blood Culture adequate volume   Culture  Setup Time      GRAM POSITIVE COCCI IN CLUSTERS IN BOTH AEROBIC AND ANAEROBIC BOTTLES Performed at [facility], [address] [place], [place] [address]    Culture GRAM POSITIVE COCCI    Report Status PENDING   Culture, blood (Routine X 2) w Reflex to ID Panel     Status: None (Preliminary  result)   Collection Time: 09/15/22   5:55 PM   Specimen: BLOOD RIGHT ARM  Result Value Ref Range   Specimen Description BLOOD RIGHT ARM    Special Requests      BOTTLES DRAWN AEROBIC AND ANAEROBIC Blood Culture adequate volume   Culture  Setup Time      GRAM POSITIVE COCCI IN CLUSTERS IN BOTH AEROBIC AND ANAEROBIC BOTTLES CRITICAL RESULT CALLED TO, READ BACK BY AND VERIFIED WITH: A. PAYTES PHARMD, AT 1610 09/16/22 Renato Shin Performed at [facility], [address] [place], [place] [address]    Culture GRAM POSITIVE COCCI    Report Status PENDING   Blood Culture ID Panel (Reflexed)     Status: Abnormal   Collection Time: 09/15/22  5:55 PM  Result Value Ref Range   Enterococcus faecalis NOT DETECTED NOT DETECTED   Enterococcus Faecium NOT DETECTED NOT DETECTED   Listeria monocytogenes NOT DETECTED NOT DETECTED   Staphylococcus species DETECTED (A) NOT DETECTED    Comment: CRITICAL RESULT CALLED TO, READ BACK BY AND VERIFIED WITH: A. PAYTES PHARMD, AT 4098 09/16/22 D. VANHOOK    Staphylococcus aureus (BCID) DETECTED (A) NOT DETECTED    Comment: CRITICAL RESULT CALLED TO, READ BACK BY AND VERIFIED WITH: A. PAYTES PHARMD, AT 1191 09/16/22 D. VANHOOK    Staphylococcus epidermidis NOT DETECTED NOT DETECTED   Staphylococcus lugdunensis NOT DETECTED NOT DETECTED   Streptococcus species NOT DETECTED NOT DETECTED   Streptococcus agalactiae NOT DETECTED NOT DETECTED   Streptococcus pneumoniae NOT DETECTED NOT DETECTED   Streptococcus pyogenes NOT DETECTED NOT DETECTED   A.calcoaceticus-baumannii NOT DETECTED NOT DETECTED   Bacteroides fragilis NOT DETECTED NOT DETECTED   Enterobacterales NOT DETECTED NOT DETECTED   Enterobacter cloacae complex NOT DETECTED NOT DETECTED   Escherichia coli NOT DETECTED NOT DETECTED   Klebsiella aerogenes NOT DETECTED NOT DETECTED   Klebsiella oxytoca NOT DETECTED NOT DETECTED   Klebsiella pneumoniae NOT DETECTED NOT DETECTED   Proteus species NOT DETECTED NOT DETECTED   Salmonella  species NOT DETECTED NOT DETECTED   Serratia marcescens NOT DETECTED NOT DETECTED   Haemophilus influenzae NOT DETECTED NOT DETECTED   Neisseria meningitidis NOT DETECTED NOT DETECTED   Pseudomonas aeruginosa NOT DETECTED NOT DETECTED   Stenotrophomonas maltophilia NOT DETECTED NOT DETECTED   Candida albicans NOT DETECTED NOT DETECTED   Candida auris NOT DETECTED NOT DETECTED   Candida glabrata NOT DETECTED NOT DETECTED   Candida krusei NOT DETECTED NOT DETECTED   Candida parapsilosis NOT DETECTED NOT DETECTED   Candida tropicalis NOT DETECTED NOT DETECTED   Cryptococcus neoformans/gattii NOT DETECTED NOT DETECTED   Meth resistant mecA/C and MREJ NOT DETECTED NOT DETECTED    Comment: Performed at [facility], [address] [place], [place] [address]  Glucose, capillary     Status: Abnormal   Collection Time: 09/15/22  9:16 PM  Result Value Ref Range   Glucose-Capillary 152 (H) 70 - 99 mg/dL    Comment: Glucose reference range applies only to samples taken after fasting for at least 8 hours.  Basic metabolic panel     Status: Abnormal   Collection Time: 09/16/22  2:04 AM  Result Value Ref Range   Sodium 133 (L) 135 - 145 mmol/L   Potassium 3.7 3.5 - 5.1 mmol/L   Chloride 94 (L) 98 - 111 mmol/L   CO2 27 22 - 32 mmol/L   Glucose, Bld 142 (H) 70 - 99 mg/dL    Comment: Glucose  reference range applies only to samples taken after fasting for at least 8 hours.   BUN 44 (H) 8 - 23 mg/dL   Creatinine, Ser 4.09 (H) 0.61 - 1.24 mg/dL   Calcium 8.7 (L) 8.9 - 10.3 mg/dL   GFR, Estimated 36 (L) >60 mL/min    Comment: (NOTE) Calculated using the CKD-EPI Creatinine Equation (2021)    Anion gap 12 5 - 15    Comment: Performed at [facility], [address], [place], [place] [address]  CBC with Differential/Platelet     Status: Abnormal   Collection Time: 09/16/22  2:04 AM  Result Value Ref Range   WBC 18.7 (H) 4.0 - 10.5 K/uL   RBC 3.04 (L) 4.22 - 5.81 MIL/uL   Hemoglobin  10.0 (L) 13.0 - 17.0 g/dL   HCT 47.8 (L) 29.5 - 62.1 %   MCV 99.7 80.0 - 100.0 fL   MCH 32.9 26.0 - 34.0 pg   MCHC 33.0 30.0 - 36.0 g/dL   RDW 30.8 65.7 - 84.6 %   Platelets 364 150 - 400 K/uL   nRBC 0.0 0.0 - 0.2 %   Neutrophils Relative % 85 %   Neutro Abs 15.9 (H) 1.7 - 7.7 K/uL   Lymphocytes Relative 5 %   Lymphs Abs 0.9 0.7 - 4.0 K/uL   Monocytes Relative 8 %   Monocytes Absolute 1.5 (H) 0.1 - 1.0 K/uL   Eosinophils Relative 1 %   Eosinophils Absolute 0.1 0.0 - 0.5 K/uL   Basophils Relative 0 %   Basophils Absolute 0.1 0.0 - 0.1 K/uL   Immature Granulocytes 1 %   Abs Immature Granulocytes 0.22 (H) 0.00 - 0.07 K/uL    Comment: Performed at [facility], [address], [place], [place] [address]  Lipid panel     Status: Abnormal   Collection Time: 09/16/22  6:46 AM  Result Value Ref Range   Cholesterol 51 0 - 200 mg/dL   Triglycerides 78 <284 mg/dL   HDL 14 (L) >13 mg/dL   Total CHOL/HDL Ratio 3.6 RATIO   VLDL 16 0 - 40 mg/dL   LDL Cholesterol 21 0 - 99 mg/dL    Comment:        Total Cholesterol/HDL:CHD Risk Coronary Heart Disease Risk Table                     Men   Women  1/2 Average Risk   3.4   3.3  Average Risk       5.0   4.4  2 X Average Risk   9.6   7.1  3 X Average Risk  23.4   11.0        Use the calculated Patient Ratio above and the CHD Risk Table to determine the patient's CHD Risk.        ATP III CLASSIFICATION (LDL):  <100     mg/dL   Optimal  244-010  mg/dL   Near or Above                    Optimal  130-159  mg/dL   Borderline  272-536  mg/dL   High  >644     mg/dL   Very High Performed at [facility], [address], [place], [place] [address]   Glucose, capillary     Status: Abnormal   Collection Time: 09/16/22  8:17 AM  Result Value Ref Range   Glucose-Capillary  137 (H) 70 - 99 mg/dL    Comment: Glucose reference range applies only to samples taken after fasting for at least 8 hours.  Glucose, capillary     Status: Abnormal    Collection Time: 09/16/22  9:33 AM  Result Value Ref Range   Glucose-Capillary 155 (H) 70 - 99 mg/dL    Comment: Glucose reference range applies only to samples taken after fasting for at least 8 hours.  Glucose, capillary     Status: Abnormal   Collection Time: 09/16/22 12:11 PM  Result Value Ref Range   Glucose-Capillary 148 (H) 70 - 99 mg/dL    Comment: Glucose reference range applies only to samples taken after fasting for at least 8 hours.  Glucose, capillary     Status: Abnormal   Collection Time: 09/16/22 12:48 PM  Result Value Ref Range   Glucose-Capillary 158 (H) 70 - 99 mg/dL    Comment: Glucose reference range applies only to samples taken after fasting for at least 8 hours.    DG Knee 1-2 Views Right  Result Date: 09/15/2022 CLINICAL DATA:  Right medial knee pain for 2 days EXAM: RIGHT KNEE - 1-2 VIEW COMPARISON:  01/09/2004 FINDINGS: Heavy atherosclerotic arterial vascular calcification. Small knee effusion. Lucency underlying the tibial tray along the medial plateau on the frontal projection (zones 1 and 2) and to a lesser extent along the anterior plateau of the tibial component on the lateral projection (zone 1) and along the posterior margin of the posterior tibial plateau on the lateral projection (zone 2). This can have an association with aseptic loosening. IMPRESSION: 1. Lucency underlying the portions of the tibial tray, which can have an association with aseptic loosening. 2. Small knee effusion. 3. Heavy atherosclerotic arterial vascular calcification. Electronically Signed   By: Gaylyn Rong M.D.   On: 09/15/2022 15:44   ECHOCARDIOGRAM LIMITED  Result Date: 09/14/2022    ECHOCARDIOGRAM LIMITED REPORT   Patient Name:   Johnny [name] Date of Exam: 09/14/2022 Medical Rec #:  [id number]       Height:       67.0 in Accession #:    [id number]      Weight:       200.0 lb Date of Birth:  21-Jul-1945       BSA:          2.022 m Patient Age:    87 years        BP:            109/68 mmHg Patient Gender: M               HR:           86 bpm. Exam Location:  Inpatient Procedure: Limited Echo Indications:    Elevated Troponin  History:        Patient has prior history of Echocardiogram examinations, most                 recent 05/25/2022. CHF, CAD, PAD and OSA, CKD; Risk                 Factors:Hypertension and Dyslipidemia.  Sonographer:    Milbert Coulter Referring Phys: [id number] MAURICIO DANIEL ARRIEN IMPRESSIONS  1. Left ventricular ejection fraction, by estimation, is 40 to 45%. The left ventricle has mildly decreased function. The left ventricle demonstrates regional wall motion abnormalities (see scoring diagram/findings for description). There is hypokinesis  of the left ventricular, basal-mid anteroseptal wall, inferoseptal wall and anterior wall.  Comparison(s): Changes from prior study are noted. Conclusion(s)/Recommendation(s): New wall motion abnormalities in the basal to mid septal and anterior walls, concerning for ischemia. Findings communicated with Dr. Ella Jubilee. FINDINGS  Left Ventricle: Left ventricular ejection fraction, by estimation, is 40 to 45%. The left ventricle has mildly decreased function. The left ventricle demonstrates regional wall motion abnormalities. LEFT VENTRICLE PLAX 2D LVIDd:         5.30 cm LVIDs:         4.60 cm LV PW:         1.20 cm LV IVS:        1.00 cm   AORTA Ao Root diam: 4.20 cm Jodelle Red MD Electronically signed by Jodelle Red MD Signature Date/Time: 09/14/2022/6:47:35 PM    Final     Review of Systems  Constitutional:  Positive for chills and diaphoresis. Negative for fever.  HENT:  Negative for ear discharge, ear pain, hearing loss and tinnitus.   Eyes:  Negative for photophobia and pain.  Respiratory:  Negative for cough and shortness of breath.   Cardiovascular:  Negative for chest pain.  Gastrointestinal:  Negative for abdominal pain, nausea and vomiting.  Genitourinary:  Negative for dysuria, flank pain,  frequency and urgency.  Musculoskeletal:  Positive for arthralgias (Right knee). Negative for back pain, myalgias and neck pain.  Neurological:  Negative for dizziness and headaches.  Hematological:  Does not bruise/bleed easily.  Psychiatric/Behavioral:  The patient is not nervous/anxious.    Blood pressure 121/64, pulse 78, temperature 97.8 F (36.6 C), temperature source Oral, resp. rate 16, height 5\' 7"  (1.702 m), weight 88.7 kg, SpO2 95 %. Physical Exam Constitutional:      General: He is not in acute distress.    Appearance: He is well-developed. He is not diaphoretic.  HENT:     Head: Normocephalic and atraumatic.  Eyes:     General: No scleral icterus.       Right eye: No discharge.        Left eye: No discharge.     Conjunctiva/sclera: Conjunctivae normal.  Cardiovascular:     Rate and Rhythm: Normal rate and regular rhythm.  Pulmonary:     Effort: Pulmonary effort is normal. No respiratory distress.  Musculoskeletal:     Cervical back: Normal range of motion.     Comments: RLE No traumatic wounds, ecchymosis, or rash  Nontender, painless AROM 180-85, able to ambulate  Mod knee effusion  Knee stable to varus/ valgus and anterior/posterior stress  Sens DPN, SPN, TN intact  Motor EHL, ext, flex, evers 5/5  DP 2+, PT 1+, No significant edema  Skin:    General: Skin is warm and dry.  Neurological:     Mental Status: He is alert.  Psychiatric:        Mood and Affect: Mood normal.        Behavior: Behavior normal.     Assessment/Plan: Right knee effusion -- Exam not c/w septic joint. I suspect his pain is related to his effusion, though. Will tap and send for cell count and culture. No WB or motion restrictions.    Freeman Caldron, PA-C Orthopedic Surgery [phone] 09/16/2022, 2:58 PM

## 2022-09-16 NOTE — Assessment & Plan Note (Addendum)
Continue nutritional supplements.  Continue thiamine.

## 2022-09-16 NOTE — Progress Notes (Addendum)
Progress Note   Patient: Johnny [name] UJW:[id number] DOB: Sep 10, 1945 DOA: 09/12/2022     3 DOS: the patient was seen and examined on 09/16/2022   Brief hospital course: Johnny [name] was admitted to the hospital with the working diagnosis of heart failure exacerbation.   77 yo male with the past medical history of coronary artery disease, sp CABG, abdominal aortic aneurysm, hypertension, obesity and dyslipidemia who presented with dyspnea. Reported progressive lower and upper extremity edema, along with dyspnea. On his initial physical examination his blood pressure was 130/69, HR 23m RR 16 and 02 saturation 97%, heart with S1 and S2 present and rhythmic, lungs with no wheezing or rales, abdomen with no distention, positive lower extremity edema.   Na 131, K 3,2 Cl 93, bicarbonate 24, glucose 143, bun 24, cr 1.29  Mg 1,7  High sensitive troponin 183 and 150  Wbc 13,4 hgb 10.0 plt 244  Sars covid 19 negative   Chest radiograph with cardiomegaly, bilateral hilar vascular congestion, bilateral atelectasis at bases with no effusions. Sternotomy wires in place.   EKG 75 bpm, normal axis, normal intervals, sinus rhythm with no significant ST segment, negative T wave lead V1 and V2. (Old changes).   05/ 20 patient has responded well to diuresis. Possible discharge tomorrow.  Check limited echocardiogram for elevated troponin.  05/21 echocardiogram with mild reduction in LV systolic function and positive new wall motion abnormalities.  05/22 positive blood culture for MSSA, ID and orthopedics have been formally consulted.   Assessment and Plan: * Septic joint of right knee joint (HCC) Sepsis not present on admission, sever in intensity with AKI, end organ damage.   Positive blood culture to MSSA.   Plan to continue antibiotic therapy with IV cefazolin.  Continue close monitoring of cell count.  Follow up with orthopedics and ID recommendations, he may need TEE.   Acute on chronic diastolic  CHF (congestive heart failure) (HCC) Limited echocardiogram with LV systolic function mildly reduced to 40 to 45%, hypokinesis of the basal-mid antero septal wall, inferoseptal wall and anterior wall.   Volume status has improved.     Continue antihypertensive medications and diuretics, due to worsening renal function.   Considering acute infection, no invasive testing recommended. Possible sepsis related decreased LV systolic function.  Plan to repeat echocardiogram, case discussed with Johnny [name] from cardiology.   Essential hypertension Hold on antihypertensive medications.    Coronary artery disease Patient had decreased energy for 24 hrs prior to admission.  Old records personally reviewed, patient had CABG in 1992 and non ischemic Myoview in 2012.  He has not had frank angina.   Continue aspirin and increase dose of statin.   CKD stage 3a, GFR 45-59 ml/min (HCC) AKI, hyponatremia.   Worsening renal function with serum cr at 1,90 with K at 3,7 and serum bicarbonate at 27.  Na 133.   Hold on diuretic therapy and will start on isotonic IV fluids 75 ml per hr, follow up renal function in am.   Peripheral arterial disease (HCC) Continue with aspirin and statin therapy.   Patient had aortobifemoral bypass in 2005. Has severe SFA disease bilaterally with ABIs in 0,87 range.   Hyperlipidemia Continue with ezetimibe and simvastatin (increase dose to 40 mg).   Obstructive sleep apnea C pap.   Class 1 obesity Calculated BMI is 31.6  Malnutrition of moderate degree Continue nutritional supplements.    Subjective: Patient with improvement in right knee pain but not back to baseline, no  chest pain and no dyspnea   Physical Exam: Vitals:   09/15/22 1934 09/16/22 0402 09/16/22 0817 09/16/22 1310  BP: 124/65 98/69  121/64  Pulse: 97 97 78   Resp: 17 16    Temp: 98.4 F (36.9 C) 97.8 F (36.6 C) 97.8 F (36.6 C) 97.8 F (36.6 C)  TempSrc: Oral Oral Oral Oral  SpO2: 96%  95% 95%   Weight:  88.7 kg    Height:       Neurology awake and alert ENT with mild pallor Cardiovascular with S1 and S2 present and rhythmic with no gallops No JVD Respiratory with no rales or wheezing, no rhonchi Abdomen with no distention  No lower extremity edema, right knee continue edematous and tender, no significant erythema.  Data Reviewed:    Family Communication: no family at the bedside. I spoke with patient's son, we talked in detail about patient's condition, plan of care and prognosis and all questions were addressed.    Disposition: Status is: Inpatient Remains inpatient appropriate because: IV antibiotic therapy   Planned Discharge Destination: Home     Author: Coralie Keens, MD 09/16/2022 2:59 PM  For on call review [email or web address].

## 2022-09-16 NOTE — Progress Notes (Addendum)
Rounding Note    Patient Name: Johnny [name] Date of Encounter: 09/16/2022  [facility] Cardiologist: Nanetta Batty, MD   Subjective   Feels much better today, sitting up in bed  Inpatient Medications    Scheduled Meds:  aspirin EC  81 mg Oral Daily   diclofenac Sodium  4 g Topical QID   ezetimibe  10 mg Oral Daily   famotidine  40 mg Oral Daily   feeding supplement  237 mL Oral TID BM   insulin aspart  0-5 Units Subcutaneous QHS   insulin aspart  0-9 Units Subcutaneous TID WC   multivitamin with minerals  1 tablet Oral Daily   simvastatin  40 mg Oral Daily   Continuous Infusions:  sodium chloride     cefTRIAXone (ROCEPHIN)  IV 2 g (09/15/22 2208)   thiamine (VITAMIN B1) injection 500 mg (09/15/22 1857)   PRN Meds: acetaminophen, alum & mag hydroxide-simeth, diphenhydrAMINE, melatonin, morphine injection, polyethylene glycol, traMADol   Vital Signs    Vitals:   09/15/22 1539 09/15/22 1934 09/16/22 0402 09/16/22 0817  BP:  124/65 98/69   Pulse:  97 97 78  Resp:  17 16   Temp: 98.7 F (37.1 C) 98.4 F (36.9 C) 97.8 F (36.6 C) 97.8 F (36.6 C)  TempSrc: Oral Oral Oral Oral  SpO2:  96% 95% 95%  Weight:   88.7 kg   Height:        Intake/Output Summary (Last 24 hours) at 09/16/2022 0840 Last data filed at 09/15/2022 2000 Gross per 24 hour  Intake --  Output 575 ml  Net -575 ml      09/16/2022    4:02 AM 09/15/2022    4:34 AM 09/14/2022    4:51 AM  Last 3 Weights  Weight (lbs) 195 lb 9.6 oz 195 lb 199 lb 15.3 oz  Weight (kg) 88.724 kg 88.451 kg 90.7 kg      Telemetry    NSR  HR 70-90 - Personally Reviewed  ECG    No new - Personally Reviewed  Physical Exam   GEN: No acute distress.   Neck: No JVD Cardiac: RRR, soft outflow murmur, no rubs, or gallops.  Respiratory: Clear to auscultation bilaterally. MS: No edema; No deformity. R knee swollen, no redness or warmth Neuro:  Nonfocal  Psych: Normal affect   Labs    High  Sensitivity Troponin:   Recent Labs  Lab 09/12/22 1311 09/12/22 1700  TROPONINIHS 183* 150*     Chemistry Recent Labs  Lab 09/12/22 1311 09/13/22 0256 09/14/22 0353 09/15/22 0228 09/16/22 0204  NA 131* 131* 131* 133* 133*  K 3.2* 3.6 3.6 3.7 3.7  CL 93* 94* 93* 93* 94*  CO2 24 26 25 25 27   GLUCOSE 143* 153* 118* 160* 142*  BUN 24* 19 21 31* 44*  CREATININE 1.29* 1.28* 1.52* 1.79* 1.90*  CALCIUM 8.2* 8.1* 8.4* 8.7* 8.7*  MG 1.7 1.6* 1.8  --   --   PROT 7.1  --   --   --   --   ALBUMIN 2.9*  --   --   --   --   AST 31  --   --   --   --   ALT 25  --   --   --   --   ALKPHOS 81  --   --   --   --   BILITOT 0.9  --   --   --   --  GFRNONAA 57* 57* 47* 38* 36*  ANIONGAP 14 11 13 15 12     Lipids  Recent Labs  Lab 09/16/22 0646  CHOL 51  TRIG 78  HDL 14*  LDLCALC 21  CHOLHDL 3.6    Hematology Recent Labs  Lab 09/14/22 0353 09/15/22 1543 09/16/22 0204  WBC 14.1* 19.2* 18.7*  RBC 3.03* 3.21* 3.04*  HGB 9.8* 10.6* 10.0*  HCT 30.0* 32.2* 30.3*  MCV 99.0 100.3* 99.7  MCH 32.3 33.0 32.9  MCHC 32.7 32.9 33.0  RDW 13.2 13.2 13.3  PLT 283 377 364    BNP Recent Labs  Lab 09/12/22 1311  BNP 131.6*     Radiology    DG Knee 1-2 Views Right  Result Date: 09/15/2022 CLINICAL DATA:  Right medial knee pain for 2 days EXAM: RIGHT KNEE - 1-2 VIEW COMPARISON:  01/09/2004 FINDINGS: Heavy atherosclerotic arterial vascular calcification. Small knee effusion. Lucency underlying the tibial tray along the medial plateau on the frontal projection (zones 1 and 2) and to a lesser extent along the anterior plateau of the tibial component on the lateral projection (zone 1) and along the posterior margin of the posterior tibial plateau on the lateral projection (zone 2). This can have an association with aseptic loosening. IMPRESSION: 1. Lucency underlying the portions of the tibial tray, which can have an association with aseptic loosening. 2. Small knee effusion. 3. Heavy  atherosclerotic arterial vascular calcification. Electronically Signed   By: Gaylyn Rong M.D.   On: 09/15/2022 15:44   ECHOCARDIOGRAM LIMITED  Result Date: 09/14/2022    ECHOCARDIOGRAM LIMITED REPORT   Patient Name:   Johnny [name] Date of Exam: 09/14/2022 Medical Rec #:  [id number]       Height:       67.0 in Accession #:    [id number]      Weight:       200.0 lb Date of Birth:  1946-04-26       BSA:          2.022 m Patient Age:    77 years        BP:           109/68 mmHg Patient Gender: M               HR:           86 bpm. Exam Location:  Inpatient Procedure: Limited Echo Indications:    Elevated Troponin  History:        Patient has prior history of Echocardiogram examinations, most                 recent 05/25/2022. CHF, CAD, PAD and OSA, CKD; Risk                 Factors:Hypertension and Dyslipidemia.  Sonographer:    Milbert Coulter Referring Phys: [id number] MAURICIO DANIEL ARRIEN IMPRESSIONS  1. Left ventricular ejection fraction, by estimation, is 40 to 45%. The left ventricle has mildly decreased function. The left ventricle demonstrates regional wall motion abnormalities (see scoring diagram/findings for description). There is hypokinesis  of the left ventricular, basal-mid anteroseptal wall, inferoseptal wall and anterior wall. Comparison(s): Changes from prior study are noted. Conclusion(s)/Recommendation(s): New wall motion abnormalities in the basal to mid septal and anterior walls, concerning for ischemia. Findings communicated with Dr. Ella Jubilee. FINDINGS  Left Ventricle: Left ventricular ejection fraction, by estimation, is 40 to 45%. The left ventricle has mildly decreased function. The left ventricle demonstrates regional wall motion abnormalities.  LEFT VENTRICLE PLAX 2D LVIDd:         5.30 cm LVIDs:         4.60 cm LV PW:         1.20 cm LV IVS:        1.00 cm   AORTA Ao Root diam: 4.20 cm Jodelle Red MD Electronically signed by Jodelle Red MD Signature Date/Time:  09/14/2022/6:47:35 PM    Final     Cardiac Studies   Echo 09/14/22 IMPRESSIONS   1. Left ventricular ejection fraction, by estimation, is 40 to 45%. The  left ventricle has mildly decreased function. The left ventricle  demonstrates regional wall motion abnormalities (see scoring  diagram/findings for description). There is hypokinesis   of the left ventricular, basal-mid anteroseptal wall, inferoseptal wall  and anterior wall.   Comparison(s): Changes from prior study are noted.   Conclusion(s)/Recommendation(s): New wall motion abnormalities in the  basal to mid septal and anterior walls, concerning for ischemia.     Patient Profile     77 y.o. male with a hx of CAD status post coronary artery bypass grafting in 1992,  aortobifemoral bypass grafting secondary to abdominal aortic aneurysm in 2005, hypertension, hyperlipidemia, OSA on CPAP, mild aortic stenosis, ascending aortic dilatation (43mm), CKD stage 3a, who was seen 09/15/2022 for the evaluation of new onset congestive heart failure at the request of Dr. Ella Jubilee.   Assessment & Plan    New onset HFrEF Hypertension -- presented with signs of hypervolemia on admission with complaints of DOE and has been diuresed.  Subsequent echocardiogram on 09/14/2022 indicating a reduction in LVEF of 40 to 45% with new regional wall motion abnormalities in the left ventricular, basal-mid anteroseptal wall, inferoseptal wall and anterior wall.  EKG showing normal sinus rhythm with T wave inversions in the anterior leads which are similar to readings this year however different from reading in 2023.  Additionally patient had elevated troponins on admission of 183-150.  Patient has also stated a significant and sudden decline in functional capacity as well worsening symptoms of shortness of breath, orthopnea, peripheral edema.  Recommend ischemic evaluation however given possible infection and unstable renal function would need to delay until more stable.   -- BP soft, continue to hold losartan  -- Creatinine/BUN continues to trend up, continue holding diruesis --GDMT: hold jardiance, losartan, and spiro for now given acute illness, will add beta blocker as BP tolerates  CAD s/p CABG 1992 PAD s/p AO-bifem 2005 -- echocardiogram on 09/14/2022 indicating a reduction in LVEF of 40 to 45% with new regional wall motion abnormalities in the left ventricular, basal-mid anteroseptal wall, inferoseptal wall and anterior wall.  -- eventually will need ischemic evaluation, will defer until acute illness is resolved due to declining renal function and potential infectious process.  -- continue asprin 81mg  daily, zetia 10mg  daily, and zocor 40mg  daily  AKI on CKD stage IIIa -- creatinine 1.79>>1.9 this AM  -- continue to hold diuresis -- IV NS at 41ml/hr added by medicine  HLD --continue zertia and zocor as above  Mild aortic stenosis --continue to monitor outpatient   Per Primary R knee swelling/warmth Leukocytosis Altered Mental Status   For questions or updates, please contact [facility] Please consult [email or web address] for contact info under        Signed, Osborne Oman, RN Student Nurse Practitioner 09/16/2022, 8:40 AM     Agree with note by Deniece Ree, NP student, supervised  Patient well-known to me having  taken care of him as an outpatient for the last 32 years.  He has a history of ischemic heart disease status post bypass grafting in 1992.  Echo performed earlier this year showed preserved LV function with mild aortic stenosis.  Other problems as outlined including essential hypertension, peripheral vascular disease status post aortobifemoral bypass grafting.  He was noted to have a leukocytosis.  His mentation was slightly off and he felt fatigued.  2D echo revealed a decline in his EF down to 40 to 45% with focal wall motion abnormalities.  His EKG does show new mild anterior T wave inversion.  His serum creatinine is elevated.   Blood cultures were obtained.  He was started on Rocephin.  He feels clinically improved today.  More complete 2D echo will be obtained.  His exam is benign.  There is no intention to perform an invasive evaluation at this time but rather to treat potential infectious etiology.  Will consider repeat echo in 2 to 3 months and Myoview stress test to rule out an ischemic etiology as an outpatient.  Runell Gess, M.D., FACP, [facility], [facility] [address] [place], [place]  [address]  [phone] 09/16/2022 10:59 AM

## 2022-09-16 NOTE — Progress Notes (Signed)
Consult request received for possible septic right total knee performed remotely by Dr. Trudee Grip. Have discussed with the requesting MD patient's stability, pain control, and ongoing work up. I have reviewed x-rays and developed a provisional plan.   Full consultation to follow.  Myrene Galas, MD [facility], [facility] [phone]

## 2022-09-16 NOTE — Progress Notes (Signed)
  Echocardiogram 2D Echocardiogram has been performed.  Johnny [name] 09/16/2022, 3:30 PM

## 2022-09-16 NOTE — TOC Initial Note (Signed)
Transition of Care [facility]) - Initial/Assessment Note    Patient Details  Name: Johnny [name] MRN: [id number] Date of Birth: 05/26/1945  Transition of Care [facility]) CM/SW Contact:    Gala Lewandowsky, RN Phone Number: 09/16/2022, 12:40 PM  Clinical Narrative: Patient presented for shortness of breath. 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 Son is staying with the spouse while the patient is hospitalized. Patient provided verbal permission for Case Manager to call his son Denyse Amass. Case Manager spoke with Denyse Amass and he had some concerns regarding the care that his parents may need in the home. Case Manager discussed home health services with the patient and son- patient is agreeable to RN, PT,OT with [facility]- Medicare.gov list discussed. Referral submitted to [facility] and start of care to begin within 24-48 hours post transition home. Case Manager discussed ALF option, personal care services that is an out of pocket cost, and VA assistance (homemaker aide program). Patient is a Cytogeneticist; however, he does not have a PCP at the [place]. Family to call the VA to get this established. Personal Care brochures will be provided to the family on Thursday. Case Manager will continue to follow for additional transition of care needs as the patient progresses.               Expected Discharge Plan: Home w Home Health Services Barriers to Discharge: Continued Medical Work up   Patient Goals and CMS Choice Patient states their goals for this hospitalization and ongoing recovery are:: to return home CMS Medicare.gov Compare Post Acute Care list provided to:: Patient Choice offered to / list presented to : Adult Children, Patient      Expected Discharge Plan and Services In-house Referral: NA Discharge Planning Services: CM Consult Post Acute Care Choice: Home Health Living arrangements for the past 2 months: Single Family Home   HH Arranged: RN, Disease Management,  PT, OT HH Agency: [facility] Date [facility] Agency Contacted: 09/16/22 Time HH Agency Contacted: 1239 Representative spoke with at [facility] Agency: Kandee Keen  Prior Living Arrangements/Services Living arrangements for the past 2 months: Single Family Home Lives with:: Spouse (caregiver for spouse.) Patient language and need for interpreter reviewed:: Yes Do you feel safe going back to the place where you live?: Yes      Need for Family Participation in Patient Care: Yes (Comment) Care giver support system in place?: Yes (comment)   Criminal Activity/Legal Involvement Pertinent to Current Situation/Hospitalization: No - Comment as needed  Activities of Daily Living Home Assistive Devices/Equipment: Eyeglasses, Scales ADL Screening (condition at time of admission) Patient's cognitive ability adequate to safely complete daily activities?: Yes Is the patient deaf or have difficulty hearing?: No Does the patient have difficulty seeing, even when wearing glasses/contacts?: Yes Does the patient have difficulty concentrating, remembering, or making decisions?: No Patient able to express need for assistance with ADLs?: Yes Does the patient have difficulty dressing or bathing?: No Independently performs ADLs?: Yes (appropriate for developmental age) Does the patient have difficulty walking or climbing stairs?: No Weakness of Legs: None Weakness of Arms/Hands: None  Permission Sought/Granted Permission sought to share information with : Family Supports, Magazine features editor, Case Estate manager/land agent granted to share information with : Yes, Verbal Permission Granted     Permission granted to share info w AGENCY: [facility]        Emotional Assessment Appearance:: Appears stated age Attitude/Demeanor/Rapport: Engaged Affect (typically observed): Appropriate Orientation: : Oriented to  Situation, Oriented to  Time, Oriented to Self Alcohol / Substance Use: Not Applicable    Admission  diagnosis:  CHF exacerbation (HCC) [I50.9] Acute congestive heart failure, unspecified heart failure type (HCC) [I50.9] Heart failure (HCC) [I50.9] Patient Active Problem List   Diagnosis Date Noted   Malnutrition of moderate degree 09/15/2022   CKD stage 3a, GFR 45-59 ml/min (HCC) 09/14/2022   Class 1 obesity 09/13/2022   Leukocytosis 09/13/2022   Heart failure (HCC) 09/13/2022   Acute on chronic diastolic CHF (congestive heart failure) (HCC) 09/12/2022   Mild aortic stenosis 05/19/2019   Coronary artery disease 03/29/2013   Essential hypertension 03/29/2013   Hyperlipidemia 03/29/2013   Obstructive sleep apnea 03/29/2013   Peripheral arterial disease (HCC) 03/29/2013   PCP:  Daisy Floro, MD Pharmacy:   [facility] [place], [place] - [address] [place] [place] [address] Phone: [phone] Fax: [phone]  [id number] - [place], [place] - [address] [place] [address] Phone: [phone] Fax: [phone]  [facility] [address] [place] - [address] [place] [address] Phone: [phone] Fax: [phone]  [facility] - [place], [place] - [address] [place] [address] Phone: [phone] Fax: [phone]  Social Determinants of Health (SDOH) Social History: SDOH Screenings   Food Insecurity: No Food Insecurity (09/12/2022)  Housing: Low Risk  (09/12/2022)  Transportation Needs: No Transportation Needs (09/12/2022)  Utilities: Not At Risk (09/12/2022)  Tobacco Use: Medium Risk (09/12/2022)   SDOH Interventions:     Readmission Risk Interventions     No data to display

## 2022-09-17 DIAGNOSIS — I25112 Atherosclerotic heart disease of native coronary artery with refractory angina pectoris: Secondary | ICD-10-CM | POA: Diagnosis not present

## 2022-09-17 DIAGNOSIS — I2510 Atherosclerotic heart disease of native coronary artery without angina pectoris: Secondary | ICD-10-CM | POA: Diagnosis not present

## 2022-09-17 DIAGNOSIS — I519 Heart disease, unspecified: Secondary | ICD-10-CM

## 2022-09-17 DIAGNOSIS — B9561 Methicillin susceptible Staphylococcus aureus infection as the cause of diseases classified elsewhere: Secondary | ICD-10-CM | POA: Diagnosis not present

## 2022-09-17 DIAGNOSIS — I10 Essential (primary) hypertension: Secondary | ICD-10-CM | POA: Diagnosis not present

## 2022-09-17 DIAGNOSIS — R7881 Bacteremia: Secondary | ICD-10-CM | POA: Diagnosis not present

## 2022-09-17 DIAGNOSIS — N1831 Chronic kidney disease, stage 3a: Secondary | ICD-10-CM

## 2022-09-17 DIAGNOSIS — I5033 Acute on chronic diastolic (congestive) heart failure: Secondary | ICD-10-CM | POA: Diagnosis not present

## 2022-09-17 DIAGNOSIS — T8453XA Infection and inflammatory reaction due to internal right knee prosthesis, initial encounter: Secondary | ICD-10-CM | POA: Diagnosis not present

## 2022-09-17 DIAGNOSIS — M00061 Staphylococcal arthritis, right knee: Secondary | ICD-10-CM | POA: Diagnosis not present

## 2022-09-17 LAB — GLUCOSE, CAPILLARY
Glucose-Capillary: 177 mg/dL — ABNORMAL HIGH (ref 70–99)
Glucose-Capillary: 134 mg/dL — ABNORMAL HIGH (ref 70–99)
Glucose-Capillary: 150 mg/dL — ABNORMAL HIGH (ref 70–99)
Glucose-Capillary: 154 mg/dL — ABNORMAL HIGH (ref 70–99)

## 2022-09-17 LAB — BASIC METABOLIC PANEL
Anion gap: 15 (ref 5–15)
BUN: 40 mg/dL — ABNORMAL HIGH (ref 8–23)
CO2: 22 mmol/L (ref 22–32)
Calcium: 9.2 mg/dL (ref 8.9–10.3)
Chloride: 97 mmol/L — ABNORMAL LOW (ref 98–111)
Creatinine, Ser: 1.65 mg/dL — ABNORMAL HIGH (ref 0.61–1.24)
GFR, Estimated: 42 mL/min — ABNORMAL LOW (ref >60)
Glucose, Bld: 150 mg/dL — ABNORMAL HIGH (ref 70–99)
Potassium: 3.2 mmol/L — ABNORMAL LOW (ref 3.5–5.1)
Sodium: 134 mmol/L — ABNORMAL LOW (ref 135–145)

## 2022-09-17 LAB — CBC WITH DIFFERENTIAL/PLATELET
Abs Immature Granulocytes: 0.46 10*3/uL — ABNORMAL HIGH (ref 0.00–0.07)
Basophils Absolute: 0 10*3/uL (ref 0.0–0.1)
Basophils Relative: 0 %
Eosinophils Absolute: 0.1 10*3/uL (ref 0.0–0.5)
Eosinophils Relative: 1 %
HCT: 29.8 % — ABNORMAL LOW (ref 39.0–52.0)
Hemoglobin: 10.3 g/dL — ABNORMAL LOW (ref 13.0–17.0)
Immature Granulocytes: 3 %
Lymphocytes Relative: 5 %
Lymphs Abs: 0.8 10*3/uL (ref 0.7–4.0)
MCH: 33.8 pg (ref 26.0–34.0)
MCHC: 34.6 g/dL (ref 30.0–36.0)
MCV: 97.7 fL (ref 80.0–100.0)
Monocytes Absolute: 1.4 10*3/uL — ABNORMAL HIGH (ref 0.1–1.0)
Monocytes Relative: 8 %
Neutro Abs: 15 10*3/uL — ABNORMAL HIGH (ref 1.7–7.7)
Neutrophils Relative %: 83 %
Platelets: 420 10*3/uL — ABNORMAL HIGH (ref 150–400)
RBC: 3.05 MIL/uL — ABNORMAL LOW (ref 4.22–5.81)
RDW: 13.4 % (ref 11.5–15.5)
WBC: 17.8 10*3/uL — ABNORMAL HIGH (ref 4.0–10.5)
nRBC: 0 % (ref 0.0–0.2)

## 2022-09-17 LAB — CULTURE, BLOOD (ROUTINE X 2): Special Requests: ADEQUATE

## 2022-09-17 MED ORDER — METOPROLOL TARTRATE 12.5 MG HALF TABLET
12.5000 mg | ORAL_TABLET | Freq: Two times a day (BID) | ORAL | Status: DC
  Administered 2022-09-17 – 2022-09-18 (×4): 12.5 mg via ORAL
  Filled 2022-09-17 (×4): qty 1

## 2022-09-17 MED ORDER — QUETIAPINE FUMARATE 50 MG PO TABS
50.0000 mg | ORAL_TABLET | Freq: Once | ORAL | Status: AC
  Administered 2022-09-17: 50 mg via ORAL
  Filled 2022-09-17: qty 1

## 2022-09-17 MED ORDER — POTASSIUM CHLORIDE CRYS ER 20 MEQ PO TBCR
60.0000 meq | EXTENDED_RELEASE_TABLET | Freq: Once | ORAL | Status: AC
  Administered 2022-09-17: 60 meq via ORAL
  Filled 2022-09-17: qty 3

## 2022-09-17 NOTE — Plan of Care (Signed)

## 2022-09-17 NOTE — Progress Notes (Addendum)
Rounding Note    Patient Name: Johnny [name] Date of Encounter: 09/17/2022  [facility] Cardiologist: Nanetta Batty, MD   Subjective   Seems frustrated this morning, depressed about the need for knee surgery. Remains weak.   Inpatient Medications    Scheduled Meds:  aspirin EC  81 mg Oral Daily   diclofenac Sodium  4 g Topical QID   ezetimibe  10 mg Oral Daily   famotidine  40 mg Oral Daily   feeding supplement  237 mL Oral TID BM   insulin aspart  0-5 Units Subcutaneous QHS   insulin aspart  0-9 Units Subcutaneous TID WC   multivitamin with minerals  1 tablet Oral Daily   simvastatin  40 mg Oral Daily   Continuous Infusions:  sodium chloride 75 mL/hr at 09/17/22 0306    ceFAZolin (ANCEF) IV 2 g (09/17/22 0836)   thiamine (VITAMIN B1) injection Stopped (09/16/22 1809)   PRN Meds: acetaminophen, alum & mag hydroxide-simeth, diphenhydrAMINE, melatonin, morphine injection, polyethylene glycol, traMADol   Vital Signs    Vitals:   09/16/22 1310 09/16/22 2003 09/17/22 0421 09/17/22 0643  BP: 121/64 127/67 120/69   Pulse:   83   Resp:  20 20   Temp: 97.8 F (36.6 C) 98.2 F (36.8 C) 97.6 F (36.4 C)   TempSrc: Oral Oral Oral   SpO2:   94%   Weight:    90.7 kg  Height:        Intake/Output Summary (Last 24 hours) at 09/17/2022 0904 Last data filed at 09/17/2022 1610 Gross per 24 hour  Intake 1581.5 ml  Output 400 ml  Net 1181.5 ml      09/17/2022    6:43 AM 09/16/2022    4:02 AM 09/15/2022    4:34 AM  Last 3 Weights  Weight (lbs) 200 lb 195 lb 9.6 oz 195 lb  Weight (kg) 90.719 kg 88.724 kg 88.451 kg      Telemetry    Sinus rhythm, sinus tach - Personally Reviewed  ECG    No new tracing   Physical Exam   GEN: No acute distress.   Neck: No JVD Cardiac: RRR, no murmurs, rubs, or gallops.  Respiratory: Clear to auscultation bilaterally. GI: Soft, nontender, non-distended  MS: No edema; Right knee swelling Neuro:  Nonfocal  Psych:  Normal affect   Labs    High Sensitivity Troponin:   Recent Labs  Lab 09/12/22 1311 09/12/22 1700  TROPONINIHS 183* 150*     Chemistry Recent Labs  Lab 09/12/22 1311 09/13/22 0256 09/14/22 0353 09/15/22 0228 09/16/22 0204 09/17/22 0312  NA 131* 131* 131* 133* 133* 134*  K 3.2* 3.6 3.6 3.7 3.7 3.2*  CL 93* 94* 93* 93* 94* 97*  CO2 24 26 25 25 27 22   GLUCOSE 143* 153* 118* 160* 142* 150*  BUN 24* 19 21 31* 44* 40*  CREATININE 1.29* 1.28* 1.52* 1.79* 1.90* 1.65*  CALCIUM 8.2* 8.1* 8.4* 8.7* 8.7* 9.2  MG 1.7 1.6* 1.8  --   --   --   PROT 7.1  --   --   --   --   --   ALBUMIN 2.9*  --   --   --   --   --   AST 31  --   --   --   --   --   ALT 25  --   --   --   --   --   [facility]  81  --   --   --   --   --   BILITOT 0.9  --   --   --   --   --   GFRNONAA 57* 57* 47* 38* 36* 42*  ANIONGAP 14 11 13 15 12 15     Lipids  Recent Labs  Lab 09/16/22 0646  CHOL 51  TRIG 78  HDL 14*  LDLCALC 21  CHOLHDL 3.6    Hematology Recent Labs  Lab 09/15/22 1543 09/16/22 0204 09/17/22 0312  WBC 19.2* 18.7* 17.8*  RBC 3.21* 3.04* 3.05*  HGB 10.6* 10.0* 10.3*  HCT 32.2* 30.3* 29.8*  MCV 100.3* 99.7 97.7  MCH 33.0 32.9 33.8  MCHC 32.9 33.0 34.6  RDW 13.2 13.3 13.4  PLT 377 364 420*   Thyroid No results for input(s): "TSH", "FREET4" in the last 168 hours.  BNP Recent Labs  Lab 09/12/22 1311  BNP 131.6*    DDimer No results for input(s): "DDIMER" in the last 168 hours.   Radiology    ECHOCARDIOGRAM COMPLETE  Result Date: 09/16/2022    ECHOCARDIOGRAM REPORT   Patient Name:   Johnny [name] Date of Exam: 09/16/2022 Medical Rec #:  [id number]       Height:       67.0 in Accession #:    [id number]      Weight:       195.6 lb Date of Birth:  1946/03/01       BSA:          2.003 m Patient Age:    77 years        BP:           121/64 mmHg Patient Gender: M               HR:           73 bpm. Exam Location:  Inpatient Procedure: 2D Echo, Color Doppler and Cardiac Doppler  Indications:    congestive heart failure  History:        Patient has prior history of Echocardiogram examinations, most                 recent 09/14/2022. CAD, PAD and chronic kidney disease,                 Signs/Symptoms:Bacteremia, Dyspnea and Edema; Risk Factors:Sleep                 Apnea, Hypertension and Dyslipidemia.  Sonographer:    Delcie Roch RDCS Referring Phys: [id number] MAURICIO DANIEL ARRIEN IMPRESSIONS  1. Left ventricular ejection fraction, by estimation, is 45 to 50%. The left ventricle has mildly decreased function. The left ventricle demonstrates global hypokinesis. Left ventricular diastolic parameters are consistent with Grade I diastolic dysfunction (impaired relaxation).  2. Right ventricular systolic function is normal. The right ventricular size is normal. There is normal pulmonary artery systolic pressure.  3. The mitral valve is normal in structure. No evidence of mitral valve regurgitation. No evidence of mitral stenosis. Moderate mitral annular calcification.  4. The aortic valve is normal in structure. There is severe calcifcation of the aortic valve. There is severe thickening of the aortic valve. Aortic valve regurgitation is not visualized. Mild aortic valve stenosis. Aortic valve mean gradient measures 13.0 mmHg. Aortic valve Vmax measures 2.39 m/s.  5. Aortic dilatation noted. There is mild dilatation of the ascending aorta, measuring 41 mm.  6. The inferior vena cava is normal in size with  greater than 50% respiratory variability, suggesting right atrial pressure of 3 mmHg. Comparison(s): No significant change from prior study. Prior images reviewed side by side. FINDINGS  Left Ventricle: Left ventricular ejection fraction, by estimation, is 45 to 50%. The left ventricle has mildly decreased function. The left ventricle demonstrates global hypokinesis. The left ventricular internal cavity size was normal in size. There is  no left ventricular hypertrophy. Left ventricular  diastolic parameters are consistent with Grade I diastolic dysfunction (impaired relaxation). Right Ventricle: The right ventricular size is normal. No increase in right ventricular wall thickness. Right ventricular systolic function is normal. There is normal pulmonary artery systolic pressure. The tricuspid regurgitant velocity is 1.89 m/s, and  with an assumed right atrial pressure of 3 mmHg, the estimated right ventricular systolic pressure is 17.3 mmHg. Left Atrium: Left atrial size was normal in size. Right Atrium: Right atrial size was normal in size. Pericardium: There is no evidence of pericardial effusion. Mitral Valve: The mitral valve is normal in structure. Moderate mitral annular calcification. No evidence of mitral valve regurgitation. No evidence of mitral valve stenosis. Tricuspid Valve: The tricuspid valve is normal in structure. Tricuspid valve regurgitation is not demonstrated. No evidence of tricuspid stenosis. Aortic Valve: The aortic valve is normal in structure. There is severe calcifcation of the aortic valve. There is severe thickening of the aortic valve. Aortic valve regurgitation is not visualized. Mild aortic stenosis is present. Aortic valve mean gradient measures 13.0 mmHg. Aortic valve peak gradient measures 22.8 mmHg. Aortic valve area, by VTI measures 1.53 cm. Pulmonic Valve: The pulmonic valve was normal in structure. Pulmonic valve regurgitation is mild. No evidence of pulmonic stenosis. Aorta: Aortic dilatation noted. There is mild dilatation of the ascending aorta, measuring 41 mm. Venous: The inferior vena cava is normal in size with greater than 50% respiratory variability, suggesting right atrial pressure of 3 mmHg. IAS/Shunts: No atrial level shunt detected by color flow Doppler.  LEFT VENTRICLE PLAX 2D LVIDd:         4.70 cm      Diastology LVIDs:         3.60 cm      LV e' medial:    7.30 cm/s LV PW:         1.20 cm      LV E/e' medial:  10.7 LV IVS:        1.20 cm       LV e' lateral:   8.86 cm/s LVOT diam:     2.10 cm      LV E/e' lateral: 8.8 LV SV:         69 LV SV Index:   34 LVOT Area:     3.46 cm  LV Volumes (MOD) LV vol d, MOD A2C: 120.0 ml LV vol s, MOD A2C: 61.7 ml LV SV MOD A2C:     58.3 ml RIGHT VENTRICLE             IVC RV Basal diam:  2.50 cm     IVC diam: 1.50 cm RV S prime:     12.30 cm/s TAPSE (M-mode): 1.0 cm LEFT ATRIUM             Index        RIGHT ATRIUM           Index LA diam:        3.60 cm 1.80 cm/m   RA Area:     13.60 cm LA Vol (A2C):  37.8 ml 18.87 ml/m  RA Volume:   28.60 ml  14.28 ml/m LA Vol (A4C):   36.1 ml 18.02 ml/m LA Biplane Vol: 38.3 ml 19.12 ml/m  AORTIC VALVE AV Area (Vmax):    1.52 cm AV Area (Vmean):   1.46 cm AV Area (VTI):     1.53 cm AV Vmax:           239.00 cm/s AV Vmean:          165.000 cm/s AV VTI:            0.451 m AV Peak Grad:      22.8 mmHg AV Mean Grad:      13.0 mmHg LVOT Vmax:         105.00 cm/s LVOT Vmean:        69.500 cm/s LVOT VTI:          0.200 m LVOT/AV VTI ratio: 0.44  AORTA Ao Root diam: 3.60 cm Ao Asc diam:  4.10 cm MITRAL VALVE               TRICUSPID VALVE MV Area (PHT): 3.53 cm    TR Peak grad:   14.3 mmHg MV Decel Time: 215 msec    TR Vmax:        189.00 cm/s MV E velocity: 77.80 cm/s MV A velocity: 90.00 cm/s  SHUNTS MV E/A ratio:  0.86        Systemic VTI:  0.20 m                            Systemic Diam: 2.10 cm Donato Schultz MD Electronically signed by Donato Schultz MD Signature Date/Time: 09/16/2022/3:42:34 PM    Final    DG Knee 1-2 Views Right  Result Date: 09/15/2022 CLINICAL DATA:  Right medial knee pain for 2 days EXAM: RIGHT KNEE - 1-2 VIEW COMPARISON:  01/09/2004 FINDINGS: Heavy atherosclerotic arterial vascular calcification. Small knee effusion. Lucency underlying the tibial tray along the medial plateau on the frontal projection (zones 1 and 2) and to a lesser extent along the anterior plateau of the tibial component on the lateral projection (zone 1) and along the posterior margin of  the posterior tibial plateau on the lateral projection (zone 2). This can have an association with aseptic loosening. IMPRESSION: 1. Lucency underlying the portions of the tibial tray, which can have an association with aseptic loosening. 2. Small knee effusion. 3. Heavy atherosclerotic arterial vascular calcification. Electronically Signed   By: Gaylyn Rong M.D.   On: 09/15/2022 15:44    Cardiac Studies   Echo: 09/16/2022  IMPRESSIONS     1. Left ventricular ejection fraction, by estimation, is 45 to 50%. The  left ventricle has mildly decreased function. The left ventricle  demonstrates global hypokinesis. Left ventricular diastolic parameters are  consistent with Grade I diastolic  dysfunction (impaired relaxation).   2. Right ventricular systolic function is normal. The right ventricular  size is normal. There is normal pulmonary artery systolic pressure.   3. The mitral valve is normal in structure. No evidence of mitral valve  regurgitation. No evidence of mitral stenosis. Moderate mitral annular  calcification.   4. The aortic valve is normal in structure. There is severe calcifcation  of the aortic valve. There is severe thickening of the aortic valve.  Aortic valve regurgitation is not visualized. Mild aortic valve stenosis.  Aortic valve mean gradient measures  13.0 mmHg. Aortic valve Vmax  measures 2.39 m/s.   5. Aortic dilatation noted. There is mild dilatation of the ascending  aorta, measuring 41 mm.   6. The inferior vena cava is normal in size with greater than 50%  respiratory variability, suggesting right atrial pressure of 3 mmHg.   Comparison(s): No significant change from prior study. Prior images  reviewed side by side.   FINDINGS   Left Ventricle: Left ventricular ejection fraction, by estimation, is 45  to 50%. The left ventricle has mildly decreased function. The left  ventricle demonstrates global hypokinesis. The left ventricular internal  cavity  size was normal in size. There is   no left ventricular hypertrophy. Left ventricular diastolic parameters  are consistent with Grade I diastolic dysfunction (impaired relaxation).   Right Ventricle: The right ventricular size is normal. No increase in  right ventricular wall thickness. Right ventricular systolic function is  normal. There is normal pulmonary artery systolic pressure. The tricuspid  regurgitant velocity is 1.89 m/s, and   with an assumed right atrial pressure of 3 mmHg, the estimated right  ventricular systolic pressure is 17.3 mmHg.   Left Atrium: Left atrial size was normal in size.   Right Atrium: Right atrial size was normal in size.   Pericardium: There is no evidence of pericardial effusion.   Mitral Valve: The mitral valve is normal in structure. Moderate mitral  annular calcification. No evidence of mitral valve regurgitation. No  evidence of mitral valve stenosis.   Tricuspid Valve: The tricuspid valve is normal in structure. Tricuspid  valve regurgitation is not demonstrated. No evidence of tricuspid  stenosis.   Aortic Valve: The aortic valve is normal in structure. There is severe  calcifcation of the aortic valve. There is severe thickening of the aortic  valve. Aortic valve regurgitation is not visualized. Mild aortic stenosis  is present. Aortic valve mean  gradient measures 13.0 mmHg. Aortic valve peak gradient measures 22.8  mmHg. Aortic valve area, by VTI measures 1.53 cm.   Pulmonic Valve: The pulmonic valve was normal in structure. Pulmonic valve  regurgitation is mild. No evidence of pulmonic stenosis.   Aorta: Aortic dilatation noted. There is mild dilatation of the ascending  aorta, measuring 41 mm.   Venous: The inferior vena cava is normal in size with greater than 50%  respiratory variability, suggesting right atrial pressure of 3 mmHg.   IAS/Shunts: No atrial level shunt detected by color flow Doppler.    Patient Profile      77 y.o. 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 Ella Jubilee.   Assessment & Plan    HFrEF -- presented with signs of hypervolemia on admission with complaints of DOE and has been diuresed.  Subsequent echocardiogram on 09/14/2022 showed decline LVEF of 40 to 45% with new regional wall motion abnormalities in the left ventricular, basal-mid anteroseptal wall, inferoseptal wall and anterior wall. -- Patient has also stated a significant and sudden decline in functional capacity as well worsening symptoms of shortness of breath, orthopnea, peripheral edema.  -- unclear etiology for decline in LVEF at this time, but he is not a candidate for invasive work presently --GDMT: has been limited in the setting of hypotension and elevated  CR. Continue to hold jardiance, losartan, and spiro with elevated Cr. Will add low dose beta blocker   CAD s/p CABG 1992 PAD s/p AO-bifem 2005 -- echocardiogram on 09/14/2022 indicating a reduction in LVEF of 40 to 45% with new regional wall motion abnormalities in the left ventricular, basal-mid anteroseptal wall, inferoseptal wall and anterior wall.  -- currently not appropriate candidate for invasive work up -- continue asprin 81mg  daily, zetia 10mg  daily, and zocor 40mg  daily   AKI on CKD stage IIIa -- creatinine 1.79>>1.9>>1.65 -- continue to hold diuresis -- will stop IVFs this morning given improvement in renal function   HLD --continue zertia and zocor as above   MSSA bacteremia Right knee infection -- ID following as well as ortho -- on cefazolin -- TTE without acute findings on valvular structures, will need TEE but schedule is full  until 5/28 -- ortho with recs for surgery/wash out   Hypokalemia -- K+ 3.2, supplement  For questions or updates, please contact [facility] Please consult [email or web address] for contact info under        Signed, Laverda Page, NP  09/17/2022, 9:04 AM    Agree with note by Laverda Page NP-C  Cardiology stable.  On appropriate medications for new LV dysfunction.  Denies chest pain or shortness of breath.  On IV antibiotics for M SSA bacteremia with right knee infection.  Ortho and ID following.  Suspect he should have his right knee prosthesis removed to be able to clear infection and bacteremia but I will leave that up to Ortho and ID.  Will schedule for TEE next week to rule out SBE.  He will need a PICC line and prolonged outpatient IV antibiotics.  Will recheck 2D echo in approximately 3 months.   Runell Gess, M.D., FACP, [facility], [facility] [address] [place], [place]  [address]  [phone] 09/17/2022 10:29 AM

## 2022-09-17 NOTE — Progress Notes (Addendum)
I have seen and examined Johnny [name] earlier this evening. I discussed the serious nature of his infection with him and his daughter and recommended an irrigation and debridement right knee with polyethylene exchange. We discussed this in detail and he elected to proceed with the surgery. Will plan  on surgery tomorrow afternoon  Subjective:    Johnny [name] is a 77 y/o male s/p right TKA from 2012, admitted to the hospital six days ago with acute CHF exacerbation and sepsis. Ortho was consulted due to new onset right knee pain a few days into admission. Found to have periprosthetic joint infection with gram positive cocci in synovial fluid aspirate. Original surgery was performed by Johnny [name].   Upon rounds today, he reports right knee pain and swelling that is worsened with weightbearing.  Objective: Vital signs in last 24 hours: Temp:  [97.6 F (36.4 C)-98.2 F (36.8 C)] 97.6 F (36.4 C) (05/23 0421) Pulse Rate:  [78-83] 83 (05/23 0421) Resp:  [20] 20 (05/23 0421) BP: (120-127)/(64-69) 120/69 (05/23 0421) SpO2:  [94 %-95 %] 94 % (05/23 0421) Weight:  [90.7 kg] 90.7 kg (05/23 0643)  Intake/Output from previous day:  Intake/Output Summary (Last 24 hours) at 09/17/2022 0805 Last data filed at 09/17/2022 6440 [name] per 24 hour  Intake 1581.5 ml  Output 400 ml  Net 1181.5 ml    Intake/Output this shift: No intake/output data recorded.  Labs: Recent Labs    09/15/22 1543 09/16/22 0204 09/17/22 0312  HGB 10.6* 10.0* 10.3*   Recent Labs    09/16/22 0204 09/17/22 0312  WBC 18.7* 17.8*  RBC 3.04* 3.05*  HCT 30.3* 29.8*  PLT 364 420*   Recent Labs    09/16/22 0204 09/17/22 0312  NA 133* 134*  K 3.7 3.2*  CL 94* 97*  CO2 27 22  BUN 44* 40*  CREATININE 1.90* 1.65*  GLUCOSE 142* 150*  CALCIUM 8.7* 9.2    Exam: General - Patient is Alert and Oriented  Extremity -  Well healed arthroplasty scar. Mild effusion with warmth to the skin. No erythema.  AROM 15-95  degrees with pain in active flexion  Motor Function - intact, moving foot and toes well on exam.   Past Medical History:  Diagnosis Date   Abdominal aortic aneurysm (HCC)    Cardiac murmur    Coronary artery disease    Heart attack (HCC)    Hemorrhoids    Occasional bleeding    Hiatal hernia    History of myocardial infarction 1992   History of rheumatic fever    Hyperlipidemia    Hypertension    Obstructive sleep apnea    Osteoarthritis of knee    Right knee   Peripheral arterial disease (HCC)    status post aortobifemoral bypass grafting by Dr. Hart Rochester in 2005   Sleep apnea     Assessment/Plan:    Principal Problem:   Septic joint of right knee joint (HCC) Active Problems:   Coronary artery disease   Essential hypertension   Hyperlipidemia   Obstructive sleep apnea   Peripheral arterial disease (HCC)   Acute on chronic diastolic CHF (congestive heart failure) (HCC)   Class 1 obesity   CKD stage 3a, GFR 45-59 ml/min (HCC)   Malnutrition of moderate degree   MSSA bacteremia  Estimated body mass index is 31.32 kg/m as calculated from the following:   Height as of this encounter: 5\' 7"  (1.702 m).   Weight as of this encounter: 90.7 kg.  Periprosthetic joint infection, right knee  Discussed with patient today that the routine treatment for an acute PJI would be arthrotomy with irrigation, debridement and polyethylene exchange. This would obviously be dependent upon clearance from the hospitalists and cardiologists on his ability to undergo anesthesia. He is visibly sad and deflated this AM. States he does not feel that he can handle any surgery due to his generalized weakness. His wife has dementia, and he is mainly focused on getting back home to help take care of her.   Spoke with Dr. Lequita Halt about patient's case. He plans to come see Johnny [name] at lunch or later this evening after clinic to discuss the potential surgery further, as well as the possible complications  from deferring this.   Arther Abbott, PA-C Orthopedic Surgery [phone] 09/17/2022, 8:05 AM

## 2022-09-17 NOTE — Progress Notes (Signed)
Patient states he's seeing bugs flying around the room, constantly trying to stand up on his own despite being unable to do so without two people and his walker. His knee is unable to hold him. He also states he's needing to have a bowel movement, placed on the bedside commode multiple times. He's asked staff to "carry him to the bathroom" refusing to use the bedside commode. He continues to try and get up on his own. He also has started to yell into the hallway. He claims there's grease under the bed and asks about the microwave in his kitchen and for the nurse to get his Tylenol from his kitchen. To avoid having to sit in front of his room an order was placed for a telesitter.

## 2022-09-17 NOTE — H&P (View-Only) (Signed)
I have seen and examined Johnny [name] earlier this evening. I discussed the serious nature of his infection with him and his daughter and recommended an irrigation and debridement right knee with polyethylene exchange. We discussed this in detail and he elected to proceed with the surgery. Will plan  on surgery tomorrow afternoon  Subjective:    Johnny. Johnny [name] is a 77 y/o male s/p right TKA from 2012, admitted to the hospital six days ago with acute CHF exacerbation and sepsis. Ortho was consulted due to new onset right knee pain a few days into admission. Found to have periprosthetic joint infection with gram positive cocci in synovial fluid aspirate. Original surgery was performed by Olman Yono.   Upon rounds today, he reports right knee pain and swelling that is worsened with weightbearing.  Objective: Vital signs in last 24 hours: Temp:  [97.6 F (36.4 C)-98.2 F (36.8 C)] 97.6 F (36.4 C) (05/23 0421) Pulse Rate:  [78-83] 83 (05/23 0421) Resp:  [20] 20 (05/23 0421) BP: (120-127)/(64-69) 120/69 (05/23 0421) SpO2:  [94 %-95 %] 94 % (05/23 0421) Weight:  [90.7 kg] 90.7 kg (05/23 0643)  Intake/Output from previous day:  Intake/Output Summary (Last 24 hours) at 09/17/2022 0805 Last data filed at 09/17/2022 0652 Gross per 24 hour  Intake 1581.5 ml  Output 400 ml  Net 1181.5 ml    Intake/Output this shift: No intake/output data recorded.  Labs: Recent Labs    09/15/22 1543 09/16/22 0204 09/17/22 0312  HGB 10.6* 10.0* 10.3*   Recent Labs    09/16/22 0204 09/17/22 0312  WBC 18.7* 17.8*  RBC 3.04* 3.05*  HCT 30.3* 29.8*  PLT 364 420*   Recent Labs    09/16/22 0204 09/17/22 0312  NA 133* 134*  K 3.7 3.2*  CL 94* 97*  CO2 27 22  BUN 44* 40*  CREATININE 1.90* 1.65*  GLUCOSE 142* 150*  CALCIUM 8.7* 9.2    Exam: General - Patient is Alert and Oriented  Extremity -  Well healed arthroplasty scar. Mild effusion with warmth to the skin. No erythema.  AROM 15-95  degrees with pain in active flexion  Motor Function - intact, moving foot and toes well on exam.   Past Medical History:  Diagnosis Date   Abdominal aortic aneurysm (HCC)    Cardiac murmur    Coronary artery disease    Heart attack (HCC)    Hemorrhoids    Occasional bleeding    Hiatal hernia    History of myocardial infarction 1992   History of rheumatic fever    Hyperlipidemia    Hypertension    Obstructive sleep apnea    Osteoarthritis of knee    Right knee   Peripheral arterial disease (HCC)    status post aortobifemoral bypass grafting by Dr. Lawson in 2005   Sleep apnea     Assessment/Plan:    Principal Problem:   Septic joint of right knee joint (HCC) Active Problems:   Coronary artery disease   Essential hypertension   Hyperlipidemia   Obstructive sleep apnea   Peripheral arterial disease (HCC)   Acute on chronic diastolic CHF (congestive heart failure) (HCC)   Class 1 obesity   CKD stage 3a, GFR 45-59 ml/min (HCC)   Malnutrition of moderate degree   MSSA bacteremia  Estimated body mass index is 31.32 kg/m as calculated from the following:   Height as of this encounter: 5' 7" (1.702 m).   Weight as of this encounter: 90.7 kg.     Periprosthetic joint infection, right knee  Discussed with patient today that the routine treatment for an acute PJI would be arthrotomy with irrigation, debridement and polyethylene exchange. This would obviously be dependent upon clearance from the hospitalists and cardiologists on his ability to undergo anesthesia. He is visibly sad and deflated this AM. States he does not feel that he can handle any surgery due to his generalized weakness. His wife has dementia, and he is mainly focused on getting back home to help take care of her.   Spoke with Dr. Mansel Strother about patient's case. He plans to come see Johnny. Konz at lunch or later this evening after clinic to discuss the potential surgery further, as well as the possible complications  from deferring this.   Kristie Edmisten, PA-C Orthopedic Surgery [phone] 09/17/2022, 8:05 AM  

## 2022-09-17 NOTE — Progress Notes (Signed)
Progress Note   Patient: Johnny [name]:[id number] DOB: August 24, 1945 DOA: 09/12/2022     4 DOS: the patient was seen and examined on 09/17/2022   Brief hospital course: Mr. Barb was admitted to the hospital with the working diagnosis of heart failure exacerbation.   76 yo male with the past medical history of coronary artery disease, sp CABG, abdominal aortic aneurysm, hypertension, obesity and dyslipidemia who presented with dyspnea. Reported progressive lower and upper extremity edema, along with dyspnea. On his initial physical examination his blood pressure was 130/69, HR 61m RR 16 and 02 saturation 97%, heart with S1 and S2 present and rhythmic, lungs with no wheezing or rales, abdomen with no distention, positive lower extremity edema.   Na 131, K 3,2 Cl 93, bicarbonate 24, glucose 143, bun 24, cr 1.29  Mg 1,7  High sensitive troponin 183 and 150  Wbc 13,4 hgb 10.0 plt 244  Sars covid 19 negative   Chest radiograph with cardiomegaly, bilateral hilar vascular congestion, bilateral atelectasis at bases with no effusions. Sternotomy wires in place.   EKG 75 bpm, normal axis, normal intervals, sinus rhythm with no significant ST segment, negative T wave lead V1 and V2. (Old changes).   05/ 20 patient has responded well to diuresis. Possible discharge tomorrow.  Check limited echocardiogram for elevated troponin.  05/21 echocardiogram with mild reduction in LV systolic function and positive new wall motion abnormalities.  05/22 positive blood culture for MSSA, ID and orthopedics have been formally consulted.  05/23 right knee arthrocentesis consistent with septic joint. Pending TEE.   Assessment and Plan: * Septic joint of right knee joint (HCC) Sepsis not present on admission, severe in intensity with AKI, end organ damage.   Positive blood culture to MSSA.  Right knee synovial fluid with 96,500 wbc with 88 PMN. Fluid positive for staphylococcus aureus.   Plan to continue  antibiotic therapy with IV cefazolin.  Pending TEE to rule out endocarditis.  Source probably skin, severe pruritus and scratching.   Acute on chronic diastolic CHF (congestive heart failure) (HCC) Limited echocardiogram with LV systolic function mildly reduced to 40 to 45%, hypokinesis of the basal-mid antero septal wall, inferoseptal wall and anterior wall.   Volume status has improved.     Continue to hold antihypertensive medications and diuretics, due to worsening renal function.   Considering acute infection, no invasive testing recommended.  Possible sepsis related decreased LV systolic function.  Follow TEE to rule out endocarditis.   Essential hypertension Hold on antihypertensive medications.    Coronary artery disease Patient had decreased energy for 24 hrs prior to admission.  Old records personally reviewed, patient had CABG in 1992 and non ischemic Myoview in 2012.  He has not had frank angina.   Continue aspirin and increase dose of statin.   CKD stage 3a, GFR 45-59 ml/min (HCC) AKI, hyponatremia.   Renal function today with serum cr at 1,65, K is 3,2 and serum bicarbonate at 22.  Na 134.    IV fluids have been stopped this morning.  Added 60 kcl po.  Check renal function and electrolytes in am.   Peripheral arterial disease (HCC) Continue with aspirin and statin therapy.   Patient had aortobifemoral bypass in 2005. Has severe SFA disease bilaterally with ABIs in 0,87 range.   Hyperlipidemia Continue with ezetimibe and simvastatin (increase dose to 40 mg).   Obstructive sleep apnea C pap.   Class 1 obesity Calculated BMI is 31.6  Malnutrition of moderate degree  Continue nutritional supplements.         Subjective: Patient with no chest pain or dyspnea, continue to have right knee pain, worse with movement.   Physical Exam: Vitals:   09/16/22 1310 09/16/22 2003 09/17/22 0421 09/17/22 0643  BP: 121/64 127/67 120/69   Pulse:   83   Resp:   20 20   Temp: 97.8 F (36.6 C) 98.2 F (36.8 C) 97.6 F (36.4 C)   TempSrc: Oral Oral Oral   SpO2:   94%   Weight:    90.7 kg  Height:       Neurology awake and alert ENT with mild pallor Cardiovascular with S1 and S2 present and rhythmic with no gallops, or rubs No JVD No lower extremity edema Respiratory with no rales or wheezing, no rhonchi Abdomen with no distention  Data Reviewed:    Family Communication: no family at the bedside   Disposition: Status is: Inpatient Remains inpatient appropriate because: IV antibiotics, will need TEE   Planned Discharge Destination: Home     Author: Coralie Keens, MD 09/17/2022 3:13 PM  For on call review [email or web address].

## 2022-09-17 NOTE — Progress Notes (Signed)
   Heart Failure Stewardship Pharmacist Progress Note   PCP: Daisy Floro, MD PCP-Cardiologist: Nanetta Batty, MD    HPI:  77 yo M with PMH of CAD s/p CABG, PAD, aortic stenosis, HTN, AAA, and HLD.   Presented to the ED on 5/18 with swelling to hands and lower extremities, generalized weakness, shortness of breath, and ongoing pruritus. BNP 131 and troponin peak 183. CXR without pleural effusion or edema. ECHO 5/20 showed LVEF reduced to 40-45%, regional wall motion abnormalities, and hypokinesis of left ventricular, basal-mid anteroseptal wall, inferoseptal wall and anterior wall.  Patient also reported R knee pain and appeared more confused. Found to have high grade MSSA bacteremia. ID consulted. Ortho recommending knee surgery. Plan TEE next week to rule out endocarditis.   Current HF Medications: Beta Blocker: metoprolol tartrate 12.5 mg BID  Prior to admission HF Medications: Diuretic: furosemide 20 mg daily ACE/ARB/ARNI: olmesartan 40 mg daily *also takes HCTZ 25 mg daily  Pertinent Lab Values: Serum creatinine 1.65, BUN 40, Potassium 3.2, Sodium 134, BNP 131.6, Magnesium 1.8, A1c 5.8   Vital Signs: Weight: 200 lbs (admission weight: 202 lbs) Blood pressure: 120/60s Heart rate: 80-90s  I/O: incomplete yesterday; net -0.5L  Medication Assistance / Insurance Benefits Check: Does the patient have prescription insurance?  Yes Type of insurance plan: UHC Medicare  Does the patient qualify for medication assistance through manufacturers or grants?   Pending Eligible grants and/or patient assistance programs: pending Medication assistance applications in progress: none  Medication assistance applications approved: none Approved medication assistance renewals will be completed by: pending  Outpatient Pharmacy:  Prior to admission outpatient [facility] Is the patient willing to use [facility] TOC pharmacy at discharge? Yes Is the patient willing to transition their  outpatient pharmacy to utilize a [facility] outpatient pharmacy?   Pending    Assessment: 1. Acute systolic CHF (LVEF 40-45%), not currently a candidate for ischemic evaluation. NYHA class II symptoms. - SCr 1.90>1.65 today (BL ~1.3). Strict I/Os and daily weights. Keep K>4 and Mg>2. KCl 60 mEq x 1 given for replacement.  - Does not appear volume overloaded on exam, no indication for diuretics - Agree with adding metoprolol tartrate 25 mg BID - No ARB/ARNI, MRA, or SGLT2i with AKI   Plan: 1) Medication changes recommended at this time: - Agree with changes, monitor for renal improvement  2) Patient assistance: - Jardiance/Farxiga copay $47 - Entresot copay $47  3)  Education  - To be completed prior to discharge  Sharen Hones, PharmD, BCPS Heart Failure Engineer, building services Phone [phone]

## 2022-09-17 NOTE — Progress Notes (Signed)
[facility]  Date of Admission:  09/12/2022           Reason for visit: Follow up on MSSA bacteremia  Current antibiotics: Cefazolin  ASSESSMENT:    77 y.o. male admitted with:  MSSA bacteremia: Patient presented 09/12/22.  Noted to have low-grade fever, leukocytosis, and new onset CHF.  Blood cultures checked 09/15/22 and found to have high-grade MSSA bacteremia.  Concerning for infective endocarditis given subacute symptomatology.  TTE negative for obvious vegetation.  TEE pending.  Source suspected to be due to skin breakdown from scratching. Right knee PJI: Patient also noted to have right knee pain with prior TKA.  Aspiration performed consistent with septic arthritis (96,500 WBC, GPC on Gram stain, Cultures with staph aureus).  Seen by orthopedics and pending formal input from his surgeon. CKD3: Creatinine appears stable. CAD/PAD: Follows with Dr Allyson Sabal.  RECOMMENDATIONS:    Continue cefazolin Repeat blood cultures tomorrow morning Appreciate orthopedics input on right knee PJI.  Anticipate plan for surgical intervention if patient agreeable Needs TEE Following   Principal Problem:   Septic joint of right knee joint (HCC) Active Problems:   Coronary artery disease   Essential hypertension   Hyperlipidemia   Obstructive sleep apnea   Peripheral arterial disease (HCC)   Acute on chronic diastolic CHF (congestive heart failure) (HCC)   Class 1 obesity   CKD stage 3a, GFR 45-59 ml/min (HCC)   Malnutrition of moderate degree   MSSA bacteremia    MEDICATIONS:    Scheduled Meds:  aspirin EC  81 mg Oral Daily   diclofenac Sodium  4 g Topical QID   ezetimibe  10 mg Oral Daily   famotidine  40 mg Oral Daily   feeding supplement  237 mL Oral TID BM   insulin aspart  0-5 Units Subcutaneous QHS   insulin aspart  0-9 Units Subcutaneous TID WC   metoprolol tartrate  12.5 mg Oral BID   multivitamin with minerals  1 tablet Oral Daily    simvastatin  40 mg Oral Daily   Continuous Infusions:   ceFAZolin (ANCEF) IV 2 g (09/17/22 0836)   thiamine (VITAMIN B1) injection Stopped (09/16/22 1809)   PRN Meds:.acetaminophen, alum & mag hydroxide-simeth, diphenhydrAMINE, melatonin, morphine injection, polyethylene glycol, traMADol  SUBJECTIVE:   24 hour events:  No acute events Aspiration c/w PJI Seen by ortho Feeling down today, more withdrawn, frustrated.   Review of Systems  All other systems reviewed and are negative.     OBJECTIVE:   Blood pressure 120/69, pulse 83, temperature 97.6 F (36.4 C), temperature source Oral, resp. rate 20, height 5\' 7"  (1.702 m), weight 90.7 kg, SpO2 94 %. Body mass index is 31.32 kg/m.  Physical Exam Constitutional:      General: He is not in acute distress. HENT:     Head: Normocephalic and atraumatic.  Eyes:     Extraocular Movements: Extraocular movements intact.     Conjunctiva/sclera: Conjunctivae normal.  Pulmonary:     Effort: Pulmonary effort is normal. No respiratory distress.  Abdominal:     General: There is no distension.     Palpations: Abdomen is soft.  Musculoskeletal:     Cervical back: Normal range of motion and neck supple.     Comments: Right knee scar healed.  He was unable to bend his knee.  All the motion was originated from his hip.  Skin:    General: Skin is warm and dry.  Findings: Bruising present.     Comments: Multiple bruising sites noted.   Neurological:     General: No focal deficit present.     Mental Status: He is alert and oriented to person, place, and time.  Psychiatric:        Mood and Affect: Mood normal.        Behavior: Behavior normal.      Lab Results: Lab Results  Component Value Date   WBC 17.8 (H) 09/17/2022   HGB 10.3 (L) 09/17/2022   HCT 29.8 (L) 09/17/2022   MCV 97.7 09/17/2022   PLT 420 (H) 09/17/2022    Lab Results  Component Value Date   NA 134 (L) 09/17/2022   K 3.2 (L) 09/17/2022   CO2 22 09/17/2022    GLUCOSE 150 (H) 09/17/2022   BUN 40 (H) 09/17/2022   CREATININE 1.65 (H) 09/17/2022   CALCIUM 9.2 09/17/2022   GFRNONAA 42 (L) 09/17/2022   GFRAA 54 (L) 06/04/2020    Lab Results  Component Value Date   ALT 25 09/12/2022   AST 31 09/12/2022   ALKPHOS 81 09/12/2022   BILITOT 0.9 09/12/2022       Component Value Date/Time   CRP <0.5 07/14/2022 1239       Component Value Date/Time   ESRSEDRATE 37 (H) 07/14/2022 1240     I have reviewed the micro and lab results in Epic.  Imaging: ECHOCARDIOGRAM COMPLETE  Result Date: 09/16/2022    ECHOCARDIOGRAM REPORT   Patient Name:   Johnny [name] Date of Exam: 09/16/2022 Medical Rec #:  [id number]       Height:       67.0 in Accession #:    [id number]      Weight:       195.6 lb Date of Birth:  Aug 08, 1945       BSA:          2.003 m Patient Age:    89 years        BP:           121/64 mmHg Patient Gender: M               HR:           73 bpm. Exam Location:  Inpatient Procedure: 2D Echo, Color Doppler and Cardiac Doppler Indications:    congestive heart failure  History:        Patient has prior history of Echocardiogram examinations, most                 recent 09/14/2022. CAD, PAD and chronic kidney disease,                 Signs/Symptoms:Bacteremia, Dyspnea and Edema; Risk Factors:Sleep                 Apnea, Hypertension and Dyslipidemia.  Sonographer:    Delcie Roch RDCS Referring Phys: [id number] MAURICIO DANIEL ARRIEN IMPRESSIONS  1. Left ventricular ejection fraction, by estimation, is 45 to 50%. The left ventricle has mildly decreased function. The left ventricle demonstrates global hypokinesis. Left ventricular diastolic parameters are consistent with Grade I diastolic dysfunction (impaired relaxation).  2. Right ventricular systolic function is normal. The right ventricular size is normal. There is normal pulmonary artery systolic pressure.  3. The mitral valve is normal in structure. No evidence of mitral valve regurgitation. No  evidence of mitral stenosis. Moderate mitral annular calcification.  4. The aortic valve is normal in structure. There is  severe calcifcation of the aortic valve. There is severe thickening of the aortic valve. Aortic valve regurgitation is not visualized. Mild aortic valve stenosis. Aortic valve mean gradient measures 13.0 mmHg. Aortic valve Vmax measures 2.39 m/s.  5. Aortic dilatation noted. There is mild dilatation of the ascending aorta, measuring 41 mm.  6. The inferior vena cava is normal in size with greater than 50% respiratory variability, suggesting right atrial pressure of 3 mmHg. Comparison(s): No significant change from prior study. Prior images reviewed side by side. FINDINGS  Left Ventricle: Left ventricular ejection fraction, by estimation, is 45 to 50%. The left ventricle has mildly decreased function. The left ventricle demonstrates global hypokinesis. The left ventricular internal cavity size was normal in size. There is  no left ventricular hypertrophy. Left ventricular diastolic parameters are consistent with Grade I diastolic dysfunction (impaired relaxation). Right Ventricle: The right ventricular size is normal. No increase in right ventricular wall thickness. Right ventricular systolic function is normal. There is normal pulmonary artery systolic pressure. The tricuspid regurgitant velocity is 1.89 m/s, and  with an assumed right atrial pressure of 3 mmHg, the estimated right ventricular systolic pressure is 17.3 mmHg. Left Atrium: Left atrial size was normal in size. Right Atrium: Right atrial size was normal in size. Pericardium: There is no evidence of pericardial effusion. Mitral Valve: The mitral valve is normal in structure. Moderate mitral annular calcification. No evidence of mitral valve regurgitation. No evidence of mitral valve stenosis. Tricuspid Valve: The tricuspid valve is normal in structure. Tricuspid valve regurgitation is not demonstrated. No evidence of tricuspid  stenosis. Aortic Valve: The aortic valve is normal in structure. There is severe calcifcation of the aortic valve. There is severe thickening of the aortic valve. Aortic valve regurgitation is not visualized. Mild aortic stenosis is present. Aortic valve mean gradient measures 13.0 mmHg. Aortic valve peak gradient measures 22.8 mmHg. Aortic valve area, by VTI measures 1.53 cm. Pulmonic Valve: The pulmonic valve was normal in structure. Pulmonic valve regurgitation is mild. No evidence of pulmonic stenosis. Aorta: Aortic dilatation noted. There is mild dilatation of the ascending aorta, measuring 41 mm. Venous: The inferior vena cava is normal in size with greater than 50% respiratory variability, suggesting right atrial pressure of 3 mmHg. IAS/Shunts: No atrial level shunt detected by color flow Doppler.  LEFT VENTRICLE PLAX 2D LVIDd:         4.70 cm      Diastology LVIDs:         3.60 cm      LV e' medial:    7.30 cm/s LV PW:         1.20 cm      LV E/e' medial:  10.7 LV IVS:        1.20 cm      LV e' lateral:   8.86 cm/s LVOT diam:     2.10 cm      LV E/e' lateral: 8.8 LV SV:         69 LV SV Index:   34 LVOT Area:     3.46 cm  LV Volumes (MOD) LV vol d, MOD A2C: 120.0 ml LV vol s, MOD A2C: 61.7 ml LV SV MOD A2C:     58.3 ml RIGHT VENTRICLE             IVC RV Basal diam:  2.50 cm     IVC diam: 1.50 cm RV S prime:     12.30 cm/s TAPSE (M-mode): 1.0 cm  LEFT ATRIUM             Index        RIGHT ATRIUM           Index LA diam:        3.60 cm 1.80 cm/m   RA Area:     13.60 cm LA Vol (A2C):   37.8 ml 18.87 ml/m  RA Volume:   28.60 ml  14.28 ml/m LA Vol (A4C):   36.1 ml 18.02 ml/m LA Biplane Vol: 38.3 ml 19.12 ml/m  AORTIC VALVE AV Area (Vmax):    1.52 cm AV Area (Vmean):   1.46 cm AV Area (VTI):     1.53 cm AV Vmax:           239.00 cm/s AV Vmean:          165.000 cm/s AV VTI:            0.451 m AV Peak Grad:      22.8 mmHg AV Mean Grad:      13.0 mmHg LVOT Vmax:         105.00 cm/s LVOT Vmean:        69.500  cm/s LVOT VTI:          0.200 m LVOT/AV VTI ratio: 0.44  AORTA Ao Root diam: 3.60 cm Ao Asc diam:  4.10 cm MITRAL VALVE               TRICUSPID VALVE MV Area (PHT): 3.53 cm    TR Peak grad:   14.3 mmHg MV Decel Time: 215 msec    TR Vmax:        189.00 cm/s MV E velocity: 77.80 cm/s MV A velocity: 90.00 cm/s  SHUNTS MV E/A ratio:  0.86        Systemic VTI:  0.20 m                            Systemic Diam: 2.10 cm Donato Schultz MD Electronically signed by Donato Schultz MD Signature Date/Time: 09/16/2022/3:42:34 PM    Final    DG Knee 1-2 Views Right  Result Date: 09/15/2022 CLINICAL DATA:  Right medial knee pain for 2 days EXAM: RIGHT KNEE - 1-2 VIEW COMPARISON:  01/09/2004 FINDINGS: Heavy atherosclerotic arterial vascular calcification. Small knee effusion. Lucency underlying the tibial tray along the medial plateau on the frontal projection (zones 1 and 2) and to a lesser extent along the anterior plateau of the tibial component on the lateral projection (zone 1) and along the posterior margin of the posterior tibial plateau on the lateral projection (zone 2). This can have an association with aseptic loosening. IMPRESSION: 1. Lucency underlying the portions of the tibial tray, which can have an association with aseptic loosening. 2. Small knee effusion. 3. Heavy atherosclerotic arterial vascular calcification. Electronically Signed   By: Gaylyn Rong M.D.   On: 09/15/2022 15:44     Imaging independently reviewed in Epic.    Vedia Coffer for Infectious Disease [facility] [phone] pager 09/17/2022, 11:48 AM

## 2022-09-18 ENCOUNTER — Inpatient Hospital Stay (HOSPITAL_COMMUNITY): Payer: Medicare Other

## 2022-09-18 ENCOUNTER — Other Ambulatory Visit: Payer: Self-pay

## 2022-09-18 ENCOUNTER — Inpatient Hospital Stay (HOSPITAL_COMMUNITY): Payer: Medicare Other | Admitting: Anesthesiology

## 2022-09-18 ENCOUNTER — Encounter (HOSPITAL_COMMUNITY)
Admission: EM | Disposition: A | Discharge status: Hospice | Payer: Self-pay | Source: Home / Self Care | Attending: Internal Medicine

## 2022-09-18 ENCOUNTER — Encounter (HOSPITAL_COMMUNITY): Payer: Self-pay | Admitting: Internal Medicine

## 2022-09-18 DIAGNOSIS — N1831 Chronic kidney disease, stage 3a: Secondary | ICD-10-CM | POA: Diagnosis not present

## 2022-09-18 DIAGNOSIS — M009 Pyogenic arthritis, unspecified: Secondary | ICD-10-CM | POA: Diagnosis not present

## 2022-09-18 DIAGNOSIS — I519 Heart disease, unspecified: Secondary | ICD-10-CM

## 2022-09-18 DIAGNOSIS — I509 Heart failure, unspecified: Secondary | ICD-10-CM

## 2022-09-18 DIAGNOSIS — G9341 Metabolic encephalopathy: Secondary | ICD-10-CM

## 2022-09-18 DIAGNOSIS — I739 Peripheral vascular disease, unspecified: Secondary | ICD-10-CM

## 2022-09-18 DIAGNOSIS — E440 Moderate protein-calorie malnutrition: Secondary | ICD-10-CM

## 2022-09-18 DIAGNOSIS — I2510 Atherosclerotic heart disease of native coronary artery without angina pectoris: Secondary | ICD-10-CM | POA: Diagnosis not present

## 2022-09-18 DIAGNOSIS — I5033 Acute on chronic diastolic (congestive) heart failure: Secondary | ICD-10-CM | POA: Diagnosis not present

## 2022-09-18 DIAGNOSIS — I110 Hypertensive heart disease with heart failure: Secondary | ICD-10-CM

## 2022-09-18 DIAGNOSIS — I10 Essential (primary) hypertension: Secondary | ICD-10-CM | POA: Diagnosis not present

## 2022-09-18 DIAGNOSIS — Z87891 Personal history of nicotine dependence: Secondary | ICD-10-CM

## 2022-09-18 DIAGNOSIS — R7881 Bacteremia: Secondary | ICD-10-CM | POA: Diagnosis not present

## 2022-09-18 DIAGNOSIS — B9561 Methicillin susceptible Staphylococcus aureus infection as the cause of diseases classified elsewhere: Secondary | ICD-10-CM | POA: Diagnosis not present

## 2022-09-18 DIAGNOSIS — M00061 Staphylococcal arthritis, right knee: Secondary | ICD-10-CM | POA: Diagnosis not present

## 2022-09-18 DIAGNOSIS — Z951 Presence of aortocoronary bypass graft: Secondary | ICD-10-CM

## 2022-09-18 DIAGNOSIS — T8453XA Infection and inflammatory reaction due to internal right knee prosthesis, initial encounter: Secondary | ICD-10-CM | POA: Diagnosis not present

## 2022-09-18 DIAGNOSIS — G9340 Encephalopathy, unspecified: Secondary | ICD-10-CM

## 2022-09-18 HISTORY — PX: I & D KNEE WITH POLY EXCHANGE: SHX5024

## 2022-09-18 HISTORY — PX: TEE WITHOUT CARDIOVERSION: SHX5443

## 2022-09-18 LAB — CBC WITH DIFFERENTIAL/PLATELET
Abs Immature Granulocytes: 0.41 10*3/uL — ABNORMAL HIGH (ref 0.00–0.07)
Basophils Absolute: 0 10*3/uL (ref 0.0–0.1)
Basophils Relative: 0 %
Eosinophils Absolute: 0 10*3/uL (ref 0.0–0.5)
Eosinophils Relative: 0 %
HCT: 32.8 % — ABNORMAL LOW (ref 39.0–52.0)
Hemoglobin: 10.6 g/dL — ABNORMAL LOW (ref 13.0–17.0)
Immature Granulocytes: 2 %
Lymphocytes Relative: 5 %
Lymphs Abs: 0.9 10*3/uL (ref 0.7–4.0)
MCH: 31.9 pg (ref 26.0–34.0)
MCHC: 32.3 g/dL (ref 30.0–36.0)
MCV: 98.8 fL (ref 80.0–100.0)
Monocytes Absolute: 1 10*3/uL (ref 0.1–1.0)
Monocytes Relative: 5 %
Neutro Abs: 16.3 10*3/uL — ABNORMAL HIGH (ref 1.7–7.7)
Neutrophils Relative %: 88 %
Platelets: 471 10*3/uL — ABNORMAL HIGH (ref 150–400)
RBC: 3.32 MIL/uL — ABNORMAL LOW (ref 4.22–5.81)
RDW: 13.4 % (ref 11.5–15.5)
WBC: 18.6 10*3/uL — ABNORMAL HIGH (ref 4.0–10.5)
nRBC: 0 % (ref 0.0–0.2)

## 2022-09-18 LAB — CBC
HCT: 31.5 % — ABNORMAL LOW (ref 39.0–52.0)
Hemoglobin: 10.4 g/dL — ABNORMAL LOW (ref 13.0–17.0)
MCH: 33.3 pg (ref 26.0–34.0)
MCHC: 33 g/dL (ref 30.0–36.0)
MCV: 101 fL — ABNORMAL HIGH (ref 80.0–100.0)
Platelets: 439 10*3/uL — ABNORMAL HIGH (ref 150–400)
RBC: 3.12 MIL/uL — ABNORMAL LOW (ref 4.22–5.81)
RDW: 13.6 % (ref 11.5–15.5)
WBC: 19.2 10*3/uL — ABNORMAL HIGH (ref 4.0–10.5)
nRBC: 0 % (ref 0.0–0.2)

## 2022-09-18 LAB — CREATININE, SERUM
Creatinine, Ser: 1.34 mg/dL — ABNORMAL HIGH (ref 0.61–1.24)
GFR, Estimated: 54 mL/min — ABNORMAL LOW (ref >60)

## 2022-09-18 LAB — CULTURE, BLOOD (ROUTINE X 2)

## 2022-09-18 LAB — BODY FLUID CULTURE W GRAM STAIN

## 2022-09-18 LAB — BASIC METABOLIC PANEL
Anion gap: 15 (ref 5–15)
BUN: 33 mg/dL — ABNORMAL HIGH (ref 8–23)
CO2: 22 mmol/L (ref 22–32)
Calcium: 9.7 mg/dL (ref 8.9–10.3)
Chloride: 96 mmol/L — ABNORMAL LOW (ref 98–111)
Creatinine, Ser: 1.43 mg/dL — ABNORMAL HIGH (ref 0.61–1.24)
GFR, Estimated: 50 mL/min — ABNORMAL LOW (ref >60)
Glucose, Bld: 152 mg/dL — ABNORMAL HIGH (ref 70–99)
Potassium: 3.6 mmol/L (ref 3.5–5.1)
Sodium: 133 mmol/L — ABNORMAL LOW (ref 135–145)

## 2022-09-18 LAB — GLUCOSE, CAPILLARY
Glucose-Capillary: 159 mg/dL — ABNORMAL HIGH (ref 70–99)
Glucose-Capillary: 114 mg/dL — ABNORMAL HIGH (ref 70–99)
Glucose-Capillary: 143 mg/dL — ABNORMAL HIGH (ref 70–99)
Glucose-Capillary: 171 mg/dL — ABNORMAL HIGH (ref 70–99)

## 2022-09-18 LAB — ECHO TEE

## 2022-09-18 SURGERY — IRRIGATION AND DEBRIDEMENT KNEE WITH POLY EXCHANGE
Anesthesia: General | Site: Knee | Laterality: Right

## 2022-09-18 MED ORDER — DOCUSATE SODIUM 100 MG PO CAPS
100.0000 mg | ORAL_CAPSULE | Freq: Two times a day (BID) | ORAL | Status: DC
  Administered 2022-09-18 – 2022-09-24 (×12): 100 mg via ORAL
  Filled 2022-09-18 (×12): qty 1

## 2022-09-18 MED ORDER — PHENOL 1.4 % MT LIQD: 1.0000 | OROMUCOSAL | Status: DC | PRN

## 2022-09-18 MED ORDER — ROCURONIUM BROMIDE 10 MG/ML (PF) SYRINGE
PREFILLED_SYRINGE | INTRAVENOUS | Status: DC | PRN
  Administered 2022-09-18: 70 mg via INTRAVENOUS

## 2022-09-18 MED ORDER — AMISULPRIDE (ANTIEMETIC) 5 MG/2ML IV SOLN: 10.0000 mg | Freq: Once | INTRAVENOUS | Status: DC | PRN

## 2022-09-18 MED ORDER — METOCLOPRAMIDE HCL 5 MG/ML IJ SOLN: 5.0000 mg | Freq: Three times a day (TID) | INTRAMUSCULAR | Status: DC | PRN

## 2022-09-18 MED ORDER — SODIUM CHLORIDE 0.9 % IV SOLN: INTRAVENOUS | Status: DC

## 2022-09-18 MED ORDER — METOCLOPRAMIDE HCL 5 MG PO TABS: 5.0000 mg | ORAL_TABLET | Freq: Three times a day (TID) | ORAL | Status: DC | PRN

## 2022-09-18 MED ORDER — BISACODYL 10 MG RE SUPP: 10.0000 mg | Freq: Every day | RECTAL | Status: DC | PRN

## 2022-09-18 MED ORDER — HYDROMORPHONE HCL 1 MG/ML IJ SOLN
0.2500 mg | INTRAMUSCULAR | Status: DC | PRN
  Administered 2022-09-18: 0.5 mg via INTRAVENOUS

## 2022-09-18 MED ORDER — HYDROMORPHONE HCL 1 MG/ML IJ SOLN
INTRAMUSCULAR | Status: AC
  Filled 2022-09-18: qty 1

## 2022-09-18 MED ORDER — ONDANSETRON HCL 4 MG PO TABS: 4.0000 mg | ORAL_TABLET | Freq: Four times a day (QID) | ORAL | Status: DC | PRN

## 2022-09-18 MED ORDER — LACTATED RINGERS IV SOLN: INTRAVENOUS | Status: DC

## 2022-09-18 MED ORDER — CHLORHEXIDINE GLUCONATE 0.12 % MT SOLN: 15.0000 mL | Freq: Once | OROMUCOSAL | Status: AC

## 2022-09-18 MED ORDER — SUGAMMADEX SODIUM 200 MG/2ML IV SOLN
INTRAVENOUS | Status: DC | PRN
  Administered 2022-09-18: 200 mg via INTRAVENOUS

## 2022-09-18 MED ORDER — ACETAMINOPHEN 500 MG PO TABS
1000.0000 mg | ORAL_TABLET | Freq: Four times a day (QID) | ORAL | Status: AC
  Administered 2022-09-18 – 2022-09-19 (×4): 1000 mg via ORAL
  Filled 2022-09-18 (×4): qty 2

## 2022-09-18 MED ORDER — SODIUM CHLORIDE 0.9 % IV SOLN
12.0000 g | INTRAVENOUS | Status: DC
  Administered 2022-09-18 – 2022-09-21 (×4): 12 g via INTRAVENOUS
  Filled 2022-09-18 (×5): qty 48

## 2022-09-18 MED ORDER — METHOCARBAMOL 1000 MG/10ML IJ SOLN: 500.0000 mg | Freq: Four times a day (QID) | INTRAVENOUS | Status: DC | PRN

## 2022-09-18 MED ORDER — FENTANYL CITRATE (PF) 250 MCG/5ML IJ SOLN
INTRAMUSCULAR | Status: DC | PRN
  Administered 2022-09-18 (×2): 50 ug via INTRAVENOUS

## 2022-09-18 MED ORDER — PROMETHAZINE HCL 25 MG/ML IJ SOLN: 6.2500 mg | INTRAMUSCULAR | Status: DC | PRN

## 2022-09-18 MED ORDER — PROPOFOL 10 MG/ML IV BOLUS
INTRAVENOUS | Status: DC | PRN
  Administered 2022-09-18: 70 mg via INTRAVENOUS

## 2022-09-18 MED ORDER — SODIUM CHLORIDE 0.9 % IR SOLN
Status: DC | PRN
  Administered 2022-09-18 (×2): 3000 mL

## 2022-09-18 MED ORDER — 0.9 % SODIUM CHLORIDE (POUR BTL) OPTIME
TOPICAL | Status: DC | PRN
  Administered 2022-09-18: 1000 mL

## 2022-09-18 MED ORDER — ONDANSETRON HCL 4 MG/2ML IJ SOLN: 4.0000 mg | Freq: Four times a day (QID) | INTRAMUSCULAR | Status: DC | PRN

## 2022-09-18 MED ORDER — CHLORHEXIDINE GLUCONATE 0.12 % MT SOLN
OROMUCOSAL | Status: AC
  Filled 2022-09-18: qty 15

## 2022-09-18 MED ORDER — CEFAZOLIN SODIUM-DEXTROSE 2-4 GM/100ML-% IV SOLN
2.0000 g | INTRAVENOUS | Status: AC
  Administered 2022-09-18: 2 g via INTRAVENOUS
  Filled 2022-09-18: qty 100

## 2022-09-18 MED ORDER — HALOPERIDOL LACTATE 5 MG/ML IJ SOLN: 1.0000 mg | Freq: Four times a day (QID) | INTRAMUSCULAR | Status: DC | PRN

## 2022-09-18 MED ORDER — OXYCODONE HCL 5 MG PO TABS
5.0000 mg | ORAL_TABLET | ORAL | Status: DC | PRN
  Administered 2022-09-21 (×2): 10 mg via ORAL
  Administered 2022-09-21: 5 mg via ORAL
  Administered 2022-09-21 – 2022-09-22 (×2): 10 mg via ORAL
  Administered 2022-09-22 – 2022-09-23 (×4): 5 mg via ORAL
  Administered 2022-09-23 – 2022-09-24 (×3): 10 mg via ORAL
  Filled 2022-09-18: qty 1
  Filled 2022-09-18 (×4): qty 2
  Filled 2022-09-18 (×2): qty 1
  Filled 2022-09-18 (×2): qty 2
  Filled 2022-09-18: qty 1
  Filled 2022-09-18: qty 2
  Filled 2022-09-18: qty 1

## 2022-09-18 MED ORDER — ACETAMINOPHEN 325 MG PO TABS: 325.0000 mg | ORAL_TABLET | Freq: Once | ORAL | Status: DC | PRN

## 2022-09-18 MED ORDER — FLEET ENEMA 7-19 GM/118ML RE ENEM: 1.0000 | ENEMA | Freq: Once | RECTAL | Status: DC | PRN

## 2022-09-18 MED ORDER — ZIPRASIDONE MESYLATE 20 MG IM SOLR
20.0000 mg | Freq: Once | INTRAMUSCULAR | Status: AC
  Administered 2022-09-18: 20 mg via INTRAMUSCULAR
  Filled 2022-09-18: qty 20

## 2022-09-18 MED ORDER — METHOCARBAMOL 500 MG PO TABS
500.0000 mg | ORAL_TABLET | Freq: Four times a day (QID) | ORAL | Status: DC | PRN
  Administered 2022-09-21 – 2022-09-23 (×6): 500 mg via ORAL
  Filled 2022-09-18 (×6): qty 1

## 2022-09-18 MED ORDER — DEXAMETHASONE SODIUM PHOSPHATE 10 MG/ML IJ SOLN
INTRAMUSCULAR | Status: DC | PRN
  Administered 2022-09-18: 10 mg via INTRAVENOUS

## 2022-09-18 MED ORDER — POTASSIUM CHLORIDE CRYS ER 20 MEQ PO TBCR
40.0000 meq | EXTENDED_RELEASE_TABLET | Freq: Once | ORAL | Status: AC
  Administered 2022-09-18: 40 meq via ORAL
  Filled 2022-09-18: qty 2

## 2022-09-18 MED ORDER — ACETAMINOPHEN 10 MG/ML IV SOLN
1000.0000 mg | Freq: Four times a day (QID) | INTRAVENOUS | Status: DC
  Administered 2022-09-18: 1000 mg via INTRAVENOUS
  Filled 2022-09-18 (×4): qty 100

## 2022-09-18 MED ORDER — ORAL CARE MOUTH RINSE: 15.0000 mL | Freq: Once | OROMUCOSAL | Status: AC

## 2022-09-18 MED ORDER — FENTANYL CITRATE (PF) 250 MCG/5ML IJ SOLN
INTRAMUSCULAR | Status: AC
  Filled 2022-09-18: qty 5

## 2022-09-18 MED ORDER — ONDANSETRON HCL 4 MG/2ML IJ SOLN
INTRAMUSCULAR | Status: DC | PRN
  Administered 2022-09-18: 4 mg via INTRAVENOUS

## 2022-09-18 MED ORDER — HALOPERIDOL 1 MG PO TABS: 1.0000 mg | ORAL_TABLET | Freq: Four times a day (QID) | ORAL | Status: DC | PRN

## 2022-09-18 MED ORDER — LIDOCAINE 2% (20 MG/ML) 5 ML SYRINGE
INTRAMUSCULAR | Status: DC | PRN
  Administered 2022-09-18: 60 mg via INTRAVENOUS

## 2022-09-18 MED ORDER — DEXAMETHASONE SODIUM PHOSPHATE 10 MG/ML IJ SOLN
8.0000 mg | Freq: Once | INTRAMUSCULAR | Status: AC
  Administered 2022-09-18: 8 mg via INTRAVENOUS
  Filled 2022-09-18: qty 0.8

## 2022-09-18 MED ORDER — PHENYLEPHRINE HCL-NACL 20-0.9 MG/250ML-% IV SOLN
INTRAVENOUS | Status: DC | PRN
  Administered 2022-09-18: 40 ug/min via INTRAVENOUS

## 2022-09-18 MED ORDER — PROPOFOL 10 MG/ML IV BOLUS
INTRAVENOUS | Status: AC
  Filled 2022-09-18: qty 20

## 2022-09-18 MED ORDER — ENOXAPARIN SODIUM 30 MG/0.3ML IJ SOSY: 30.0000 mg | PREFILLED_SYRINGE | Freq: Two times a day (BID) | INTRAMUSCULAR | Status: DC

## 2022-09-18 MED ORDER — ACETAMINOPHEN 10 MG/ML IV SOLN: 1000.0000 mg | Freq: Once | INTRAVENOUS | Status: DC | PRN

## 2022-09-18 MED ORDER — MENTHOL 3 MG MT LOZG: 1.0000 | LOZENGE | OROMUCOSAL | Status: DC | PRN

## 2022-09-18 MED ORDER — TRANEXAMIC ACID-NACL 1000-0.7 MG/100ML-% IV SOLN
1000.0000 mg | INTRAVENOUS | Status: AC
  Administered 2022-09-18: 1000 mg via INTRAVENOUS
  Filled 2022-09-18 (×2): qty 100

## 2022-09-18 MED ORDER — ACETAMINOPHEN 160 MG/5ML PO SOLN: 325.0000 mg | Freq: Once | ORAL | Status: DC | PRN

## 2022-09-18 SURGICAL SUPPLY — 64 items
BAG COUNTER SPONGE SURGICOUNT (BAG) ×2 IMPLANT
BAG DECANTER FOR FLEXI CONT (MISCELLANEOUS) ×2 IMPLANT
BAG SPNG CNTER NS LX DISP (BAG) ×2
BANDAGE ESMARK 6X9 LF (GAUZE/BANDAGES/DRESSINGS) ×2 IMPLANT
BLADE CLIPPER SURG (BLADE) IMPLANT
BNDG CMPR 9X6 STRL LF SNTH (GAUZE/BANDAGES/DRESSINGS) ×2
BNDG ESMARK 6X9 LF (GAUZE/BANDAGES/DRESSINGS) ×2
BOWL SMART MIX CTS (DISPOSABLE) IMPLANT
COVER BACK TABLE 24X17X13 BIG (DRAPES) IMPLANT
COVER SURGICAL LIGHT HANDLE (MISCELLANEOUS) ×2 IMPLANT
CUFF TOURN SGL QUICK 34 (TOURNIQUET CUFF) ×2
CUFF TOURN SGL QUICK 42 (TOURNIQUET CUFF) IMPLANT
CUFF TRNQT CYL 34X4.125X (TOURNIQUET CUFF) IMPLANT
DRAPE HALF SHEET 40X57 (DRAPES) ×2 IMPLANT
DRAPE IMP U-DRAPE 54X76 (DRAPES) ×2 IMPLANT
DRAPE INCISE IOBAN 66X45 STRL (DRAPES) IMPLANT
DRILL BIT 6.4MMX7INL DISPOSE (BIT) IMPLANT
DRSG ADAPTIC 3X8 NADH LF (GAUZE/BANDAGES/DRESSINGS) ×2 IMPLANT
DRSG AQUACEL AG ADV 3.5X10 (GAUZE/BANDAGES/DRESSINGS) IMPLANT
DURAPREP 26ML APPLICATOR (WOUND CARE) ×4 IMPLANT
ELECT REM PT RETURN 9FT ADLT (ELECTROSURGICAL) ×2
ELECTRODE REM PT RTRN 9FT ADLT (ELECTROSURGICAL) ×2 IMPLANT
EVACUATOR 1/8 PVC DRAIN (DRAIN) IMPLANT
FACESHIELD WRAPAROUND (MASK) ×4 IMPLANT
FACESHIELD WRAPAROUND OR TEAM (MASK) ×4 IMPLANT
GAUZE PAD ABD 8X10 STRL (GAUZE/BANDAGES/DRESSINGS) ×2 IMPLANT
GAUZE SPONGE 4X4 12PLY STRL (GAUZE/BANDAGES/DRESSINGS) ×2 IMPLANT
GLOVE BIO SURGEON STRL SZ8 (GLOVE) ×2 IMPLANT
GLOVE BIOGEL M 8.0 STRL (GLOVE) ×2 IMPLANT
GLOVE BIOGEL PI IND STRL 8 (GLOVE) ×2 IMPLANT
GOWN STRL REUS W/ TWL LRG LVL3 (GOWN DISPOSABLE) ×4 IMPLANT
GOWN STRL REUS W/ TWL XL LVL3 (GOWN DISPOSABLE) ×4 IMPLANT
GOWN STRL REUS W/TWL LRG LVL3 (GOWN DISPOSABLE) ×4
GOWN STRL REUS W/TWL XL LVL3 (GOWN DISPOSABLE) ×4
HANDPIECE INTERPULSE COAX TIP (DISPOSABLE) ×2
KIT BASIN OR (CUSTOM PROCEDURE TRAY) ×2 IMPLANT
KIT TURNOVER KIT B (KITS) ×2 IMPLANT
MANIFOLD NEPTUNE II (INSTRUMENTS) ×2 IMPLANT
MARKER SPHERE PSV REFLC THRD 5 (MARKER) ×6 IMPLANT
NS IRRIG 1000ML POUR BTL (IV SOLUTION) ×2 IMPLANT
PACK TOTAL JOINT (CUSTOM PROCEDURE TRAY) ×2 IMPLANT
PACK UNIVERSAL I (CUSTOM PROCEDURE TRAY) ×2 IMPLANT
PAD ARMBOARD 7.5X6 YLW CONV (MISCELLANEOUS) ×4 IMPLANT
PAD CAST 4YDX4 CTTN HI CHSV (CAST SUPPLIES) ×2 IMPLANT
PADDING CAST COTTON 4X4 STRL (CAST SUPPLIES)
PIN SCHANZ 4MM 130MM (PIN) ×8 IMPLANT
PLATE ROT INSERT 12.5MM SIZE 4 (Plate) IMPLANT
SET HNDPC FAN SPRY TIP SCT (DISPOSABLE) IMPLANT
STAPLER VISISTAT 35W (STAPLE) ×2 IMPLANT
STRIP CLOSURE SKIN 1/2X4 (GAUZE/BANDAGES/DRESSINGS) ×4 IMPLANT
SUCTION FRAZIER HANDLE 10FR (MISCELLANEOUS) ×2
SUCTION TUBE FRAZIER 10FR DISP (MISCELLANEOUS) ×2 IMPLANT
SUT MNCRL AB 3-0 PS2 18 (SUTURE) ×2 IMPLANT
SUT PDS AB 0 CT 36 (SUTURE) ×6 IMPLANT
SUT STRATAFIX 1PDS 45CM VIOLET (SUTURE) IMPLANT
SUT VIC AB 0 CT1 27 (SUTURE) ×4
SUT VIC AB 0 CT1 27XBRD ANBCTR (SUTURE) ×4 IMPLANT
SUT VIC AB 2-0 CT1 27 (SUTURE) ×2
SUT VIC AB 2-0 CT1 TAPERPNT 27 (SUTURE) IMPLANT
SUT VIC AB 2-0 CTB1 (SUTURE) ×4 IMPLANT
TOWEL GREEN STERILE (TOWEL DISPOSABLE) ×2 IMPLANT
TOWEL GREEN STERILE FF (TOWEL DISPOSABLE) ×2 IMPLANT
TRAY FOLEY MTR SLVR 16FR STAT (SET/KITS/TRAYS/PACK) IMPLANT
WATER STERILE IRR 1000ML POUR (IV SOLUTION) ×6 IMPLANT

## 2022-09-18 NOTE — CV Procedure (Addendum)
   TEE performed to evaluate for endocarditis.  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 Mild AI.  Given clinical picture I have high suspicion for abscess formation. Recommend CT imaging for further evaluation. D/w ID team.   No other valvular vegetations noted.  See full report in [employer].   I called his son to discuss the results.  Arvilla Meres, MD  5:54 PM

## 2022-09-18 NOTE — Plan of Care (Signed)

## 2022-09-18 NOTE — Anesthesia Preprocedure Evaluation (Addendum)
Anesthesia Evaluation  Patient identified by MRN, date of birth, ID band Patient confused    Reviewed: Allergy & Precautions, NPO status , Patient's Chart, lab work & pertinent test results  History of Anesthesia Complications Negative for: history of anesthetic complications  Airway Mallampati: II  TM Distance: >3 FB Neck ROM: Full    Dental no notable dental hx. (+) Dental Advisory Given   Pulmonary sleep apnea , former smoker   Pulmonary exam normal        Cardiovascular hypertension, Pt. on medications + CAD, + CABG, + Peripheral Vascular Disease and +CHF  Normal cardiovascular exam  IMPRESSIONS      1. Left ventricular ejection fraction, by estimation, is 45 to 50%. The left ventricle has mildly decreased function. The left ventricle demonstrates global hypokinesis. Left ventricular diastolic parameters are consistent with Grade I diastolic dysfunction (impaired relaxation).  2. Right ventricular systolic function is normal. The right ventricular size is normal. There is normal pulmonary artery systolic pressure.  3. The mitral valve is normal in structure. No evidence of mitral valve regurgitation. No evidence of mitral stenosis. Moderate mitral annular calcification.  4. The aortic valve is normal in structure. There is severe calcifcation of the aortic valve. There is severe thickening of the aortic valve. Aortic valve regurgitation is not visualized. Mild aortic valve stenosis. Aortic valve mean gradient measures 13.0 mmHg. Aortic valve Vmax measures 2.39 m/s.  5. Aortic dilatation noted. There is mild dilatation of the ascending aorta, measuring 41 mm.  6. The inferior vena cava is normal in size with greater than 50% respiratory variability, suggesting right atrial pressure of 3 mmHg.   Comparison(s): No significant change from prior study. Prior images reviewed side by side.    Neuro/Psych negative neurological ROS      GI/Hepatic Neg liver ROS, hiatal hernia,,,  Endo/Other  negative endocrine ROS    Renal/GU Renal InsufficiencyRenal disease     Musculoskeletal negative musculoskeletal ROS (+)    Abdominal   Peds  Hematology  (+) Blood dyscrasia, anemia   Anesthesia Other Findings   Reproductive/Obstetrics                             Anesthesia Physical Anesthesia Plan  ASA: 4 and emergent  Anesthesia Plan: General   Post-op Pain Management: Ofirmev IV (intra-op)* and Toradol IV (intra-op)*   Induction:   PONV Risk Score and Plan: 2 and Ondansetron and Treatment may vary due to age or medical condition  Airway Management Planned: Oral ETT  Additional Equipment:   Intra-op Plan:   Post-operative Plan: Possible Post-op intubation/ventilation  Informed Consent: I have reviewed the patients History and Physical, chart, labs and discussed the procedure including the risks, benefits and alternatives for the proposed anesthesia with the patient or authorized representative who has indicated his/her understanding and acceptance.     Consent reviewed with POA and Dental advisory given  Plan Discussed with: Anesthesiologist and CRNA  Anesthesia Plan Comments:         Anesthesia Quick Evaluation

## 2022-09-18 NOTE — Progress Notes (Signed)
[facility]  Date of Admission:  09/12/2022           Reason for visit: Follow up on MSSA bacteremia  Current antibiotics: Cefazolin  ASSESSMENT:    77 y.o. male admitted with:  MSSA bacteremia: Patient presented 09/12/22.  Noted to have low-grade fever, leukocytosis, and new onset CHF.  Blood cultures checked 09/15/22 and found to have high-grade MSSA bacteremia.  Concerning for infective endocarditis given subacute symptomatology.  TTE negative for obvious vegetation.  TEE to be done today while in the OR for knee surgery.  Source suspected to be due to skin breakdown from scratching. Repeat blood cultures obtained 09/17/22. Right knee PJI: Patient also noted to have right knee pain with prior TKA.  Aspiration performed consistent with septic arthritis (96,500 WBC, GPC on Gram stain, Cultures with MSSA).  Seen by orthopedics and plan for DAIR 09/18/22. Encephalopathy: Likely multifactorial but, in the setting of MSSA bacteremia with concern for IE, raises concern for CNS emboli. CKD3a: Creatinine appears stable. CAD/PAD: Follows with Dr Allyson Sabal.  RECOMMENDATIONS:    Change from Cefazolin to Nafcillin for better CNS coverage pending TEE and MRI brain Plan for MRI brain after procedures today Plan for DAIR to deal with PJI today Hold off PICC placement until repeat blood cultures clear through the weekend Following.  Dr Daiva Eves here this weekend   Principal Problem:   Septic joint of right knee joint [facility]) Active Problems:   Coronary artery disease   Essential hypertension   Hyperlipidemia   Obstructive sleep apnea   Peripheral arterial disease (HCC)   Acute on chronic diastolic CHF (congestive heart failure) (HCC)   Class 1 obesity   CKD stage 3a, GFR 45-59 ml/min (HCC)   Malnutrition of moderate degree   MSSA bacteremia   Acute metabolic encephalopathy   LV dysfunction    MEDICATIONS:    Scheduled Meds:  aspirin EC  81 mg Oral Daily    dexamethasone (DECADRON) injection  8 mg Intravenous Once   diclofenac Sodium  4 g Topical QID   ezetimibe  10 mg Oral Daily   famotidine  40 mg Oral Daily   feeding supplement  237 mL Oral TID BM   insulin aspart  0-5 Units Subcutaneous QHS   insulin aspart  0-9 Units Subcutaneous TID WC   metoprolol tartrate  12.5 mg Oral BID   multivitamin with minerals  1 tablet Oral Daily   simvastatin  40 mg Oral Daily   Continuous Infusions:  sodium chloride 50 mL/hr at 09/18/22 1105   sodium chloride     acetaminophen     [START ON 09/19/2022]  ceFAZolin (ANCEF) IV     lactated ringers     nafcillin 12 g in sodium chloride 0.9 % 500 mL continuous infusion 12 g (09/18/22 1112)   thiamine (VITAMIN B1) injection 500 mg (09/17/22 1841)   tranexamic acid     PRN Meds:.acetaminophen, alum & mag hydroxide-simeth, diphenhydrAMINE, haloperidol **OR** haloperidol lactate, melatonin, morphine injection, polyethylene glycol, traMADol  SUBJECTIVE:   24 hour events:  Patient more delirious today Afebrile WBC unchanged Repeat blood cx obtained today Plan for ortho washout today of knee Family at the bedside   Review of Systems  Unable to perform ROS: Mental status change      OBJECTIVE:   Blood pressure 129/62, pulse 83, temperature 98 F (36.7 C), temperature source Axillary, resp. rate 16, height 5\' 7"  (1.702 m), weight 93.9 kg,  SpO2 94 %. Body mass index is 32.42 kg/m.  Physical Exam Constitutional:      Appearance: He is ill-appearing.  HENT:     Head: Normocephalic and atraumatic.  Eyes:     Extraocular Movements: Extraocular movements intact.     Conjunctiva/sclera: Conjunctivae normal.  Cardiovascular:     Pulses: Normal pulses.  Pulmonary:     Effort: Pulmonary effort is normal. No respiratory distress.  Abdominal:     General: There is no distension.     Palpations: Abdomen is soft.  Musculoskeletal:     Cervical back: Normal range of motion and neck supple.      Comments: Right knee is warm and swollen.   Skin:    General: Skin is warm and dry.  Neurological:     General: No focal deficit present.     Mental Status: He is alert. He is disoriented.      Lab Results: Lab Results  Component Value Date   WBC 18.6 (H) 09/18/2022   HGB 10.6 (L) 09/18/2022   HCT 32.8 (L) 09/18/2022   MCV 98.8 09/18/2022   PLT 471 (H) 09/18/2022    Lab Results  Component Value Date   NA 133 (L) 09/18/2022   K 3.6 09/18/2022   CO2 22 09/18/2022   GLUCOSE 152 (H) 09/18/2022   BUN 33 (H) 09/18/2022   CREATININE 1.43 (H) 09/18/2022   CALCIUM 9.7 09/18/2022   GFRNONAA 50 (L) 09/18/2022   GFRAA 54 (L) 06/04/2020    Lab Results  Component Value Date   ALT 25 09/12/2022   AST 31 09/12/2022   ALKPHOS 81 09/12/2022   BILITOT 0.9 09/12/2022       Component Value Date/Time   CRP <0.5 07/14/2022 1239       Component Value Date/Time   ESRSEDRATE 37 (H) 07/14/2022 1240     I have reviewed the micro and lab results in Epic.  Imaging: ECHOCARDIOGRAM COMPLETE  Result Date: 09/16/2022    ECHOCARDIOGRAM REPORT   Patient Name:   Johnny [name] Date of Exam: 09/16/2022 Medical Rec #:  [id number]       Height:       67.0 in Accession #:    [id number]      Weight:       195.6 lb Date of Birth:  June 23, 1945       BSA:          2.003 m Patient Age:    65 years        BP:           121/64 mmHg Patient Gender: M               HR:           73 bpm. Exam Location:  Inpatient Procedure: 2D Echo, Color Doppler and Cardiac Doppler Indications:    congestive heart failure  History:        Patient has prior history of Echocardiogram examinations, most                 recent 09/14/2022. CAD, PAD and chronic kidney disease,                 Signs/Symptoms:Bacteremia, Dyspnea and Edema; Risk Factors:Sleep                 Apnea, Hypertension and Dyslipidemia.  Sonographer:    Delcie Roch RDCS Referring Phys: [id number] MAURICIO DANIEL ARRIEN IMPRESSIONS  1. Left ventricular ejection  fraction,  by estimation, is 45 to 50%. The left ventricle has mildly decreased function. The left ventricle demonstrates global hypokinesis. Left ventricular diastolic parameters are consistent with Grade I diastolic dysfunction (impaired relaxation).  2. Right ventricular systolic function is normal. The right ventricular size is normal. There is normal pulmonary artery systolic pressure.  3. The mitral valve is normal in structure. No evidence of mitral valve regurgitation. No evidence of mitral stenosis. Moderate mitral annular calcification.  4. The aortic valve is normal in structure. There is severe calcifcation of the aortic valve. There is severe thickening of the aortic valve. Aortic valve regurgitation is not visualized. Mild aortic valve stenosis. Aortic valve mean gradient measures 13.0 mmHg. Aortic valve Vmax measures 2.39 m/s.  5. Aortic dilatation noted. There is mild dilatation of the ascending aorta, measuring 41 mm.  6. The inferior vena cava is normal in size with greater than 50% respiratory variability, suggesting right atrial pressure of 3 mmHg. Comparison(s): No significant change from prior study. Prior images reviewed side by side. FINDINGS  Left Ventricle: Left ventricular ejection fraction, by estimation, is 45 to 50%. The left ventricle has mildly decreased function. The left ventricle demonstrates global hypokinesis. The left ventricular internal cavity size was normal in size. There is  no left ventricular hypertrophy. Left ventricular diastolic parameters are consistent with Grade I diastolic dysfunction (impaired relaxation). Right Ventricle: The right ventricular size is normal. No increase in right ventricular wall thickness. Right ventricular systolic function is normal. There is normal pulmonary artery systolic pressure. The tricuspid regurgitant velocity is 1.89 m/s, and  with an assumed right atrial pressure of 3 mmHg, the estimated right ventricular systolic pressure is 17.3  mmHg. Left Atrium: Left atrial size was normal in size. Right Atrium: Right atrial size was normal in size. Pericardium: There is no evidence of pericardial effusion. Mitral Valve: The mitral valve is normal in structure. Moderate mitral annular calcification. No evidence of mitral valve regurgitation. No evidence of mitral valve stenosis. Tricuspid Valve: The tricuspid valve is normal in structure. Tricuspid valve regurgitation is not demonstrated. No evidence of tricuspid stenosis. Aortic Valve: The aortic valve is normal in structure. There is severe calcifcation of the aortic valve. There is severe thickening of the aortic valve. Aortic valve regurgitation is not visualized. Mild aortic stenosis is present. Aortic valve mean gradient measures 13.0 mmHg. Aortic valve peak gradient measures 22.8 mmHg. Aortic valve area, by VTI measures 1.53 cm. Pulmonic Valve: The pulmonic valve was normal in structure. Pulmonic valve regurgitation is mild. No evidence of pulmonic stenosis. Aorta: Aortic dilatation noted. There is mild dilatation of the ascending aorta, measuring 41 mm. Venous: The inferior vena cava is normal in size with greater than 50% respiratory variability, suggesting right atrial pressure of 3 mmHg. IAS/Shunts: No atrial level shunt detected by color flow Doppler.  LEFT VENTRICLE PLAX 2D LVIDd:         4.70 cm      Diastology LVIDs:         3.60 cm      LV e' medial:    7.30 cm/s LV PW:         1.20 cm      LV E/e' medial:  10.7 LV IVS:        1.20 cm      LV e' lateral:   8.86 cm/s LVOT diam:     2.10 cm      LV E/e' lateral: 8.8 LV SV:  69 LV SV Index:   34 LVOT Area:     3.46 cm  LV Volumes (MOD) LV vol d, MOD A2C: 120.0 ml LV vol s, MOD A2C: 61.7 ml LV SV MOD A2C:     58.3 ml RIGHT VENTRICLE             IVC RV Basal diam:  2.50 cm     IVC diam: 1.50 cm RV S prime:     12.30 cm/s TAPSE (M-mode): 1.0 cm LEFT ATRIUM             Index        RIGHT ATRIUM           Index LA diam:        3.60 cm  1.80 cm/m   RA Area:     13.60 cm LA Vol (A2C):   37.8 ml 18.87 ml/m  RA Volume:   28.60 ml  14.28 ml/m LA Vol (A4C):   36.1 ml 18.02 ml/m LA Biplane Vol: 38.3 ml 19.12 ml/m  AORTIC VALVE AV Area (Vmax):    1.52 cm AV Area (Vmean):   1.46 cm AV Area (VTI):     1.53 cm AV Vmax:           239.00 cm/s AV Vmean:          165.000 cm/s AV VTI:            0.451 m AV Peak Grad:      22.8 mmHg AV Mean Grad:      13.0 mmHg LVOT Vmax:         105.00 cm/s LVOT Vmean:        69.500 cm/s LVOT VTI:          0.200 m LVOT/AV VTI ratio: 0.44  AORTA Ao Root diam: 3.60 cm Ao Asc diam:  4.10 cm MITRAL VALVE               TRICUSPID VALVE MV Area (PHT): 3.53 cm    TR Peak grad:   14.3 mmHg MV Decel Time: 215 msec    TR Vmax:        189.00 cm/s MV E velocity: 77.80 cm/s MV A velocity: 90.00 cm/s  SHUNTS MV E/A ratio:  0.86        Systemic VTI:  0.20 m                            Systemic Diam: 2.10 cm Donato Schultz MD Electronically signed by Donato Schultz MD Signature Date/Time: 09/16/2022/3:42:34 PM    Final      Imaging independently reviewed in Epic.    Vedia Coffer for Infectious Disease [facility] [phone] pager 09/18/2022, 12:16 PM

## 2022-09-18 NOTE — Progress Notes (Signed)
Progress Note   Patient: Johnny [name] ZOX:[id number] DOB: 06-28-1945 DOA: 09/12/2022     5 DOS: the patient was seen and examined on 09/18/2022   Brief hospital course: Johnny [name] was admitted to the hospital with the working diagnosis of heart failure exacerbation.   77 yo male with the past medical history of coronary artery disease, sp CABG, abdominal aortic aneurysm, hypertension, obesity and dyslipidemia who presented with dyspnea. Reported progressive lower and upper extremity edema, along with dyspnea. On his initial physical examination his blood pressure was 130/69, HR 67m RR 16 and 02 saturation 97%, heart with S1 and S2 present and rhythmic, lungs with no wheezing or rales, abdomen with no distention, positive lower extremity edema.   Na 131, K 3,2 Cl 93, bicarbonate 24, glucose 143, bun 24, cr 1.29  Mg 1,7  High sensitive troponin 183 and 150  Wbc 13,4 hgb 10.0 plt 244  Sars covid 19 negative   Chest radiograph with cardiomegaly, bilateral hilar vascular congestion, bilateral atelectasis at bases with no effusions. Sternotomy wires in place.   EKG 75 bpm, normal axis, normal intervals, sinus rhythm with no significant ST segment, negative T wave lead V1 and V2. (Old changes).   05/ 20 patient has responded well to diuresis. Possible discharge tomorrow.  Check limited echocardiogram for elevated troponin.  05/21 echocardiogram with mild reduction in LV systolic function and positive new wall motion abnormalities.  05/22 positive blood culture for MSSA, ID and orthopedics have been formally consulted.  05/23 right knee arthrocentesis consistent with septic joint. Pending TEE.  05/24 patient has been confused last night, positive agitation and not able to sleep. Tele sitter at the bedside.  Plan for right knee I&D today.   Assessment and Plan: * Septic joint of right knee joint (HCC) Sepsis not present on admission, severe in intensity with AKI, end organ damage.   Positive  blood culture to MSSA.  Right knee synovial fluid with 96,500 wbc with 88 PMN. Fluid positive for staphylococcus aureus.   Wbc is 18.7,patient has bee afebrile.   Continue antibiotic therapy with IV cefazolin.  I&D right knee today per orthopedics.  Pending TEE to rule out endocarditis.  Source probably skin, severe pruritus and scratching.   Acute on chronic diastolic CHF (congestive heart failure) (HCC) Limited echocardiogram with LV systolic function mildly reduced to 40 to 45%, hypokinesis of the basal-mid antero septal wall, inferoseptal wall and anterior wall.   Volume status has improved.     Continue to hold antihypertensive medications and diuretics, due to worsening renal function.   Considering acute infection, no invasive testing recommended.  Possible sepsis related decreased LV systolic function.  Follow TEE to rule out endocarditis.   Essential hypertension Hold on antihypertensive medications.    Coronary artery disease Patient had decreased energy for 24 hrs prior to admission.  Old records personally reviewed, patient had CABG in 1992 and non ischemic Myoview in 2012.  He has not had frank angina.   Continue aspirin and increase dose of statin.   CKD stage 3a, GFR 45-59 ml/min (HCC) AKI, hyponatremia.   Renal function today with serum cr at 1,43 with K at 3,6 and serum bicarbonate at 22. Na 133   Add 40 kcl po today.  Patient with encephalopathy with poor oral intake, sepsis syndrome, will resume IV fluid with isotonic saline at 50 ml per hr.    Peripheral arterial disease (HCC) Continue with aspirin and statin therapy.   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   Hyperlipidemia Continue with ezetimibe and simvastatin (increase dose to 40 mg).   Obstructive sleep apnea C pap.   Class 1 obesity Calculated BMI is 31.6  Malnutrition of moderate degree Continue nutritional supplements.   Continue thiamine.   Acute metabolic encephalopathy Last night patient with agitation and confusion, positive delirium.  He received ziprasidone 20 mg IM, and 50 mg quetiapine last night.  This am continue confused and delirious.   Plan to continue neuro checks per unit protocol, aspiration precautions.  Needs source infection control, with right knee I&D.  Will add haloperidol as needed for agitation, continue with tele-sitter.         Subjective: Patient with positive agitation last night into this morning. He is confused and disorientated.   Physical Exam: Vitals:   09/17/22 1611 09/17/22 2006 09/18/22 0317 09/18/22 0318  BP: 128/62 (!) 140/71 129/62   Pulse:  83    Resp: (!) 23  16   Temp: 97.6 F (36.4 C) 97.7 F (36.5 C) 98 F (36.7 C)   TempSrc: Oral Oral Axillary   SpO2:   94%   Weight:    93.9 kg  Height:       Neurology , eyes closed, confused and disorientated. Positive lack of attention and disorganized thinking.  ENT dry mucous membranes, mild pallor Cardiovascular with S1 and S2 present and regular with no gallops or murmurs Respiratory with no rales or wheezing, no rhonchi  Abdomen with no distention  Data Reviewed:    Family Communication: no family at the bedside   Disposition: Status is: Inpatient Remains inpatient appropriate because: septic knee arthritis needs I&D  Planned Discharge Destination: Home     Author: Coralie Keens, MD 09/18/2022 8:50 AM  For on call review [email or web address].

## 2022-09-18 NOTE — Discharge Instructions (Signed)
 Johnny Aluisio, MD [facility] [address] [place], [place] [address] [phone]  POSTOPERATIVE DIRECTIONS   HOME CARE INSTRUCTIONS  Remove items at home which could result in a fall. This includes throw rugs or furniture in walking pathways.  ICE to the affected knee as much as tolerated. Icing helps control swelling. If the swelling is well controlled you will be more comfortable and rehab easier. Continue to use ice on the knee for pain and swelling from surgery. You may notice swelling that will progress down to the foot and ankle. This is normal after surgery. Elevate the leg when you are not up walking on it.    Continue to use the breathing machine which will help keep your temperature down. It is common for your temperature to cycle up and down following surgery, especially at night when you are not up moving around and exerting yourself. The breathing machine keeps your lungs expanded and your temperature down. Do not place pillow under the operative knee, focus on keeping the knee straight while resting  DIET You may resume your previous home diet once you are discharged from the hospital.  DRESSING / WOUND CARE / SHOWERING Keep your bulky bandage on for 2 days. On the third post-operative day you may remove the Ace bandage and gauze. There is a waterproof adhesive bandage on your skin which will stay in place until your first follow-up appointment. Once you remove this you will not need to place another bandage You may begin showering 3 days following surgery, but do not submerge the incision under water.  ACTIVITY For the first 5 days, the key is rest and control of pain and swelling Do your home exercises twice a day starting on post-operative day 3. On the days you go to physical therapy, just do the home exercises once that day. You should rest, ice and elevate the leg for 50 minutes out of every hour. Get up and  walk/stretch for 10 minutes per hour. After 5 days you can increase your activity slowly as tolerated. Walk with your walker as instructed. Use the walker until you are comfortable transitioning to a cane. Walk with the cane in the opposite hand of the operative leg. You may discontinue the cane once you are comfortable and walking steadily. Avoid periods of inactivity such as sitting longer than an hour when not asleep. This helps prevent blood clots.  You may discontinue the knee immobilizer once you are able to perform a straight leg raise while lying down. You may resume a sexual relationship in one month or when given the OK by your doctor.  You may return to work once you are cleared by your doctor.  Do not drive a car for 6 weeks or until released by your surgeon.  Do not drive while taking narcotics.  TED HOSE STOCKINGS Wear the elastic stockings on both legs for three weeks following surgery during the day. You may remove them at night for sleeping.  WEIGHT BEARING Weight bearing as tolerated with assist device (walker, cane, etc) as directed, use it as long as suggested by your surgeon or therapist, typically at least 4-6 weeks.  POSTOPERATIVE CONSTIPATION PROTOCOL Constipation - defined medically as fewer than three stools per week and severe constipation as less than one stool per week.  One of the most common issues patients have following surgery is constipation.  Even if you have a regular bowel pattern at home, your normal   regimen is likely to be disrupted due to multiple reasons following surgery.  Combination of anesthesia, postoperative narcotics, change in appetite and fluid intake all can affect your bowels.  In order to avoid complications following surgery, here are some recommendations in order to help you during your recovery period.  Colace (docusate) - Pick up an over-the-counter form of Colace or another stool softener and take twice a day as long as you are requiring  postoperative pain medications.  Take with a full glass of water daily.  If you experience loose stools or diarrhea, hold the colace until you stool forms back up. If your symptoms do not get better within 1 week or if they get worse, check with your doctor. Dulcolax (bisacodyl) - Pick up over-the-counter and take as directed by the product packaging as needed to assist with the movement of your bowels.  Take with a full glass of water.  Use this product as needed if not relieved by Colace only.  MiraLax (polyethylene glycol) - Pick up over-the-counter to have on hand. MiraLax is a solution that will increase the amount of water in your bowels to assist with bowel movements.  Take as directed and can mix with a glass of water, juice, soda, coffee, or tea. Take if you go more than two days without a movement. Do not use MiraLax more than once per day. Call your doctor if you are still constipated or irregular after using this medication for 7 days in a row.  If you continue to have problems with postoperative constipation, please contact the office for further assistance and recommendations.  If you experience "the worst abdominal pain ever" or develop nausea or vomiting, please contact the office immediatly for further recommendations for treatment.  ITCHING If you experience itching with your medications, try taking only a single pain pill, or even half a pain pill at a time.  You can also use Benadryl over the counter for itching or also to help with sleep.   MEDICATIONS See your medication summary on the "After Visit Summary" that the nursing staff will review with you prior to discharge.  You may have some home medications which will be placed on hold until you complete the course of blood thinner medication.  It is important for you to complete the blood thinner medication as prescribed by your surgeon.  Continue your approved medications as instructed at time of discharge.  PRECAUTIONS If you  experience chest pain or shortness of breath - call 911 immediately for transfer to the hospital emergency department.  If you develop a fever greater that 101 F, purulent drainage from wound, increased redness or drainage from wound, foul odor from the wound/dressing, or calf pain - CONTACT YOUR SURGEON.                                                   FOLLOW-UP APPOINTMENTS Make sure you keep all of your appointments after your operation with your surgeon and caregivers. You should call the office at the above phone number and make an appointment for approximately two weeks after the date of your surgery or on the date instructed by your surgeon outlined in the "After Visit Summary".  RANGE OF MOTION AND STRENGTHENING EXERCISES  Rehabilitation of the knee is important following a knee injury or an operation. After just a   few days of immobilization, the muscles of the thigh which control the knee become weakened and shrink (atrophy). Knee exercises are designed to build up the tone and strength of the thigh muscles and to improve knee motion. Often times heat used for twenty to thirty minutes before working out will loosen up your tissues and help with improving the range of motion but do not use heat for the first two weeks following surgery. These exercises can be done on a training (exercise) mat, on the floor, on a table or on a bed. Use what ever works the best and is most comfortable for you Knee exercises include:  Leg Lifts - While your knee is still immobilized in a splint or cast, you can do straight leg raises. Lift the leg to 60 degrees, hold for 3 sec, and slowly lower the leg. Repeat 10-20 times 2-3 times daily. Perform this exercise against resistance later as your knee gets better.  Quad and Hamstring Sets - Tighten up the muscle on the front of the thigh (Quad) and hold for 5-10 sec. Repeat this 10-20 times hourly. Hamstring sets are done by pushing the foot backward against an object and  holding for 5-10 sec. Repeat as with quad sets.  Leg Slides: Lying on your back, slowly slide your foot toward your buttocks, bending your knee up off the floor (only go as far as is comfortable). Then slowly slide your foot back down until your leg is flat on the floor again. Angel Wings: Lying on your back spread your legs to the side as far apart as you can without causing discomfort.  A rehabilitation program following serious knee injuries can speed recovery and prevent re-injury in the future due to weakened muscles. Contact your doctor or a physical therapist for more information on knee rehabilitation.   POST-OPERATIVE OPIOID TAPER INSTRUCTIONS: It is important to wean off of your opioid medication as soon as possible. If you do not need pain medication after your surgery it is ok to stop day one. Opioids include: Codeine, Hydrocodone(Norco, Vicodin), Oxycodone(Percocet, oxycontin) and hydromorphone amongst others.  Long term and even short term use of opiods can cause: Increased pain response Dependence Constipation Depression Respiratory depression And more.  Withdrawal symptoms can include Flu like symptoms Nausea, vomiting And more Techniques to manage these symptoms Hydrate well Eat regular healthy meals Stay active Use relaxation techniques(deep breathing, meditating, yoga) Do Not substitute Alcohol to help with tapering If you have been on opioids for less than two weeks and do not have pain than it is ok to stop all together.  Plan to wean off of opioids This plan should start within one week post op of your joint replacement. Maintain the same interval or time between taking each dose and first decrease the dose.  Cut the total daily intake of opioids by one tablet each day Next start to increase the time between doses. The last dose that should be eliminated is the evening dose.   IF YOU ARE TRANSFERRED TO A SKILLED REHAB FACILITY If the patient is transferred to  a skilled rehab facility following release from the hospital, a list of the current medications will be sent to the facility for the patient to continue.  When discharged from the skilled rehab facility, please have the facility set up the patient's Home Health Physical Therapy prior to being released. Also, the skilled facility will be responsible for providing the patient with their medications at time of release from the   facility to include their pain medication, the muscle relaxants, and their blood thinner medication. If the patient is still at the rehab facility at time of the two week follow up appointment, the skilled rehab facility will also need to assist the patient in arranging follow up appointment in our office and any transportation needs.  MAKE SURE YOU:  Understand these instructions.  Get help right away if you are not doing well or get worse.    Pick up stool softner and laxative for home use following surgery while on pain medications. Do not submerge incision under water. Please use good hand washing techniques while changing dressing each day. May shower starting three days after surgery. Please use a clean towel to pat the incision dry following showers. Continue to use ice for pain and swelling after surgery. Do not use any lotions or creams on the incision until instructed by your surgeon. 

## 2022-09-18 NOTE — Progress Notes (Signed)
Rounding Note    Patient Name: Johnny [name] Date of Encounter: 09/18/2022  [facility] Cardiologist: Nanetta Batty, MD   Subjective   Seems frustrated and confused this morning, depressed about the need for knee surgery. Remains weak.   Inpatient Medications    Scheduled Meds:  aspirin EC  81 mg Oral Daily   diclofenac Sodium  4 g Topical QID   ezetimibe  10 mg Oral Daily   famotidine  40 mg Oral Daily   feeding supplement  237 mL Oral TID BM   insulin aspart  0-5 Units Subcutaneous QHS   insulin aspart  0-9 Units Subcutaneous TID WC   metoprolol tartrate  12.5 mg Oral BID   multivitamin with minerals  1 tablet Oral Daily   potassium chloride  40 mEq Oral Once   simvastatin  40 mg Oral Daily   Continuous Infusions:  sodium chloride      ceFAZolin (ANCEF) IV 2 g (09/18/22 0835)   thiamine (VITAMIN B1) injection 500 mg (09/17/22 1841)   PRN Meds: acetaminophen, alum & mag hydroxide-simeth, diphenhydrAMINE, haloperidol **OR** haloperidol lactate, melatonin, morphine injection, polyethylene glycol, traMADol   Vital Signs    Vitals:   09/17/22 1611 09/17/22 2006 09/18/22 0317 09/18/22 0318  BP: 128/62 (!) 140/71 129/62   Pulse:  83    Resp: (!) 23  16   Temp: 97.6 F (36.4 C) 97.7 F (36.5 C) 98 F (36.7 C)   TempSrc: Oral Oral Axillary   SpO2:   94%   Weight:    93.9 kg  Height:        Intake/Output Summary (Last 24 hours) at 09/18/2022 0854 Last data filed at 09/18/2022 0415 Gross per 24 hour  Intake 120 ml  Output 1000 ml  Net -880 ml      09/18/2022    3:18 AM 09/17/2022    6:43 AM 09/16/2022    4:02 AM  Last 3 Weights  Weight (lbs) 207 lb 0.2 oz 200 lb 195 lb 9.6 oz  Weight (kg) 93.9 kg 90.719 kg 88.724 kg      Telemetry    Sinus rhythm, sinus tach - Personally Reviewed  ECG    No new tracing   Physical Exam   GEN: No acute distress.   Neck: No JVD Cardiac: RRR, no murmurs, rubs, or gallops.  Respiratory: Clear to  auscultation bilaterally. GI: Soft, nontender, non-distended  MS: No edema; Right knee swelling Neuro:  Nonfocal  Psych: Normal affect   Labs    High Sensitivity Troponin:   Recent Labs  Lab 09/12/22 1311 09/12/22 1700  TROPONINIHS 183* 150*     Chemistry Recent Labs  Lab 09/12/22 1311 09/13/22 0256 09/14/22 0353 09/15/22 0228 09/16/22 0204 09/17/22 0312 09/18/22 0645  NA 131* 131* 131*   < > 133* 134* 133*  K 3.2* 3.6 3.6   < > 3.7 3.2* 3.6  CL 93* 94* 93*   < > 94* 97* 96*  CO2 24 26 25    < > 27 22 22   GLUCOSE 143* 153* 118*   < > 142* 150* 152*  BUN 24* 19 21   < > 44* 40* 33*  CREATININE 1.29* 1.28* 1.52*   < > 1.90* 1.65* 1.43*  CALCIUM 8.2* 8.1* 8.4*   < > 8.7* 9.2 9.7  MG 1.7 1.6* 1.8  --   --   --   --   PROT 7.1  --   --   --   --   --   --  ALBUMIN 2.9*  --   --   --   --   --   --   AST 31  --   --   --   --   --   --   ALT 25  --   --   --   --   --   --   ALKPHOS 81  --   --   --   --   --   --   BILITOT 0.9  --   --   --   --   --   --   GFRNONAA 57* 57* 47*   < > 36* 42* 50*  ANIONGAP 14 11 13    < > 12 15 15    < > = values in this interval not displayed.    Lipids  Recent Labs  Lab 09/16/22 0646  CHOL 51  TRIG 78  HDL 14*  LDLCALC 21  CHOLHDL 3.6    Hematology Recent Labs  Lab 09/16/22 0204 09/17/22 0312 09/18/22 0645  WBC 18.7* 17.8* 18.6*  RBC 3.04* 3.05* 3.32*  HGB 10.0* 10.3* 10.6*  HCT 30.3* 29.8* 32.8*  MCV 99.7 97.7 98.8  MCH 32.9 33.8 31.9  MCHC 33.0 34.6 32.3  RDW 13.3 13.4 13.4  PLT 364 420* 471*   Thyroid No results for input(s): "TSH", "FREET4" in the last 168 hours.  BNP Recent Labs  Lab 09/12/22 1311  BNP 131.6*    DDimer No results for input(s): "DDIMER" in the last 168 hours.   Radiology    ECHOCARDIOGRAM COMPLETE  Result Date: 09/16/2022    ECHOCARDIOGRAM REPORT   Patient Name:   Johnny [name] Date of Exam: 09/16/2022 Medical Rec #:  [id number]       Height:       67.0 in Accession #:    [id number]       Weight:       195.6 lb Date of Birth:  [birth year]       BSA:          2.003 m Patient Age:    22 years        BP:           121/64 mmHg Patient Gender: M               HR:           73 bpm. Exam Location:  Inpatient Procedure: 2D Echo, Color Doppler and Cardiac Doppler Indications:    congestive heart failure  History:        Patient has prior history of Echocardiogram examinations, most                 recent 09/14/2022. CAD, PAD and chronic kidney disease,                 Signs/Symptoms:Bacteremia, Dyspnea and Edema; Risk Factors:Sleep                 Apnea, Hypertension and Dyslipidemia.  Sonographer:    Delcie Roch RDCS Referring Phys: [id number] MAURICIO DANIEL ARRIEN IMPRESSIONS  1. Left ventricular ejection fraction, by estimation, is 45 to 50%. The left ventricle has mildly decreased function. The left ventricle demonstrates global hypokinesis. Left ventricular diastolic parameters are consistent with Grade I diastolic dysfunction (impaired relaxation).  2. Right ventricular systolic function is normal. The right ventricular size is normal. There is normal pulmonary artery systolic pressure.  3. The mitral valve is normal in structure. No  evidence of mitral valve regurgitation. No evidence of mitral stenosis. Moderate mitral annular calcification.  4. The aortic valve is normal in structure. There is severe calcifcation of the aortic valve. There is severe thickening of the aortic valve. Aortic valve regurgitation is not visualized. Mild aortic valve stenosis. Aortic valve mean gradient measures 13.0 mmHg. Aortic valve Vmax measures 2.39 m/s.  5. Aortic dilatation noted. There is mild dilatation of the ascending aorta, measuring 41 mm.  6. The inferior vena cava is normal in size with greater than 50% respiratory variability, suggesting right atrial pressure of 3 mmHg. Comparison(s): No significant change from prior study. Prior images reviewed side by side. FINDINGS  Left Ventricle: Left ventricular  ejection fraction, by estimation, is 45 to 50%. The left ventricle has mildly decreased function. The left ventricle demonstrates global hypokinesis. The left ventricular internal cavity size was normal in size. There is  no left ventricular hypertrophy. Left ventricular diastolic parameters are consistent with Grade I diastolic dysfunction (impaired relaxation). Right Ventricle: The right ventricular size is normal. No increase in right ventricular wall thickness. Right ventricular systolic function is normal. There is normal pulmonary artery systolic pressure. The tricuspid regurgitant velocity is 1.89 m/s, and  with an assumed right atrial pressure of 3 mmHg, the estimated right ventricular systolic pressure is 17.3 mmHg. Left Atrium: Left atrial size was normal in size. Right Atrium: Right atrial size was normal in size. Pericardium: There is no evidence of pericardial effusion. Mitral Valve: The mitral valve is normal in structure. Moderate mitral annular calcification. No evidence of mitral valve regurgitation. No evidence of mitral valve stenosis. Tricuspid Valve: The tricuspid valve is normal in structure. Tricuspid valve regurgitation is not demonstrated. No evidence of tricuspid stenosis. Aortic Valve: The aortic valve is normal in structure. There is severe calcifcation of the aortic valve. There is severe thickening of the aortic valve. Aortic valve regurgitation is not visualized. Mild aortic stenosis is present. Aortic valve mean gradient measures 13.0 mmHg. Aortic valve peak gradient measures 22.8 mmHg. Aortic valve area, by VTI measures 1.53 cm. Pulmonic Valve: The pulmonic valve was normal in structure. Pulmonic valve regurgitation is mild. No evidence of pulmonic stenosis. Aorta: Aortic dilatation noted. There is mild dilatation of the ascending aorta, measuring 41 mm. Venous: The inferior vena cava is normal in size with greater than 50% respiratory variability, suggesting right atrial pressure  of 3 mmHg. IAS/Shunts: No atrial level shunt detected by color flow Doppler.  LEFT VENTRICLE PLAX 2D LVIDd:         4.70 cm      Diastology LVIDs:         3.60 cm      LV e' medial:    7.30 cm/s LV PW:         1.20 cm      LV E/e' medial:  10.7 LV IVS:        1.20 cm      LV e' lateral:   8.86 cm/s LVOT diam:     2.10 cm      LV E/e' lateral: 8.8 LV SV:         69 LV SV Index:   34 LVOT Area:     3.46 cm  LV Volumes (MOD) LV vol d, MOD A2C: 120.0 ml LV vol s, MOD A2C: 61.7 ml LV SV MOD A2C:     58.3 ml RIGHT VENTRICLE             IVC RV  Basal diam:  2.50 cm     IVC diam: 1.50 cm RV S prime:     12.30 cm/s TAPSE (M-mode): 1.0 cm LEFT ATRIUM             Index        RIGHT ATRIUM           Index LA diam:        3.60 cm 1.80 cm/m   RA Area:     13.60 cm LA Vol (A2C):   37.8 ml 18.87 ml/m  RA Volume:   28.60 ml  14.28 ml/m LA Vol (A4C):   36.1 ml 18.02 ml/m LA Biplane Vol: 38.3 ml 19.12 ml/m  AORTIC VALVE AV Area (Vmax):    1.52 cm AV Area (Vmean):   1.46 cm AV Area (VTI):     1.53 cm AV Vmax:           239.00 cm/s AV Vmean:          165.000 cm/s AV VTI:            0.451 m AV Peak Grad:      22.8 mmHg AV Mean Grad:      13.0 mmHg LVOT Vmax:         105.00 cm/s LVOT Vmean:        69.500 cm/s LVOT VTI:          0.200 m LVOT/AV VTI ratio: 0.44  AORTA Ao Root diam: 3.60 cm Ao Asc diam:  4.10 cm MITRAL VALVE               TRICUSPID VALVE MV Area (PHT): 3.53 cm    TR Peak grad:   14.3 mmHg MV Decel Time: 215 msec    TR Vmax:        189.00 cm/s MV E velocity: 77.80 cm/s MV A velocity: 90.00 cm/s  SHUNTS MV E/A ratio:  0.86        Systemic VTI:  0.20 m                            Systemic Diam: 2.10 cm Donato Schultz MD Electronically signed by Donato Schultz MD Signature Date/Time: 09/16/2022/3:42:34 PM    Final     Cardiac Studies   Echo: 09/16/2022  IMPRESSIONS     1. Left ventricular ejection fraction, by estimation, is 45 to 50%. The  left ventricle has mildly decreased function. The left ventricle   demonstrates global hypokinesis. Left ventricular diastolic parameters are  consistent with Grade I diastolic  dysfunction (impaired relaxation).   2. Right ventricular systolic function is normal. The right ventricular  size is normal. There is normal pulmonary artery systolic pressure.   3. The mitral valve is normal in structure. No evidence of mitral valve  regurgitation. No evidence of mitral stenosis. Moderate mitral annular  calcification.   4. The aortic valve is normal in structure. There is severe calcifcation  of the aortic valve. There is severe thickening of the aortic valve.  Aortic valve regurgitation is not visualized. Mild aortic valve stenosis.  Aortic valve mean gradient measures  13.0 mmHg. Aortic valve Vmax measures 2.39 m/s.   5. Aortic dilatation noted. There is mild dilatation of the ascending  aorta, measuring 41 mm.   6. The inferior vena cava is normal in size with greater than 50%  respiratory variability, suggesting right atrial pressure of 3 mmHg.   Comparison(s): No significant change  from prior study. Prior images  reviewed side by side.   FINDINGS   Left Ventricle: Left ventricular ejection fraction, by estimation, is 45  to 50%. The left ventricle has mildly decreased function. The left  ventricle demonstrates global hypokinesis. The left ventricular internal  cavity size was normal in size. There is   no left ventricular hypertrophy. Left ventricular diastolic parameters  are consistent with Grade I diastolic dysfunction (impaired relaxation).   Right Ventricle: The right ventricular size is normal. No increase in  right ventricular wall thickness. Right ventricular systolic function is  normal. There is normal pulmonary artery systolic pressure. The tricuspid  regurgitant velocity is 1.89 m/s, and   with an assumed right atrial pressure of 3 mmHg, the estimated right  ventricular systolic pressure is 17.3 mmHg.   Left Atrium: Left atrial size  was normal in size.   Right Atrium: Right atrial size was normal in size.   Pericardium: There is no evidence of pericardial effusion.   Mitral Valve: The mitral valve is normal in structure. Moderate mitral  annular calcification. No evidence of mitral valve regurgitation. No  evidence of mitral valve stenosis.   Tricuspid Valve: The tricuspid valve is normal in structure. Tricuspid  valve regurgitation is not demonstrated. No evidence of tricuspid  stenosis.   Aortic Valve: The aortic valve is normal in structure. There is severe  calcifcation of the aortic valve. There is severe thickening of the aortic  valve. Aortic valve regurgitation is not visualized. Mild aortic stenosis  is present. Aortic valve mean  gradient measures 13.0 mmHg. Aortic valve peak gradient measures 22.8  mmHg. Aortic valve area, by VTI measures 1.53 cm.   Pulmonic Valve: The pulmonic valve was normal in structure. Pulmonic valve  regurgitation is mild. No evidence of pulmonic stenosis.   Aorta: Aortic dilatation noted. There is mild dilatation of the ascending  aorta, measuring 41 mm.   Venous: The inferior vena cava is normal in size with greater than 50%  respiratory variability, suggesting right atrial pressure of 3 mmHg.   IAS/Shunts: No atrial level shunt detected by color flow Doppler.    Patient Profile     77 y.o. 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 Ella Jubilee.   Assessment & Plan    HFrEF -- presented with signs of hypervolemia on admission with complaints of DOE and has been diuresed.  Subsequent echocardiogram on 09/14/2022 showed decline LVEF of 40 to 45% with  new regional wall motion abnormalities in the left ventricular, basal-mid anteroseptal wall, inferoseptal wall and anterior wall. -- Patient has also stated a significant and sudden decline in functional capacity as well worsening symptoms of shortness of breath, orthopnea, peripheral edema.  -- unclear etiology for decline in LVEF at this time, but he is not a candidate for invasive work presently --GDMT: has been limited in the setting of hypotension and elevated CR. Continue to hold jardiance, losartan, and spiro with elevated Cr.  Low-dose beta-blocker was added.  Because of sinus tachycardia will uptitrate.   CAD s/p CABG 1992 PAD s/p AO-bifem 2005 -- echocardiogram on 09/14/2022 indicating a reduction in LVEF of 40 to 45% with new regional wall motion abnormalities  in the left ventricular, basal-mid anteroseptal wall, inferoseptal wall and anterior wall.  -- currently not appropriate candidate for invasive work up -- continue asprin 81mg  daily, zetia 10mg  daily, and zocor 40mg  daily   AKI on CKD stage IIIa -- creatinine 1.79>>1.9>>1.65>>1.43 -- continue to hold diuresis -- will stop IVFs this morning given improvement in renal function   HLD --continue zertia and zocor as above   MSSA bacteremia Right knee infection -- ID following as well as ortho -- on cefazolin -- TTE without acute findings on valvular structures, will need TEE but schedule is full until 5/28 -- ortho with recs for surgery/wash out   Hypokalemia -- K+ 3.2, supplement--->3.6  For questions or updates, please contact [facility] Please consult [email or web address] for contact info under        Signed, Nanetta Batty, MD  09/18/2022, 8:54 AM

## 2022-09-18 NOTE — Transfer of Care (Signed)
Immediate Anesthesia Transfer of Care Note  Patient: Johnny [name]  Procedure(s) Performed: IRRIGATION AND DEBRIDEMENT KNEE WITH POLY EXCHANGE (Right: Knee) TRANSESOPHAGEAL ECHOCARDIOGRAM (Esophagus)  Patient Location: PACU  Anesthesia Type:General  Level of Consciousness: awake, drowsy, and confused at preop baseline  Airway & Oxygen Therapy: Patient connected to nasal cannula oxygen  Post-op Assessment: Report given to RN and Post -op Vital signs reviewed and stable  Post vital signs: Reviewed and stable  Last Vitals:  Vitals Value Taken Time  BP 119/77 09/18/22 1805  Temp    Pulse 78 09/18/22 1808  Resp 28 09/18/22 1808  SpO2 93 % 09/18/22 1808  Vitals shown include unvalidated device data.  Last Pain:  Vitals:   09/18/22 1534  TempSrc: Axillary  PainSc:       Patients Stated Pain Goal: 0 (09/15/22 2005)  Complications: No notable events documented.

## 2022-09-18 NOTE — Op Note (Unsigned)
Johnny [name], THRAILKILL MEDICAL RECORD NO: [id number] ACCOUNT NO: [id number] DATE OF BIRTH: [birth year] FACILITY: [facility] LOCATION: [id number] PHYSICIAN: Gus Rankin. Sakai Wolford, MD  Operative Report   DATE OF PROCEDURE: 09/18/2022  PREOPERATIVE DIAGNOSIS:  Septic arthritis, right knee.  POSTOPERATIVE DIAGNOSIS:  Septic arthritis, right knee.  PROCEDURE:  Irrigation and debridement, right knee with polyethylene exchange.  SURGEON:  Gus Rankin. Sanaz Scarlett, MD  ASSISTANT:  Arther Abbott, PA-C.  ANESTHESIA:  General.  ESTIMATED BLOOD LOSS:  25 mL.  DRAINS:  None.  TOURNIQUET TIME:  24 minutes at 300 mmHg.  COMPLICATIONS:  None.  CONDITION:  Stable to recovery.  BRIEF CLINICAL NOTE:  The patient is a 77 year old male who had a right total knee arthroplasty approximately 12 years ago.  He had no problems at all with the knee until earlier this week when he became bacteremic and septic *** right knee.  He  presented with a grossly swollen, painful knee.  Aspiration showed a staphylococcal species, which was consistent with his positive blood cultures.  He presents now for irrigation, debridement and polyethylene exchange.  DESCRIPTION OF PROCEDURE:  After successful administration of general anesthetic, a tourniquet was placed high on his right thigh and his right lower extremity was prepped and draped in the usual sterile fashion.  Cardiology did the transesophageal echo  prior to this.  After prepping and draping the extremity was wrapped in Esmarch and tourniquet inflated to 300 mmHg.  Midline incision is made with a 10 blade through subcutaneous tissue to the level of the extensor mechanism.  Fresh blade was used to  make a medial parapatellar arthrotomy.  There was gross purulent fluid present in the joint.  We thoroughly irrigated with 2 liters of saline with pulsatile lavage and then flexed the knee and I did a thorough synovectomy.  The tibial polyethylene was  removed, which was a 12.5 mm  polyethylene for a size 4 Sigma femur.  The wound was further irrigated with another liter of saline.  Then, a liter of Prontosan.  I then irrigated with another 2 liters of saline and then placed a new 12.5 mm thick  polyethylene.  We irrigated further with another 2 liters of saline with pulsatile lavage.  We then closed the arthrotomy with a running #1 Stratafix suture.  The tourniquet was then released, total time of 24 minutes.  Subcutaneous was closed with  interrupted 2-0 Vicryl and skin closed with staples.  Incision was cleaned and dried and Aquacel dressing and bulky sterile dressing applied.  He was then awakened and transported to recovery in stable condition.  Note that a surgical assistant was of medical necessity for this procedure to do it in a safe and expeditious manner.  Surgical assistant was necessary for retraction of vital structures and for proper positioning of the limb for safe removal of the old  implant and safe and accurate placement of the new implant.   PUS D: 09/18/2022 6:00:11 pm T: 09/18/2022 6:49:00 pm  JOB: [id number]/ [id number]

## 2022-09-18 NOTE — Care Management Important Message (Signed)
Important Message  Patient Details  Name: Johnny [name] MRN: [id number] Date of Birth: [birth year]   Medicare Important Message Given:  Other (see comment)     Sherilyn Banker 09/18/2022, 3:14 PM

## 2022-09-18 NOTE — Progress Notes (Signed)
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   Plan to order a contrast aortic root chest CT.  Patient has a mild elevation in serum cr but it is improving, risk vs benefit it is best to perform diagnostic imaging,  (potential life threatening diagnosis).

## 2022-09-18 NOTE — Interval H&P Note (Signed)
History and Physical Interval Note:  09/18/2022 3:12 PM  Johnny [name]  has presented today for surgery, with the diagnosis of Rigjt Knee infection.  The various methods of treatment have been discussed with the patient and family. After consideration of risks, benefits and other options for treatment, the patient has consented to  Procedure(s): IRRIGATION AND DEBRIDEMENT KNEE WITH POLY EXCHANGE (Right) as a surgical intervention.  The patient's history has been reviewed, patient examined, no change in status, stable for surgery.  I have reviewed the patient's chart and labs.  Questions were answered to the patient's satisfaction.     [name]

## 2022-09-18 NOTE — Progress Notes (Signed)
   Heart Failure Stewardship Pharmacist Progress Note   PCP: Daisy Floro, MD PCP-Cardiologist: Nanetta Batty, MD    HPI:  77 yo M with PMH of CAD s/p CABG, PAD, aortic stenosis, HTN, AAA, and HLD.   Presented to the ED on 5/18 with swelling to hands and lower extremities, generalized weakness, shortness of breath, and ongoing pruritus. BNP 131 and troponin peak 183. CXR without pleural effusion or edema. ECHO 5/20 showed LVEF reduced to 40-45%, regional wall motion abnormalities, and hypokinesis of left ventricular, basal-mid anteroseptal wall, inferoseptal wall and anterior wall.  Patient also reported R knee pain and appeared more confused. Found to have high grade MSSA bacteremia. ID consulted. Ortho recommending knee surgery. Plan TEE today to rule out endocarditis.  Current HF Medications: Beta Blocker: metoprolol tartrate 12.5 mg BID  Prior to admission HF Medications: Diuretic: furosemide 20 mg daily ACE/ARB/ARNI: olmesartan 40 mg daily *also takes HCTZ 25 mg daily  Pertinent Lab Values: Serum creatinine 1.43, BUN 33, Potassium 3.6, Sodium 133, BNP 131.6, Magnesium 1.8, A1c 5.8   Vital Signs: Weight: 207 lbs (admission weight: 202 lbs) Blood pressure: 120/60s Heart rate: 80-100s  I/O: -64 ml yesterday; net -1.5 L  Medication Assistance / Insurance Benefits Check: Does the patient have prescription insurance?  Yes Type of insurance plan: UHC Medicare  Does the patient qualify for medication assistance through manufacturers or grants?   Pending Eligible grants and/or patient assistance programs: pending Medication assistance applications in progress: none  Medication assistance applications approved: none Approved medication assistance renewals will be completed by: pending  Outpatient Pharmacy:  Prior to admission outpatient [facility]: [facility] Is the patient willing to use [facility] TOC pharmacy at discharge? Yes Is the patient willing to transition their outpatient  pharmacy to utilize a [facility] outpatient pharmacy?   Pending    Assessment: 1. Acute systolic CHF (LVEF 40-45%), not currently a candidate for ischemic evaluation. NYHA class II symptoms. - SCr 1.65>1.43 today (BL ~1.3). Strict I/Os and daily weights. Keep K>4 and Mg>2. KCl 40 mEq x 1 given for replacement.  - Does not appear volume overloaded on exam, no indication for diuretics - Continue metoprolol tartrate 25 mg BID, may need to increase with sinus tach this AM - No ARB/ARNI, MRA, or SGLT2i with AKI, renal function improving   Plan: 1) Medication changes recommended at this time: - Continue current regimen, monitor for continued renal improvement - May need to increase metoprolol  2) Patient assistance: - Jardiance/Farxiga copay $47 - Entresot copay $47  3)  Education  - To be completed prior to discharge - Patient agitated and confused today, unable to educate  Sharen Hones, PharmD, BCPS Heart Failure Engineer, building services Phone [phone]

## 2022-09-18 NOTE — Progress Notes (Signed)
IVT consult for PIV access due to loss of last access.  This RN to bedside- family at bedside, states he has been combative since this morning.  PIV started with assistance from family (providing distraction and holding  pt's hands). PIV wrapped with kerlix, spoke with primary RN, recommending mittens as another layer of protect against patient pulling lines.

## 2022-09-18 NOTE — Progress Notes (Signed)
Patient became super aggressive demanding to get out of bed and go into the other room. Patient yelling and screaming for his wife. Patient pushing staff to try to get out of bed. Received a one time order for Ge[employer]. Called security to bedside to assist with administering med and getting patient back to bed.

## 2022-09-18 NOTE — Anesthesia Procedure Notes (Signed)
Procedure Name: Intubation Date/Time: 09/18/2022 4:44 PM  Performed by: Cheree Ditto, CRNAPre-anesthesia Checklist: Patient identified, Emergency Drugs available, Suction available and Patient being monitored Patient Re-evaluated:Patient Re-evaluated prior to induction Oxygen Delivery Method: Circle system utilized Preoxygenation: Pre-oxygenation with 100% oxygen Induction Type: IV induction Ventilation: Mask ventilation without difficulty Laryngoscope Size: Mac and 3 Grade View: Grade II Tube type: Oral Tube size: 8.0 mm Number of attempts: 1 Airway Equipment and Method: Stylet and Oral airway Placement Confirmation: ETT inserted through vocal cords under direct vision, positive ETCO2 and breath sounds checked- equal and bilateral Secured at: 21 cm Tube secured with: Tape Dental Injury: Teeth and Oropharynx as per pre-operative assessment

## 2022-09-18 NOTE — Progress Notes (Signed)
PT Cancellation Note  Patient Details Name: Johnny [name] MRN: [id number] DOB: 1946/01/13   Cancelled Treatment:    Reason Eval/Treat Not Completed: Patient at procedure or test/unavailable;Other (comment). Pt very confused this AM and now in OR.   Angelina Ok [facility] 09/18/2022, 4:04 PM Skip Mayer PT Acute [employer] [phone]

## 2022-09-18 NOTE — Progress Notes (Signed)
Patient's son, Johnny [name], Johnny [name] was called and asked if the patient can receive blood in case of an emergency. Johnny [name] states that he is OK with his father receiving blood in case he will need.

## 2022-09-18 NOTE — Progress Notes (Signed)
5/24 Patient out of room for procedure. IMM Letter will be mailed to patient's address on file.

## 2022-09-18 NOTE — Progress Notes (Addendum)
    [facility] has been requested to perform a transesophageal echocardiogram on 09/18/2022 for bacteremia.  After careful review of history and examination, the risks and benefits of transesophageal echocardiogram have been explained including risks of esophageal damage, perforation (1:10,000 risk), bleeding, pharyngeal hematoma as well as other potential complications associated with conscious sedation including aspiration, arrhythmia, respiratory failure and death. Alternatives to treatment were discussed, questions were answered. The procedure was discussed with the patient's son, corey Beougher and consent obtained. Patient currently encephalopathic this morning.  TEE to be completed in the OR today with scheduled I&D with Dr. Thea Silversmith, NP-C 09/18/2022 9:25 AM

## 2022-09-18 NOTE — Assessment & Plan Note (Signed)
Last night patient with agitation and confusion, positive delirium.  He received ziprasidone 20 mg IM, and 50 mg quetiapine last night.  This am continue confused and delirious.   Plan to continue neuro checks per unit protocol, aspiration precautions.  Needs source infection control, with right knee I&D.  Will add haloperidol as needed for agitation, continue with tele-sitter.

## 2022-09-18 NOTE — Brief Op Note (Signed)
09/18/2022  5:55 PM  PATIENT:  Johnny [name]  77 y.o. male  PRE-OPERATIVE DIAGNOSIS:  Rigjt Knee infection  POST-OPERATIVE DIAGNOSIS:  Right Knee infection  PROCEDURE:  Procedure(s): IRRIGATION AND DEBRIDEMENT KNEE WITH POLY EXCHANGE (Right) TRANSESOPHAGEAL ECHOCARDIOGRAM (N/A)  SURGEON:  Surgeon(s) and Role: Panel 1:    Ollen Gross, MD - Primary Panel 2:    * Bensimhon, Bevelyn Buckles, MD - Primary  PHYSICIAN ASSISTANT:   ASSISTANTS: Arther Abbott, PA-C   ANESTHESIA:   general  EBL:  25 ml  BLOOD ADMINISTERED:none  DRAINS: none   LOCAL MEDICATIONS USED:  NONE  COUNTS:  YES  TOURNIQUET:   Total Tourniquet Time Documented: Thigh (laterality) - 24 minutes Total: Thigh (laterality) - 24 minutes   DICTATION: .Other Dictation: Dictation Number [id number]  PLAN OF CARE: Admit to inpatient   PATIENT DISPOSITION:  PACU - hemodynamically stable.

## 2022-09-19 ENCOUNTER — Encounter (HOSPITAL_COMMUNITY)
Admission: EM | Disposition: A | Discharge status: Hospice | Payer: Self-pay | Source: Home / Self Care | Attending: Internal Medicine

## 2022-09-19 ENCOUNTER — Inpatient Hospital Stay (HOSPITAL_COMMUNITY): Payer: Medicare Other

## 2022-09-19 DIAGNOSIS — I358 Other nonrheumatic aortic valve disorders: Secondary | ICD-10-CM

## 2022-09-19 DIAGNOSIS — N1831 Chronic kidney disease, stage 3a: Secondary | ICD-10-CM | POA: Diagnosis not present

## 2022-09-19 DIAGNOSIS — I719 Aortic aneurysm of unspecified site, without rupture: Secondary | ICD-10-CM | POA: Diagnosis not present

## 2022-09-19 DIAGNOSIS — I7100 Dissection of unspecified site of aorta: Secondary | ICD-10-CM

## 2022-09-19 DIAGNOSIS — I25810 Atherosclerosis of coronary artery bypass graft(s) without angina pectoris: Secondary | ICD-10-CM | POA: Diagnosis not present

## 2022-09-19 DIAGNOSIS — B9561 Methicillin susceptible Staphylococcus aureus infection as the cause of diseases classified elsewhere: Secondary | ICD-10-CM

## 2022-09-19 DIAGNOSIS — I330 Acute and subacute infective endocarditis: Secondary | ICD-10-CM | POA: Diagnosis not present

## 2022-09-19 DIAGNOSIS — I76 Septic arterial embolism: Secondary | ICD-10-CM

## 2022-09-19 DIAGNOSIS — I129 Hypertensive chronic kidney disease with stage 1 through stage 4 chronic kidney disease, or unspecified chronic kidney disease: Secondary | ICD-10-CM

## 2022-09-19 DIAGNOSIS — M00061 Staphylococcal arthritis, right knee: Secondary | ICD-10-CM | POA: Diagnosis not present

## 2022-09-19 DIAGNOSIS — Z515 Encounter for palliative care: Secondary | ICD-10-CM

## 2022-09-19 DIAGNOSIS — R7881 Bacteremia: Secondary | ICD-10-CM | POA: Diagnosis not present

## 2022-09-19 DIAGNOSIS — I714 Abdominal aortic aneurysm, without rupture, unspecified: Secondary | ICD-10-CM | POA: Diagnosis not present

## 2022-09-19 DIAGNOSIS — I7101 Dissection of ascending aorta: Secondary | ICD-10-CM | POA: Diagnosis not present

## 2022-09-19 LAB — CBC WITH DIFFERENTIAL/PLATELET
Abs Immature Granulocytes: 0.48 10*3/uL — ABNORMAL HIGH (ref 0.00–0.07)
Basophils Absolute: 0 10*3/uL (ref 0.0–0.1)
Basophils Relative: 0 %
Eosinophils Absolute: 0 10*3/uL (ref 0.0–0.5)
Eosinophils Relative: 0 %
HCT: 28.7 % — ABNORMAL LOW (ref 39.0–52.0)
Hemoglobin: 9.5 g/dL — ABNORMAL LOW (ref 13.0–17.0)
Immature Granulocytes: 3 %
Lymphocytes Relative: 4 %
Lymphs Abs: 0.6 10*3/uL — ABNORMAL LOW (ref 0.7–4.0)
MCH: 32.5 pg (ref 26.0–34.0)
MCHC: 33.1 g/dL (ref 30.0–36.0)
MCV: 98.3 fL (ref 80.0–100.0)
Monocytes Absolute: 0.5 10*3/uL (ref 0.1–1.0)
Monocytes Relative: 3 %
Neutro Abs: 15.3 10*3/uL — ABNORMAL HIGH (ref 1.7–7.7)
Neutrophils Relative %: 90 %
Platelets: 421 10*3/uL — ABNORMAL HIGH (ref 150–400)
RBC: 2.92 MIL/uL — ABNORMAL LOW (ref 4.22–5.81)
RDW: 13.7 % (ref 11.5–15.5)
WBC: 16.9 10*3/uL — ABNORMAL HIGH (ref 4.0–10.5)
nRBC: 0 % (ref 0.0–0.2)

## 2022-09-19 LAB — BASIC METABOLIC PANEL
Anion gap: 11 (ref 5–15)
BUN: 34 mg/dL — ABNORMAL HIGH (ref 8–23)
CO2: 24 mmol/L (ref 22–32)
Calcium: 8.9 mg/dL (ref 8.9–10.3)
Chloride: 101 mmol/L (ref 98–111)
Creatinine, Ser: 1.33 mg/dL — ABNORMAL HIGH (ref 0.61–1.24)
GFR, Estimated: 55 mL/min — ABNORMAL LOW (ref >60)
Glucose, Bld: 171 mg/dL — ABNORMAL HIGH (ref 70–99)
Potassium: 4.2 mmol/L (ref 3.5–5.1)
Sodium: 136 mmol/L (ref 135–145)

## 2022-09-19 LAB — GLUCOSE, CAPILLARY
Glucose-Capillary: 164 mg/dL — ABNORMAL HIGH (ref 70–99)
Glucose-Capillary: 156 mg/dL — ABNORMAL HIGH (ref 70–99)
Glucose-Capillary: 208 mg/dL — ABNORMAL HIGH (ref 70–99)
Glucose-Capillary: 132 mg/dL — ABNORMAL HIGH (ref 70–99)

## 2022-09-19 LAB — POCT ACTIVATED CLOTTING TIME: Activated Clotting Time: 244 seconds

## 2022-09-19 LAB — CULTURE, BLOOD (ROUTINE X 2)

## 2022-09-19 SURGERY — LEFT HEART CATH AND CORS/GRAFTS ANGIOGRAPHY
Anesthesia: LOCAL

## 2022-09-19 MED ORDER — AMLODIPINE BESYLATE 5 MG PO TABS
5.0000 mg | ORAL_TABLET | Freq: Every day | ORAL | Status: DC
  Administered 2022-09-19 – 2022-09-24 (×6): 5 mg via ORAL
  Filled 2022-09-19 (×6): qty 1

## 2022-09-19 MED ORDER — METOPROLOL TARTRATE 5 MG/5ML IV SOLN
5.0000 mg | INTRAVENOUS | Status: AC
  Administered 2022-09-19 (×2): 5 mg via INTRAVENOUS
  Filled 2022-09-19 (×2): qty 5

## 2022-09-19 MED ORDER — METOPROLOL TARTRATE 5 MG/5ML IV SOLN
5.0000 mg | Freq: Once | INTRAVENOUS | Status: AC
  Administered 2022-09-19: 5 mg via INTRAVENOUS
  Filled 2022-09-19: qty 5

## 2022-09-19 MED ORDER — METOPROLOL TARTRATE 25 MG PO TABS
25.0000 mg | ORAL_TABLET | Freq: Two times a day (BID) | ORAL | Status: DC
  Administered 2022-09-19 – 2022-09-24 (×11): 25 mg via ORAL
  Filled 2022-09-19 (×11): qty 1

## 2022-09-19 MED ORDER — IOHEXOL 350 MG/ML SOLN
75.0000 mL | Freq: Once | INTRAVENOUS | Status: AC | PRN
  Administered 2022-09-19: 75 mL via INTRAVENOUS

## 2022-09-19 NOTE — Progress Notes (Addendum)
Subjective:  Patient was perseverating about his wife and concerns about her health and then seemed confused about different surgeries he had had   Antibiotics:  Anti-infectives (From admission, onward)    Start     Dose/Rate Route Frequency Ordered Stop   09/19/22 0600  ceFAZolin (ANCEF) IVPB 2g/100 mL premix        2 g 200 mL/hr over 30 Minutes Intravenous On call to O.R. 09/18/22 1129 09/18/22 1657   09/18/22 1100  nafcillin 12 g in sodium chloride 0.9 % 500 mL continuous infusion        12 g 20.8 mL/hr over 24 Hours Intravenous Every 24 hours 09/18/22 1006     09/16/22 1030  ceFAZolin (ANCEF) IVPB 2g/100 mL premix  Status:  Discontinued        2 g 200 mL/hr over 30 Minutes Intravenous Every 8 hours 09/16/22 0944 09/18/22 1006   09/15/22 1830  cefTRIAXone (ROCEPHIN) 2 g in sodium chloride 0.9 % 100 mL IVPB  Status:  Discontinued        2 g 200 mL/hr over 30 Minutes Intravenous Every 24 hours 09/15/22 1736 09/16/22 0944       Medications: Scheduled Meds:  acetaminophen  1,000 mg Oral Q6H   amLODipine  5 mg Oral Daily   aspirin EC  81 mg Oral Daily   diclofenac Sodium  4 g Topical QID   docusate sodium  100 mg Oral BID   ezetimibe  10 mg Oral Daily   famotidine  40 mg Oral Daily   feeding supplement  237 mL Oral TID BM   insulin aspart  0-5 Units Subcutaneous QHS   insulin aspart  0-9 Units Subcutaneous TID WC   metoprolol tartrate  25 mg Oral BID   multivitamin with minerals  1 tablet Oral Daily   simvastatin  40 mg Oral Daily   Continuous Infusions:  sodium chloride Stopped (09/18/22 1333)   sodium chloride 75 mL/hr at 09/19/22 0700   methocarbamol (ROBAXIN) IV     nafcillin 12 g in sodium chloride 0.9 % 500 mL continuous infusion Stopped (09/18/22 1302)   thiamine (VITAMIN B1) injection Stopped (09/17/22 1913)   PRN Meds:.acetaminophen, alum & mag hydroxide-simeth, bisacodyl, diphenhydrAMINE, haloperidol **OR** haloperidol lactate, melatonin,  menthol-cetylpyridinium **OR** phenol, methocarbamol **OR** methocarbamol (ROBAXIN) IV, metoCLOPramide **OR** metoCLOPramide (REGLAN) injection, morphine injection, ondansetron **OR** ondansetron (ZOFRAN) IV, oxyCODONE, polyethylene glycol, sodium phosphate, traMADol    Objective: Weight change: 0.72 kg  Intake/Output Summary (Last 24 hours) at 09/19/2022 1150 Last data filed at 09/19/2022 0700 Gross per 24 hour  Intake 1506.08 ml  Output 545 ml  Net 961.08 ml   Blood pressure 132/75, pulse 70, temperature 97.6 F (36.4 C), temperature source Oral, resp. rate 20, height 5\' 7"  (1.702 m), weight 94.6 kg, SpO2 98 %. Temp:  [97.6 F (36.4 C)-98 F (36.7 C)] 97.6 F (36.4 C) (05/25 0802) Pulse Rate:  [63-89] 70 (05/25 0833) Resp:  [14-28] 20 (05/25 0802) BP: (116-147)/(62-87) 132/75 (05/25 0832) SpO2:  [90 %-100 %] 98 % (05/25 0802) Weight:  [94.6 kg] 94.6 kg (05/25 0341)  Physical Exam: Physical Exam HENT:     Head: Normocephalic and atraumatic.  Eyes:     General:        Right eye: No discharge.        Left eye: No discharge.     Extraocular Movements: Extraocular movements intact.  Cardiovascular:     Rate and Rhythm: Normal  rate.     Heart sounds: No murmur heard.    No gallop.  Pulmonary:     Effort: No respiratory distress.     Breath sounds: No stridor. No wheezing, rhonchi or rales.  Abdominal:     General: Abdomen is flat. There is no distension.  Skin:    General: Skin is warm and dry.  Neurological:     Mental Status: He is alert. He is disoriented.  Psychiatric:        Mood and Affect: Mood is depressed.        Speech: Speech is delayed and tangential.        Behavior: Behavior is cooperative.        Cognition and Memory: Cognition is impaired. Memory is impaired. He exhibits impaired recent memory and impaired remote memory.      CBC:    BMET Recent Labs    09/18/22 0645 09/18/22 2143 09/19/22 0205  NA 133*  --  136  K 3.6  --  4.2  CL 96*  --   101  CO2 22  --  24  GLUCOSE 152*  --  171*  BUN 33*  --  34*  CREATININE 1.43* 1.34* 1.33*  CALCIUM 9.7  --  8.9     Liver Panel  No results for input(s): "PROT", "ALBUMIN", "AST", "ALT", "ALKPHOS", "BILITOT", "BILIDIR", "IBILI" in the last 72 hours.     Sedimentation Rate No results for input(s): "ESRSEDRATE" in the last 72 hours. C-Reactive Protein No results for input(s): "CRP" in the last 72 hours.  Micro Results: Recent Results (from the past 720 hour(s))  SARS Coronavirus 2 by RT PCR (hospital order, performed in [facility] hospital lab) *cepheid single result test* Anterior Nasal Swab     Status: None   Collection Time: 09/12/22  2:14 PM   Specimen: Anterior Nasal Swab  Result Value Ref Range Status   SARS Coronavirus 2 by RT PCR NEGATIVE NEGATIVE Final    Comment: (NOTE) SARS-CoV-2 target nucleic acids are NOT DETECTED.  The SARS-CoV-2 RNA is generally detectable in upper and lower respiratory specimens during the acute phase of infection. The lowest concentration of SARS-CoV-2 viral copies this assay can detect is 250 copies / mL. A negative result does not preclude SARS-CoV-2 infection and should not be used as the sole basis for treatment or other patient management decisions.  A negative result may occur with improper specimen collection / handling, submission of specimen other than nasopharyngeal swab, presence of viral mutation(s) within the areas targeted by this assay, and inadequate number of viral copies (<250 copies / mL). A negative result must be combined with clinical observations, patient history, and epidemiological information.  Fact Sheet for Patients:   RoadLapTop.co.za  Fact Sheet for Healthcare Providers: http://kim-miller.com/  This test is not yet approved or  cleared by the [place] FDA and has been authorized for detection and/or diagnosis of SARS-CoV-2 by FDA under an Emergency Use  Authorization (EUA).  This EUA will remain in effect (meaning this test can be used) for the duration of the COVID-19 declaration under Section 564(b)(1) of the Act, 21 U.S.C. section 360bbb-3(b)(1), unless the authorization is terminated or revoked sooner.  Performed at [facility], [address] [place], [place] [address]   MRSA Next Gen by PCR, Nasal     Status: None   Collection Time: 09/13/22  5:15 AM   Specimen: Nasal Mucosa; Nasal Swab  Result Value Ref Range Status  MRSA by PCR Next Gen NOT DETECTED NOT DETECTED Final    Comment: (NOTE) The GeneXpert MRSA Assay (FDA approved for NASAL specimens only), is one component of a comprehensive MRSA colonization surveillance program. It is not intended to diagnose MRSA infection nor to guide or monitor treatment for MRSA infections. Test performance is not FDA approved in patients less than 42 years old. Performed at [facility], [address], [place], [place] [address]   Culture, blood (Routine X 2) w Reflex to ID Panel     Status: Abnormal   Collection Time: 09/15/22  5:55 PM   Specimen: BLOOD RIGHT ARM  Result Value Ref Range Status   Specimen Description BLOOD RIGHT ARM  Final   Special Requests   Final    BOTTLES DRAWN AEROBIC AND ANAEROBIC Blood Culture adequate volume   Culture  Setup Time   Final    GRAM POSITIVE COCCI IN CLUSTERS IN BOTH AEROBIC AND ANAEROBIC BOTTLES CRITICAL VALUE NOTED.  VALUE IS CONSISTENT WITH PREVIOUSLY REPORTED AND CALLED VALUE.    Culture (A)  Final    STAPHYLOCOCCUS AUREUS SUSCEPTIBILITIES PERFORMED ON PREVIOUS CULTURE WITHIN THE LAST 5 DAYS. Performed at [facility], [address], [place], [place] [address]    Report Status 09/18/2022 FINAL  Final  Culture, blood (Routine X 2) w Reflex to ID Panel     Status: Abnormal   Collection Time: 09/15/22  5:55 PM   Specimen: BLOOD RIGHT ARM  Result Value Ref Range Status   Specimen Description BLOOD RIGHT ARM   Final   Special Requests   Final    BOTTLES DRAWN AEROBIC AND ANAEROBIC Blood Culture adequate volume   Culture  Setup Time   Final    GRAM POSITIVE COCCI IN CLUSTERS IN BOTH AEROBIC AND ANAEROBIC BOTTLES CRITICAL RESULT CALLED TO, READ BACK BY AND VERIFIED WITH: A. PAYTES PHARMD, AT [id number] 09/16/22 Renato Shin Performed at [facility], [address] [place], [place] [address]    Culture STAPHYLOCOCCUS AUREUS (A)  Final   Report Status 09/18/2022 FINAL  Final   Organism ID, Bacteria STAPHYLOCOCCUS AUREUS  Final      Susceptibility   Staphylococcus aureus - MIC*    CIPROFLOXACIN <=0.5 SENSITIVE Sensitive     ERYTHROMYCIN <=0.25 SENSITIVE Sensitive     GENTAMICIN <=0.5 SENSITIVE Sensitive     OXACILLIN 0.5 SENSITIVE Sensitive     TETRACYCLINE <=1 SENSITIVE Sensitive     VANCOMYCIN <=0.5 SENSITIVE Sensitive     TRIMETH/SULFA <=10 SENSITIVE Sensitive     CLINDAMYCIN <=0.25 SENSITIVE Sensitive     RIFAMPIN <=0.5 SENSITIVE Sensitive     Inducible Clindamycin NEGATIVE Sensitive     LINEZOLID 2 SENSITIVE Sensitive     * STAPHYLOCOCCUS AUREUS  Blood Culture ID Panel (Reflexed)     Status: Abnormal   Collection Time: 09/15/22  5:55 PM  Result Value Ref Range Status   Enterococcus faecalis NOT DETECTED NOT DETECTED Final   Enterococcus Faecium NOT DETECTED NOT DETECTED Final   Listeria monocytogenes NOT DETECTED NOT DETECTED Final   Staphylococcus species DETECTED (A) NOT DETECTED Final    Comment: CRITICAL RESULT CALLED TO, READ BACK BY AND VERIFIED WITH: A. PAYTES PHARMD, AT 2952 09/16/22 D. VANHOOK    Staphylococcus aureus (BCID) DETECTED (A) NOT DETECTED Final    Comment: CRITICAL RESULT CALLED TO, READ BACK BY AND VERIFIED WITH: A. PAYTES PHARMD, AT 8413 09/16/22 D. VANHOOK    Staphylococcus epidermidis NOT DETECTED  NOT DETECTED Final   Staphylococcus lugdunensis NOT DETECTED NOT DETECTED Final   Streptococcus species NOT DETECTED NOT DETECTED Final   Streptococcus agalactiae  NOT DETECTED NOT DETECTED Final   Streptococcus pneumoniae NOT DETECTED NOT DETECTED Final   Streptococcus pyogenes NOT DETECTED NOT DETECTED Final   A.calcoaceticus-baumannii NOT DETECTED NOT DETECTED Final   Bacteroides fragilis NOT DETECTED NOT DETECTED Final   Enterobacterales NOT DETECTED NOT DETECTED Final   Enterobacter cloacae complex NOT DETECTED NOT DETECTED Final   Escherichia coli NOT DETECTED NOT DETECTED Final   Klebsiella aerogenes NOT DETECTED NOT DETECTED Final   Klebsiella oxytoca NOT DETECTED NOT DETECTED Final   Klebsiella pneumoniae NOT DETECTED NOT DETECTED Final   Proteus species NOT DETECTED NOT DETECTED Final   Salmonella species NOT DETECTED NOT DETECTED Final   Serratia marcescens NOT DETECTED NOT DETECTED Final   Haemophilus influenzae NOT DETECTED NOT DETECTED Final   Neisseria meningitidis NOT DETECTED NOT DETECTED Final   Pseudomonas aeruginosa NOT DETECTED NOT DETECTED Final   Stenotrophomonas maltophilia NOT DETECTED NOT DETECTED Final   Candida albicans NOT DETECTED NOT DETECTED Final   Candida auris NOT DETECTED NOT DETECTED Final   Candida glabrata NOT DETECTED NOT DETECTED Final   Candida krusei NOT DETECTED NOT DETECTED Final   Candida parapsilosis NOT DETECTED NOT DETECTED Final   Candida tropicalis NOT DETECTED NOT DETECTED Final   Cryptococcus neoformans/gattii NOT DETECTED NOT DETECTED Final   Meth resistant mecA/C and MREJ NOT DETECTED NOT DETECTED Final    Comment: Performed at [facility], [address] [place], [place] [address]  Body fluid culture w Gram Stain     Status: None   Collection Time: 09/16/22  3:59 PM   Specimen: Synovium; Body Fluid  Result Value Ref Range Status   Specimen Description SYNOVIAL  Final   Special Requests RIGHT KNEE  Final   Gram Stain   Final    ABUNDANT WBC PRESENT, PREDOMINANTLY PMN FEW GRAM POSITIVE COCCI IN PAIRS CRITICAL RESULT CALLED TO, READ BACK BY AND VERIFIED WITH: RN PEYTON RIDGE ON  09/16/22 @ 1647 BY DRT Performed at [facility], [address], [place], [place] [address]    Culture ABUNDANT STAPHYLOCOCCUS AUREUS  Final   Report Status 09/18/2022 FINAL  Final   Organism ID, Bacteria STAPHYLOCOCCUS AUREUS  Final      Susceptibility   Staphylococcus aureus - MIC*    CIPROFLOXACIN <=0.5 SENSITIVE Sensitive     ERYTHROMYCIN <=0.25 SENSITIVE Sensitive     GENTAMICIN <=0.5 SENSITIVE Sensitive     OXACILLIN <=0.25 SENSITIVE Sensitive     TETRACYCLINE <=1 SENSITIVE Sensitive     VANCOMYCIN <=0.5 SENSITIVE Sensitive     TRIMETH/SULFA <=10 SENSITIVE Sensitive     CLINDAMYCIN <=0.25 SENSITIVE Sensitive     RIFAMPIN <=0.5 SENSITIVE Sensitive     Inducible Clindamycin NEGATIVE Sensitive     LINEZOLID 2 SENSITIVE Sensitive     * ABUNDANT STAPHYLOCOCCUS AUREUS  Culture, blood (Routine X 2) w Reflex to ID Panel     Status: None (Preliminary result)   Collection Time: 09/18/22  6:31 AM   Specimen: BLOOD  Result Value Ref Range Status   Specimen Description BLOOD RIGHT ANTECUBITAL  Final   Special Requests   Final    BOTTLES DRAWN AEROBIC AND ANAEROBIC Blood Culture results may not be optimal due to an inadequate volume of blood received in culture bottles   Culture   Final    NO GROWTH  1 DAY Performed at [facility], [address], [place], [place] [address]    Report Status PENDING  Incomplete  Culture, blood (Routine X 2) w Reflex to ID Panel     Status: None (Preliminary result)   Collection Time: 09/18/22  6:42 AM   Specimen: BLOOD RIGHT ARM  Result Value Ref Range Status   Specimen Description BLOOD RIGHT ARM  Final   Special Requests   Final    BOTTLES DRAWN AEROBIC ONLY Blood Culture adequate volume   Culture   Final    NO GROWTH 1 DAY Performed at [facility], [address] [place], [place] [address]    Report Status PENDING  Incomplete    Studies/Results: CT ANGIO CHEST AORTA W/CM & OR WO/CM  Result Date: 09/19/2022 CLINICAL DATA:   Aortic infection or inflammation. EXAM: CT ANGIOGRAPHY CHEST WITH CONTRAST TECHNIQUE: Multidetector CT imaging of the chest was performed using the standard protocol during bolus administration of intravenous contrast. Multiplanar CT image reconstructions and MIPs were obtained to evaluate the vascular anatomy. RADIATION DOSE REDUCTION: This exam was performed according to the departmental dose-optimization program which includes automated exposure control, adjustment of the mA and/or kV according to patient size and/or use of iterative reconstruction technique. 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 No dissection propagation into the descending aorta or the arch vessels is seen. At the level of the left inferior pulmonary vein, at the contours of the descending aorta are markedly irregular and there is surrounding intermediate density material. This is concerning for a contained aortic rupture or large penetrating atheromatous ulcer. This appearance is new compared to 07/21/2022. Mediastinum/Nodes: No mediastinal, hilar or axillary lymphadenopathy. Normal visualized thyroid. Thoracic esophageal course is normal. Lungs/Pleura: There are bilateral pleural effusions. Upper Abdomen: Contrast bolus timing is not optimized for evaluation of the abdominal organs. The visualized portions of the organs of the upper abdomen are normal. Musculoskeletal: No chest wall abnormality. No bony spinal canal stenosis. Review of the MIP images confirms the above findings. IMPRESSION: 1. 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 2. 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 Vascular surgery consultation  recommended. 3. Bilateral small pleural effusions. Critical Value/emergent results were called by telephone at the time of interpretation on 09/19/2022 at 2:08 am to Dr. Imogene Burn, who verbally acknowledged these results. Electronically Signed   By: Deatra Robinson M.D.   On: 09/19/2022 02:09   EP STUDY  Result Date: 09/18/2022 See surgical note for result.  ECHO TEE  Result Date: 09/18/2022    TRANSESOPHOGEAL ECHO REPORT   Patient Name:   PARVIN CRUZE Date of Exam: 09/18/2022 Medical Rec #:  [id number]       Height:       67.0 in Accession #:    [id number]      Weight:       207.0 lb Date of Birth:  1946/04/27       BSA:          2.052 m Patient Age:    76 years        BP:           134/76 mmHg Patient Gender: M  HR:           85 bpm. Exam Location:  Inpatient Procedure: Transesophageal Echo, Cardiac Doppler and Color Doppler Indications:     CHF  History:         Patient has prior history of Echocardiogram examinations, most                  recent 09/16/2022. Aortic Valve Disease; Risk                  Factors:Hypertension.  Sonographer:     Dondra Prader RVT Referring Phys:  [id number] Lillia Abed B ROBERTS Diagnosing Phys: Arvilla Meres MD PROCEDURE: The transesophogeal probe was passed without difficulty through the esophogus of the patient. Sedation performed by different physician. The patient developed no complications during the procedure.  IMPRESSIONS  1. Left ventricular ejection fraction, by estimation, is 55 to 60%. The left ventricle has normal function. The left ventricle demonstrates regional wall motion abnormalities (see scoring diagram/findings for description).  2. Right ventricular systolic function is mildly reduced. The right ventricular size is normal.  3. LAA not visulaized. No left atrial/left atrial appendage thrombus was detected.  4. No mitral valve vegetation. The mitral valve is normal in structure. Mild mitral valve regurgitation.  5. No tricuspid valve vegetation.  6. There is  a small mobile vegetation on the aortic side of the valve with what appears to be surrounding abscess formation and dissection flap in the aortic root. The aortic valve is tricuspid. There is mild calcification of the aortic valve. Aortic valve regurgitation is mild. No aortic stenosis is present.  7. No pulmonic valve vegetation.  8. There is edema in the tissues surrounding the aortic valve as well as a dissection flap in the aortic root with what appears to be abscess formation or a false lumen. Given clinical picture I have high suspicion for abscess formation. Recommend CT imaging for further evaluation. D/w ID team. Aortic dilatation noted. There is mild dilatation of the aortic root, measuring 41 mm. FINDINGS  Left Ventricle: Left ventricular ejection fraction, by estimation, is 55 to 60%. The left ventricle has normal function. The left ventricle demonstrates regional wall motion abnormalities. The left ventricular internal cavity size was normal in size.  LV Wall Scoring: The mid inferoseptal segment and apical septal segment are hypokinetic. Right Ventricle: The right ventricular size is normal. No increase in right ventricular wall thickness. Right ventricular systolic function is mildly reduced. Left Atrium: LAA not visulaized. Left atrial size was normal in size. No left atrial/left atrial appendage thrombus was detected. Right Atrium: Right atrial size was normal in size. Pericardium: There is no evidence of pericardial effusion. Mitral Valve: No mitral valve vegetation. The mitral valve is normal in structure. Mild mitral valve regurgitation. Tricuspid Valve: No tricuspid valve vegetation. The tricuspid valve is normal in structure. Tricuspid valve regurgitation is trivial. Aortic Valve: There is a small mobile vegetation on the aortic side of the valve with what appears to be surrounding abscess formation and dissection flap in the aortic root. The aortic valve is tricuspid. There is mild  calcification of the aortic valve.  Aortic valve regurgitation is mild. No aortic stenosis is present. Pulmonic Valve: No pulmonic valve vegetation. The pulmonic valve was normal in structure. Pulmonic valve regurgitation is trivial. Aorta: There is edema in the tissues surrounding the aortic valve as well as a dissection flap in the aortic root with what appears to be abscess formation or a false lumen. Given  clinical picture I have high suspicion for abscess formation. Recommend CT  imaging for further evaluation. D/w ID team. Aortic dilatation noted. There is mild dilatation of the aortic root, measuring 41 mm. IAS/Shunts: The interatrial septum appears to be lipomatous. No atrial level shunt detected by color flow Doppler. 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 He underwent I&D with poly exchange yesterday in the operating room at which point time he also underwent a transesophageal echocardiogram.  Dr. Doy Hutching with cardiothoracic surgery has evaluated the patient and has discussed with cardiology  and AHF team and reached consensus that pt would not survive any attempt in the operating room to a repair of the ruptured abscess dissection of his aortic root.  We will continue nafcillin for now since there is high concern for CNS septic embolism I would NOT pursue MRI brain  Palliative care consult will be crucial  I am in favor to moving to comfort measures  Certainly we can continue to give nafcillin, or cefazolin or even switch to oral antibiotics if antibiotic therapy is desired.  Antibiotics CANNOT fix his aortic abscess and dissection but have stabilized his bacteremia at present  I have personally spent 52 minutes involved in face-to-face and non-face-to-face activities for this patient on the day of the visit. Professional time spent includes the following activities: Preparing to see the patient (review of tests), Obtaining and/or reviewing separately obtained history (admission/discharge record), Performing a medically appropriate examination and/or evaluation , Ordering medications/tests/procedures, referring and communicating with other health care professionals, Documenting clinical information in the EMR, Independently interpreting results (not separately reported), Communicating results to the patient/family/caregiver, Counseling and educating the patient/family/caregiver and Care coordination (not separately reported).    LOS: 6 days   Acey Lav 09/19/2022, 11:50 AM

## 2022-09-19 NOTE — Consult Note (Signed)
Palliative Medicine Inpatient Consult Note  Consulting Provider: Dr. Jomarie Longs  Reason for consult:   Palliative Care Consult Services Palliative Medicine Consult  Reason for Consult? Goals of care, high mortality risk   09/19/2022  HPI:  Per intake H&P --> 76/M w CAD/CABG, abdominal aortic aneurysm, hypertension, dyslipidemia presented to the ED with dyspneayo male with the past medical history of coronary artery disease, sp CABG, abdominal aortic aneurysm, hypertension, obesity and dyslipidemia who presented with dyspnea. Has had a complicated hospitalization and is now in a position whereby he has a descending aortic aneurysm, aortic valve abscess, endocarditis, and bacteremia - MSSA. Palliative care has been asked to get involved for further conversations related to goals of care in the setting of severe illness and un-repairable dissection of aortic root.   Clinical Assessment/Goals of Care:  *Please note that this is a verbal dictation therefore any spelling or grammatical errors are due to the "[employer]" system interpretation.  I have reviewed medical records including EPIC notes, labs and imaging, received report from bedside RN, assessed the patient.    I met with *** to further discuss diagnosis prognosis, GOC, EOL wishes, disposition and options.   I introduced Palliative Medicine as specialized medical care for people living with serious illness. It focuses on providing relief from the symptoms and stress of a serious illness. The goal is to improve quality of life for both the patient and the family.  Medical History Review and Understanding:    Social History:    Functional and Nutritional State:    Palliative Symptoms:    Advance Directives: A detailed discussion was had today regarding advanced directives.    Code Status: Concepts specific to code status, artifical feeding and hydration, continued IV antibiotics and rehospitalization was had.  The  difference between a aggressive medical intervention path  and a palliative comfort care path for this patient at this time was had.   Encouraged patient/family to consider DNR/DNI status understanding evidenced based poor outcomes in similar hospitalized patient, as the cause of arrest is likely associated with advanced chronic/terminal illness rather than an easily reversible acute cardio-pulmonary event. I explained that DNR/DNI does not change the medical plan and it only comes into effect after a person has arrested (died).  It is a protective measure to keep [place] from harming the patient in their last moments of life. *** was agreeable to DNR/DNI with understanding that patient would not receive CPR, defibrillation, ACLS medications, or intubation.   Discussion:    Discussed the importance of continued conversation with family and their  medical providers regarding overall plan of care and treatment options, ensuring decisions are within the context of the patients values and GOCs.  Provided *** "Hard Choices for Loving People" booklet.   Provided *** "Gone From My Site" booklet.  Decision Maker:  SUMMARY OF RECOMMENDATIONS    Code Status/Advance Care Planning: FULL CODE  DNAR/DNI  Modified CODE   Symptom Management:   Palliative Prophylaxis:  Aspiration, Bowel Regimen, Delirium Protocol, Frequent Pain Assessment, Oral Care, Palliative Wound Care, and Turn Reposition  Additional Recommendations (Limitations, Scope, Preferences): Avoid Hospitalization, Full Scope Treatment, No Artificial Feeding, No Surgical Procedures, and No Tracheostomy  Psycho-social/Spiritual:  Desire for further Chaplaincy support:  Additional Recommendations:    Prognosis:   Discharge Planning:   ROS  Oral Intake %:   I/O:   Bowel Movements:   Mobility:   Vitals:   09/19/22 0832 09/19/22 0833  BP: 132/75   Pulse:  70  Resp:    Temp:    SpO2:      Intake/Output Summary (Last 24 hours)  at 09/19/2022 1622 Last data filed at 09/19/2022 1510 Gross per 24 hour  Intake 1980.35 ml  Output 545 ml  Net 1435.35 ml   Last Weight  Most recent update: 09/19/2022  3:57 AM    Weight  94.6 kg (208 lb 9.6 oz)             Gen:  NAD HEENT: moist mucous membranes CV: Regular rate and rhythm, no murmurs rubs or gallops PULM: clear to auscultation bilaterally. No wheezes/rales/rhonchi*** ABD: soft/nontender/nondistended/normal bowel sounds*** EXT: No edema*** Neuro: Alert and oriented x3***  PPS:   This conversation/these recommendations were discussed with patient primary care team, Dr. Marland Kitchen  Time In: Time Out: Total Time: ***  Billing based on MDM: ***  {Problems Addressed:304933}  {Amount and/or Complexity of ZOXW:960454}  {Risks:304936} ______________________________________________________ [name] [place] [facility] Palliative Medicine Team Team Cell Phone: [phone] Please utilize secure chat with additional questions, if there is no response within 30 minutes please call the above phone number  Palliative Medicine Team providers are available by phone from 7am to 7pm daily and can be reached through the team cell phone.  Should this patient require assistance outside of these hours, please call the patient's attending physician.

## 2022-09-19 NOTE — Progress Notes (Addendum)
PROGRESS NOTE    Johnny [name]  ZOX:[id number] DOB: 06-05-1945 DOA: 09/12/2022 PCP: Daisy Floro, MD   77/M w CAD/CABG, abdominal aortic aneurysm, hypertension, dyslipidemia presented to the ED with dyspneayo male with the past medical history of coronary artery disease, sp CABG, abdominal aortic aneurysm, hypertension, obesity and dyslipidemia who presented with dyspnea.  Noted to be volume overloaded initially 5/20, responding to diuretics 5/21: Echo noted mild reduction in LV function with new wall motion abnormalities 5/22 positive blood culture for MSSA, ID and orthopedics consulted.  -5/23 right knee arthrocentesis consistent with septic joint. 5/24 right knee I&D  -5/24 TEE Noted aortic valve vegetation with possible aortic root abscess formation with dissection flap -CTA chest overnight noted type A aortic dissection with intramural hematoma along the lateral aspect of ascending aorta and contained perforation and descending aorta at the level of pulmonary vein versus deep ulcer  Subjective: Somewhat confused, oriented to self and place only  Assessment and Plan:  Aortic valve abscess, endocarditis, MSSA bacteremia MSSA right knee septic arthritis, prosthetic joint infection  -Sepsis POA -Right knee aspirate with MSSA, orthopedics consulting, underwent I&D 5/24 -TEE 5/24 noted mobile vegetation on aortic valve with surrounding abscess and dissection flap and aortic root -CT chest done overnight concerning for type a aortic root dissection with large penetrating ulcer versus contained descending aortic rupture -CT surgery now consulting, recommended CT head and cath today -High risk of mortality -Continue IV nafcillin, infectious disease consulting  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  -Creatinine improving, 1.3 today  Peripheral arterial disease (HCC) -had aortobifemoral bypass in 2005. Has severe SFA disease bilaterally with ABIs in 0,87 range.  -On aspirin and statin  Hyperlipidemia Continue with ezetimibe and simvastatin   Obstructive sleep apnea C pap.   Class 1 obesity Calculated BMI is 31.6  Malnutrition of moderate degree Continue nutritional supplements.  Continue thiamine.   Acute metabolic encephalopathy -Delirium in the setting of endocarditis, bacteremia -Plan for CT head, MRI still pending  DVT prophylaxis: SCDs Code Status: Full code Family Communication: No family at bedside, attempted to reach daughter and son today without success Disposition Plan: To be determined  Consultants: Cards, T CTS, infectious disease   Procedures:   Antimicrobials:    Objective: Vitals:   09/19/22 0251 09/19/22 0253 09/19/22 0314 09/19/22 0341  BP: 131/71 (!) 147/80 126/64 (!) 141/73  Pulse:    63  Resp: 20 18 14 17   Temp:    97.7 F (36.5 C)  TempSrc:    Oral  SpO2: 98% 99% 100% 93%  Weight:    94.6 kg  Height:        Intake/Output Summary (Last 24 hours) at 09/19/2022 0551 Last data filed at 09/19/2022 0300 Gross per 24 hour  Intake 1207.73 ml  Output 545 ml  Net 662.73 ml   Filed Weights   09/17/22 0643 09/18/22 0318 09/19/22 0341  Weight: 90.7 kg 93.9 kg 94.6 kg    Examination:  Obese pleasant male sitting up in bed, AAO x 2, mild confusion HEENT: No JVD CVS: S1-S2, regular rhythm, systolic murmur Lungs:  Decreased breath sounds at the bases otherwise clear Abdomen: Soft, nontender, bowel sounds present Extremities: No edema    Data Reviewed:   CBC: Recent Labs  Lab 09/15/22 1543 09/16/22 0204 09/17/22 0312 09/18/22 0645 09/18/22 2143 09/19/22 0205  WBC 19.2* 18.7* 17.8* 18.6* 19.2* 16.9*  NEUTROABS 16.9* 15.9* 15.0* 16.3*  --  15.3*  HGB 10.6* 10.0* 10.3* 10.6* 10.4* 9.5*  HCT 32.2* 30.3* 29.8* 32.8* 31.5* 28.7*  MCV 100.3* 99.7 97.7 98.8 101.0* 98.3  PLT 377 364 420* 471* 439* 421*   Basic Metabolic Panel: Recent Labs  Lab 09/12/22 1311 09/13/22 0256 09/14/22 0353 09/15/22 0228 09/16/22 0204 09/17/22 0312 09/18/22 0645 09/18/22 2143 09/19/22 0205  NA 131* 131* 131* 133* 133* 134* 133*  --  136  K 3.2* 3.6 3.6 3.7 3.7 3.2* 3.6  --  4.2  CL 93* 94* 93* 93* 94* 97* 96*  --  101  CO2 24 26 25 25 27 22 22   --  24  GLUCOSE 143* 153* 118* 160* 142* 150* 152*  --  171*  BUN 24* 19 21 31* 44* 40* 33*  --  34*  CREATININE 1.29* 1.28* 1.52* 1.79* 1.90* 1.65* 1.43* 1.34* 1.33*  CALCIUM 8.2* 8.1* 8.4* 8.7* 8.7* 9.2 9.7  --  8.9  MG 1.7 1.6* 1.8  --   --   --   --   --   --   PHOS  --  2.7  --   --   --   --   --   --   --    GFR: Estimated Creatinine Clearance: 50.2 mL/min (A) (by C-G formula based on SCr of 1.33 mg/dL (H)). Liver Function Tests: Recent Labs  Lab 09/12/22 1311  AST 31  ALT 25  ALKPHOS 81  BILITOT 0.9  PROT 7.1  ALBUMIN 2.9*   No results for input(s): "LIPASE", "AMYLASE" in the last 168 hours. No results for input(s): "AMMONIA" in the last 168 hours. Coagulation Profile: No results for input(s): "INR", "PROTIME" in the last 168 hours. Cardiac Enzymes: No results for input(s): "CKTOTAL", "CKMB", "CKMBINDEX", "TROPONINI" in the last 168 hours. BNP (last 3 results) No results for input(s): "PROBNP" in the last 8760 hours. HbA1C: No results for input(s): "HGBA1C" in the last 72 hours. CBG: Recent Labs  Lab 09/17/22 2104 09/18/22 0742  09/18/22 1135 09/18/22 1518 09/18/22 2106  GLUCAP 154* 159* 114* 143* 171*   Lipid Profile: Recent Labs    09/16/22 0646  CHOL 51  HDL 14*  LDLCALC 21  TRIG 78  CHOLHDL 3.6   Thyroid Function Tests: No results for input(s): "TSH", "T4TOTAL", "FREET4", "T3FREE", "THYROIDAB" in the last 72 hours. Anemia Panel: No results for input(s): "VITAMINB12", "FOLATE", "FERRITIN", "TIBC", "IRON", "RETICCTPCT" in the last 72 hours. Urine analysis:    Component Value Date/Time   COLORURINE YELLOW 09/13/2022 0110   APPEARANCEUR CLEAR 09/13/2022 0110   LABSPEC 1.014 09/13/2022 0110   PHURINE 5.0 09/13/2022 0110   GLUCOSEU NEGATIVE 09/13/2022 0110   HGBUR NEGATIVE 09/13/2022 0110   BILIRUBINUR NEGATIVE 09/13/2022 0110   KETONESUR 5 (A) 09/13/2022 0110   PROTEINUR 30 (A) 09/13/2022 0110   UROBILINOGEN 0.2 09/26/2011 2244   NITRITE NEGATIVE 09/13/2022 0110   LEUKOCYTESUR NEGATIVE 09/13/2022 0110   Sepsis Labs: @LABRCNTIP (procalcitonin:4,lacticidven:4)  ) Recent Results (from the past 240 hour(s))  SARS Coronavirus 2  by RT PCR (hospital order, performed in [facility] hospital lab) *cepheid single result test* Anterior Nasal Swab     Status: None   Collection Time: 09/12/22  2:14 PM   Specimen: Anterior Nasal Swab  Result Value Ref Range Status   SARS Coronavirus 2 by RT PCR NEGATIVE NEGATIVE Final    Comment: (NOTE) SARS-CoV-2 target nucleic acids are NOT DETECTED.  The SARS-CoV-2 RNA is generally detectable in upper and lower respiratory specimens during the acute phase of infection. The lowest concentration of SARS-CoV-2 viral copies this assay can detect is 250 copies / mL. A negative result does not preclude SARS-CoV-2 infection and should not be used as the sole basis for treatment or other patient management decisions.  A negative result may occur with improper specimen collection / handling, submission of specimen other than nasopharyngeal swab, presence of viral  mutation(s) within the areas targeted by this assay, and inadequate number of viral copies (<250 copies / mL). A negative result must be combined with clinical observations, patient history, and epidemiological information.  Fact Sheet for Patients:   RoadLapTop.co.za  Fact Sheet for Healthcare Providers: http://kim-miller.com/  This test is not yet approved or  cleared by the [place] FDA and has been authorized for detection and/or diagnosis of SARS-CoV-2 by FDA under an Emergency Use Authorization (EUA).  This EUA will remain in effect (meaning this test can be used) for the duration of the COVID-19 declaration under Section 564(b)(1) of the Act, 21 U.S.C. section 360bbb-3(b)(1), unless the authorization is terminated or revoked sooner.  Performed at [facility], [address] [place], [place] [address]   MRSA Next Gen by PCR, Nasal     Status: None   Collection Time: 09/13/22  5:15 AM   Specimen: Nasal Mucosa; Nasal Swab  Result Value Ref Range Status   MRSA by PCR Next Gen NOT DETECTED NOT DETECTED Final    Comment: (NOTE) The GeneXpert MRSA Assay (FDA approved for NASAL specimens only), is one component of a comprehensive MRSA colonization surveillance program. It is not intended to diagnose MRSA infection nor to guide or monitor treatment for MRSA infections. Test performance is not FDA approved in patients less than 66 years old. Performed at [facility], [address] [place], [place] [address]   Culture, blood (Routine X 2) w Reflex to ID Panel     Status: Abnormal   Collection Time: 09/15/22  5:55 PM   Specimen: BLOOD RIGHT ARM  Result Value Ref Range Status   Specimen Description BLOOD RIGHT ARM  Final   Special Requests   Final    BOTTLES DRAWN AEROBIC AND ANAEROBIC Blood Culture adequate volume   Culture  Setup Time   Final    GRAM POSITIVE COCCI IN CLUSTERS IN BOTH AEROBIC AND ANAEROBIC  BOTTLES CRITICAL VALUE NOTED.  VALUE IS CONSISTENT WITH PREVIOUSLY REPORTED AND CALLED VALUE.    Culture (A)  Final    STAPHYLOCOCCUS AUREUS SUSCEPTIBILITIES PERFORMED ON PREVIOUS CULTURE WITHIN THE LAST 5 DAYS. Performed at [facility], [address] [place], [place] [address]    Report Status 09/18/2022 FINAL  Final  Culture, blood (Routine X 2) w Reflex to ID Panel     Status: Abnormal   Collection Time: 09/15/22  5:55 PM   Specimen: BLOOD RIGHT ARM  Result Value Ref Range Status   Specimen Description BLOOD RIGHT ARM  Final   Special Requests   Final  BOTTLES DRAWN AEROBIC AND ANAEROBIC Blood Culture adequate volume   Culture  Setup Time   Final    GRAM POSITIVE COCCI IN CLUSTERS IN BOTH AEROBIC AND ANAEROBIC BOTTLES CRITICAL RESULT CALLED TO, READ BACK BY AND VERIFIED WITH: A. PAYTES PHARMD, AT 4098 09/16/22 Renato Shin Performed at [facility], [address] [place], [place] [address]    Culture STAPHYLOCOCCUS AUREUS (A)  Final   Report Status 09/18/2022 FINAL  Final   Organism ID, Bacteria STAPHYLOCOCCUS AUREUS  Final      Susceptibility   Staphylococcus aureus - MIC*    CIPROFLOXACIN <=0.5 SENSITIVE Sensitive     ERYTHROMYCIN <=0.25 SENSITIVE Sensitive     GENTAMICIN <=0.5 SENSITIVE Sensitive     OXACILLIN 0.5 SENSITIVE Sensitive     TETRACYCLINE <=1 SENSITIVE Sensitive     VANCOMYCIN <=0.5 SENSITIVE Sensitive     TRIMETH/SULFA <=10 SENSITIVE Sensitive     CLINDAMYCIN <=0.25 SENSITIVE Sensitive     RIFAMPIN <=0.5 SENSITIVE Sensitive     Inducible Clindamycin NEGATIVE Sensitive     LINEZOLID 2 SENSITIVE Sensitive     * STAPHYLOCOCCUS AUREUS  Blood Culture ID Panel (Reflexed)     Status: Abnormal   Collection Time: 09/15/22  5:55 PM  Result Value Ref Range Status   Enterococcus faecalis NOT DETECTED NOT DETECTED Final   Enterococcus Faecium NOT DETECTED NOT DETECTED Final   Listeria monocytogenes NOT DETECTED NOT DETECTED Final   Staphylococcus  species DETECTED (A) NOT DETECTED Final    Comment: CRITICAL RESULT CALLED TO, READ BACK BY AND VERIFIED WITH: A. PAYTES PHARMD, AT 7829 09/16/22 D. VANHOOK    Staphylococcus aureus (BCID) DETECTED (A) NOT DETECTED Final    Comment: CRITICAL RESULT CALLED TO, READ BACK BY AND VERIFIED WITH: A. PAYTES PHARMD, AT 5621 09/16/22 D. VANHOOK    Staphylococcus epidermidis NOT DETECTED NOT DETECTED Final   Staphylococcus lugdunensis NOT DETECTED NOT DETECTED Final   Streptococcus species NOT DETECTED NOT DETECTED Final   Streptococcus agalactiae NOT DETECTED NOT DETECTED Final   Streptococcus pneumoniae NOT DETECTED NOT DETECTED Final   Streptococcus pyogenes NOT DETECTED NOT DETECTED Final   A.calcoaceticus-baumannii NOT DETECTED NOT DETECTED Final   Bacteroides fragilis NOT DETECTED NOT DETECTED Final   Enterobacterales NOT DETECTED NOT DETECTED Final   Enterobacter cloacae complex NOT DETECTED NOT DETECTED Final   Escherichia coli NOT DETECTED NOT DETECTED Final   Klebsiella aerogenes NOT DETECTED NOT DETECTED Final   Klebsiella oxytoca NOT DETECTED NOT DETECTED Final   Klebsiella pneumoniae NOT DETECTED NOT DETECTED Final   Proteus species NOT DETECTED NOT DETECTED Final   Salmonella species NOT DETECTED NOT DETECTED Final   Serratia marcescens NOT DETECTED NOT DETECTED Final   Haemophilus influenzae NOT DETECTED NOT DETECTED Final   Neisseria meningitidis NOT DETECTED NOT DETECTED Final   Pseudomonas aeruginosa NOT DETECTED NOT DETECTED Final   Stenotrophomonas maltophilia NOT DETECTED NOT DETECTED Final   Candida albicans NOT DETECTED NOT DETECTED Final   Candida auris NOT DETECTED NOT DETECTED Final   Candida glabrata NOT DETECTED NOT DETECTED Final   Candida krusei NOT DETECTED NOT DETECTED Final   Candida parapsilosis NOT DETECTED NOT DETECTED Final   Candida tropicalis NOT DETECTED NOT DETECTED Final   Cryptococcus neoformans/gattii NOT DETECTED NOT DETECTED Final   Meth resistant  mecA/C and MREJ NOT DETECTED NOT DETECTED Final    Comment: Performed at [facility], [address] [place], [place] [address]  Body fluid culture w  Gram Stain     Status: None   Collection Time: 09/16/22  3:59 PM   Specimen: Synovium; Body Fluid  Result Value Ref Range Status   Specimen Description SYNOVIAL  Final   Special Requests RIGHT KNEE  Final   Gram Stain   Final    ABUNDANT WBC PRESENT, PREDOMINANTLY PMN FEW GRAM POSITIVE COCCI IN PAIRS CRITICAL RESULT CALLED TO, READ BACK BY AND VERIFIED WITH: RN PEYTON RIDGE ON 09/16/22 @ 1647 BY DRT Performed at [facility], [address] [place], [place] [address]    Culture ABUNDANT STAPHYLOCOCCUS AUREUS  Final   Report Status 09/18/2022 FINAL  Final   Organism ID, Bacteria STAPHYLOCOCCUS AUREUS  Final      Susceptibility   Staphylococcus aureus - MIC*    CIPROFLOXACIN <=0.5 SENSITIVE Sensitive     ERYTHROMYCIN <=0.25 SENSITIVE Sensitive     GENTAMICIN <=0.5 SENSITIVE Sensitive     OXACILLIN <=0.25 SENSITIVE Sensitive     TETRACYCLINE <=1 SENSITIVE Sensitive     VANCOMYCIN <=0.5 SENSITIVE Sensitive     TRIMETH/SULFA <=10 SENSITIVE Sensitive     CLINDAMYCIN <=0.25 SENSITIVE Sensitive     RIFAMPIN <=0.5 SENSITIVE Sensitive     Inducible Clindamycin NEGATIVE Sensitive     LINEZOLID 2 SENSITIVE Sensitive     * ABUNDANT STAPHYLOCOCCUS AUREUS     Radiology Studies: CT ANGIO CHEST AORTA W/CM & OR WO/CM  Result Date: 09/19/2022 CLINICAL DATA:  Aortic infection or inflammation. EXAM: CT ANGIOGRAPHY CHEST WITH CONTRAST TECHNIQUE: Multidetector CT imaging of the chest was performed using the standard protocol during bolus administration of intravenous contrast. Multiplanar CT image reconstructions and MIPs were obtained to evaluate the vascular anatomy. RADIATION DOSE REDUCTION: This exam was performed according to the departmental dose-optimization program which includes automated exposure control, adjustment of the mA and/or  kV according to patient size and/or use of iterative reconstruction technique. 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 No dissection propagation into the descending aorta or the arch vessels is seen. At the level of the left inferior pulmonary vein, at the contours of the descending aorta are markedly irregular and there is surrounding intermediate density material. This is concerning for a contained aortic rupture or large penetrating atheromatous ulcer. This appearance is new compared to 07/21/2022. Mediastinum/Nodes: No mediastinal, hilar or axillary lymphadenopathy. Normal visualized thyroid. Thoracic esophageal course is normal. Lungs/Pleura: There are bilateral pleural effusions. Upper Abdomen: Contrast bolus timing is not optimized for evaluation of the abdominal organs. The visualized portions of the organs of the upper abdomen are normal. Musculoskeletal: No chest wall abnormality. No bony spinal canal stenosis. Review of the MIP images confirms the above findings. IMPRESSION: 1. 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 2. 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 Vascular surgery consultation recommended. 3. Bilateral small pleural effusions. Critical Value/emergent results were called by telephone at the time of interpretation on 09/19/2022 at 2:08 am to Dr. Imogene Burn, who verbally acknowledged these results. Electronically Signed   By: Deatra Robinson M.D.   On: 09/19/2022 02:09   EP STUDY  Result Date: 09/18/2022 See surgical note for result.  ECHO TEE  Result Date: 09/18/2022    TRANSESOPHOGEAL ECHO REPORT   Patient  Name:   MARYAN MCFEATERS Lembcke Date of Exam: 09/18/2022 Medical Rec  #:  [id number]       Height:       67.0 in Accession #:    [id number]      Weight:       207.0 lb Date of Birth:  December 07, 1945       BSA:          2.052 m Patient Age:    21 years        BP:           134/76 mmHg Patient Gender: M               HR:           85 bpm. Exam Location:  Inpatient Procedure: Transesophageal Echo, Cardiac Doppler and Color Doppler Indications:     CHF  History:         Patient has prior history of Echocardiogram examinations, most                  recent 09/16/2022. Aortic Valve Disease; Risk                  Factors:Hypertension.  Sonographer:     Dondra Prader RVT Referring Phys:  [id number] Lillia Abed B ROBERTS Diagnosing Phys: Arvilla Meres MD PROCEDURE: The transesophogeal probe was passed without difficulty through the esophogus of the patient. Sedation performed by different physician. The patient developed no complications during the procedure.  IMPRESSIONS  1. Left ventricular ejection fraction, by estimation, is 55 to 60%. The left ventricle has normal function. The left ventricle demonstrates regional wall motion abnormalities (see scoring diagram/findings for description).  2. Right ventricular systolic function is mildly reduced. The right ventricular size is normal.  3. LAA not visulaized. No left atrial/left atrial appendage thrombus was detected.  4. No mitral valve vegetation. The mitral valve is normal in structure. Mild mitral valve regurgitation.  5. No tricuspid valve vegetation.  6. There is a small mobile vegetation on the aortic side of the valve with what appears to be surrounding abscess formation and dissection flap in the aortic root. The aortic valve is tricuspid. There is mild calcification of the aortic valve. Aortic valve regurgitation is mild. No aortic stenosis is present.  7. No pulmonic valve vegetation.  8. There is edema in the tissues surrounding the aortic valve as well as a dissection flap in the aortic root with what appears to be abscess formation or a false  lumen. Given clinical picture I have high suspicion for abscess formation. Recommend CT imaging for further evaluation. D/w ID team. Aortic dilatation noted. There is mild dilatation of the aortic root, measuring 41 mm. FINDINGS  Left Ventricle: Left ventricular ejection fraction, by estimation, is 55 to 60%. The left ventricle has normal function. The left ventricle demonstrates regional wall motion abnormalities. The left ventricular internal cavity size was normal in size.  LV Wall Scoring: The mid inferoseptal segment and apical septal segment are hypokinetic. Right Ventricle: The right ventricular size is normal. No increase in right ventricular wall thickness. Right ventricular systolic function is mildly reduced. Left Atrium: LAA not visulaized. Left atrial size was normal in size. No left atrial/left atrial appendage thrombus was detected. Right Atrium: Right atrial size was normal in size. Pericardium: There is no evidence of pericardial effusion. Mitral Valve: No mitral valve vegetation. The mitral valve is normal in structure. Mild mitral valve regurgitation. Tricuspid Valve: No tricuspid valve vegetation.  The tricuspid valve is normal in structure. Tricuspid valve regurgitation is trivial. Aortic Valve: There is a small mobile vegetation on the aortic side of the valve with what appears to be surrounding abscess formation and dissection flap in the aortic root. The aortic valve is tricuspid. There is mild calcification of the aortic valve.  Aortic valve regurgitation is mild. No aortic stenosis is present. Pulmonic Valve: No pulmonic valve vegetation. The pulmonic valve was normal in structure. Pulmonic valve regurgitation is trivial. Aorta: There is edema in the tissues surrounding the aortic valve as well as a dissection flap in the aortic root with what appears to be abscess formation or a false lumen. Given clinical picture I have high suspicion for abscess formation. Recommend CT  imaging for  further evaluation. D/w ID team. Aortic dilatation noted. There is mild dilatation of the aortic root, measuring 41 mm. IAS/Shunts: The interatrial septum appears to be lipomatous. No atrial level shunt detected by color flow Doppler. Arvilla Meres MD Electronically signed by Arvilla Meres MD Signature Date/Time: 09/18/2022/5:51:51 PM    Final (Updated)      Scheduled Meds:  acetaminophen  1,000 mg Oral Q6H   aspirin EC  81 mg Oral Daily   diclofenac Sodium  4 g Topical QID   docusate sodium  100 mg Oral BID   ezetimibe  10 mg Oral Daily   famotidine  40 mg Oral Daily   feeding supplement  237 mL Oral TID BM   insulin aspart  0-5 Units Subcutaneous QHS   insulin aspart  0-9 Units Subcutaneous TID WC   metoprolol tartrate  5 mg Intravenous Q15 min   metoprolol tartrate  12.5 mg Oral BID   multivitamin with minerals  1 tablet Oral Daily   simvastatin  40 mg Oral Daily   Continuous Infusions:  sodium chloride Stopped (09/18/22 1333)   sodium chloride 75 mL/hr at 09/19/22 0300   methocarbamol (ROBAXIN) IV     nafcillin 12 g in sodium chloride 0.9 % 500 mL continuous infusion Stopped (09/18/22 1302)   thiamine (VITAMIN B1) injection Stopped (09/17/22 1913)     LOS: 6 days    Time spent:    Zannie Cove, MD [facility]   09/19/2022, 5:51 AM

## 2022-09-19 NOTE — Progress Notes (Signed)
PT Cancellation Note  Patient Details Name: Johnny [name] MRN: [id number] DOB: 11/21/1945   Cancelled Treatment:    Reason Eval/Treat Not Completed: Medical issues which prohibited therapy. Pt with type A aortic dissection and pt likely to go to cath lab later today. Will monitor for medical appropriateness for PT.   Angelina Ok [facility] 09/19/2022, 9:22 AM Skip Mayer PT Acute [employer] [phone]

## 2022-09-19 NOTE — Progress Notes (Addendum)
  X-cover Note: Spoke with Dr. Wilmot(cardiology) who recommended I speak with cardiothoracic surgery.  Spoke with Dr. Leafy Ro and discussed yesterday's TEE findings and CTA report findings. He states he will look at imaging.  Pt has been given 5 mg IV lopressor. Pt is NPO  Carollee Herter, DO [facility]

## 2022-09-19 NOTE — Progress Notes (Addendum)
Rounding Note    Patient Name: Johnny [name] Date of Encounter: 09/19/2022  [facility] Cardiologist: Nanetta Batty, MD   Subjective   He is alert and conversant and coherent, but clearly confused about the last few days and current circumstances.  He believes that his wife is preparing to have surgery (which is not the case) and has to be reminded that he just had surgery himself.  (Was able to get in touch with his son, Johnny [name] who confirms that the patient's wife does not plan to have any surgery.  For years, Johnny [name] has been the primary caregiver for his wife who suffers with Alzheimer's dementia).  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  There is a relatively small mobile vegetation on the aortic valve, which thankfully has no significant hemodynamic abnormalities (no evidence of stenosis, mild aortic insufficiency).  Had CABG in 1992 , LIMA to LAD, SVG-diagonal, sequential SVG-PDA and PLA.  Review of his current CT appears to show patent LIMA to the LAD and 2 radiopaque markers on the right lateral surface of the ascending aorta that are connected to what appear to be occluded grafts to the right coronary artery and probably an oblique marginal artery.    His most recent nuclear stress test shows an extensive inferolateral scar and a fixed anteroseptal defect with EF 52%.  Most recent transthoracic echo shows LVEF 45-50% and "global hypokinesis", but subsequent TEE shows EF 55-60% with regional wall motion abnormalities.  Inpatient Medications    Scheduled Meds:  acetaminophen  1,000 mg Oral Q6H   amLODipine  5 mg Oral Daily   aspirin EC  81 mg Oral Daily   diclofenac Sodium  4 g Topical QID    docusate sodium  100 mg Oral BID   ezetimibe  10 mg Oral Daily   famotidine  40 mg Oral Daily   feeding supplement  237 mL Oral TID BM   insulin aspart  0-5 Units Subcutaneous QHS   insulin aspart  0-9 Units Subcutaneous TID WC   metoprolol tartrate  25 mg Oral BID   multivitamin with minerals  1 tablet Oral Daily   simvastatin  40 mg Oral Daily   Continuous Infusions:  sodium chloride Stopped (09/18/22 1333)   sodium chloride 75 mL/hr at 09/19/22 0700   methocarbamol (ROBAXIN) IV     nafcillin 12 g in sodium chloride 0.9 % 500 mL continuous infusion Stopped (09/18/22 1302)   thiamine (VITAMIN B1) injection Stopped (09/17/22 1913)   PRN Meds: acetaminophen, alum & mag hydroxide-simeth, bisacodyl, diphenhydrAMINE, haloperidol **OR** haloperidol lactate, melatonin, menthol-cetylpyridinium **OR** phenol, methocarbamol **OR** methocarbamol (ROBAXIN) IV, metoCLOPramide **OR** metoCLOPramide (REGLAN) injection, morphine injection, ondansetron **OR** ondansetron (ZOFRAN) IV, oxyCODONE, polyethylene glycol, sodium phosphate, traMADol   Vital Signs    Vitals:   09/19/22 0610 09/19/22 0620 09/19/22 0630 09/19/22 0712  BP: 135/72   135/62  Pulse:      Resp: 20 15 (!) 24 15  Temp:      TempSrc:      SpO2: 95% 96% 94% 95%  Weight:      Height:        Intake/Output Summary (Last 24 hours) at 09/19/2022 0806 Last data filed at 09/19/2022 0700  Gross per 24 hour  Intake 1506.08 ml  Output 545 ml  Net 961.08 ml      09/19/2022    3:41 AM 09/18/2022    3:18 AM 09/17/2022    6:43 AM  Last 3 Weights  Weight (lbs) 208 lb 9.6 oz 207 lb 0.2 oz 200 lb  Weight (kg) 94.62 kg 93.9 kg 90.719 kg      Telemetry    NSR - Personally Reviewed  ECG    Normal sinus rhythm, small Q waves V1-V3, no acute ischemic repolarization abnormalities.- Personally Reviewed  Physical Exam  Alert, appears comfortable.  Mildly disoriented. GEN: No acute distress.   Neck: No JVD Cardiac: RRR, no murmurs, rubs,  or gallops.  Respiratory: Clear to auscultation bilaterally. GI: Soft, nontender, non-distended  MS: No edema; No deformity. Neuro:  Nonfocal  Psych: Normal affect   Labs    High Sensitivity Troponin:   Recent Labs  Lab 09/12/22 1311 09/12/22 1700  TROPONINIHS 183* 150*     Chemistry Recent Labs  Lab 09/12/22 1311 09/13/22 0256 09/14/22 0353 09/15/22 0228 09/17/22 0312 09/18/22 0645 09/18/22 2143 09/19/22 0205  NA 131* 131* 131*   < > 134* 133*  --  136  K 3.2* 3.6 3.6   < > 3.2* 3.6  --  4.2  CL 93* 94* 93*   < > 97* 96*  --  101  CO2 24 26 25    < > 22 22  --  24  GLUCOSE 143* 153* 118*   < > 150* 152*  --  171*  BUN 24* 19 21   < > 40* 33*  --  34*  CREATININE 1.29* 1.28* 1.52*   < > 1.65* 1.43* 1.34* 1.33*  CALCIUM 8.2* 8.1* 8.4*   < > 9.2 9.7  --  8.9  MG 1.7 1.6* 1.8  --   --   --   --   --   PROT 7.1  --   --   --   --   --   --   --   ALBUMIN 2.9*  --   --   --   --   --   --   --   AST 31  --   --   --   --   --   --   --   ALT 25  --   --   --   --   --   --   --   ALKPHOS 81  --   --   --   --   --   --   --   BILITOT 0.9  --   --   --   --   --   --   --   GFRNONAA 57* 57* 47*   < > 42* 50* 54* 55*  ANIONGAP 14 11 13    < > 15 15  --  11   < > = values in this interval not displayed.    Lipids  Recent Labs  Lab 09/16/22 0646  CHOL 51  TRIG 78  HDL 14*  LDLCALC 21  CHOLHDL 3.6    Hematology Recent Labs  Lab 09/18/22 0645 09/18/22 2143 09/19/22 0205  WBC 18.6* 19.2* 16.9*  RBC 3.32* 3.12* 2.92*  HGB 10.6* 10.4* 9.5*  HCT 32.8* 31.5* 28.7*  MCV 98.8 101.0* 98.3  MCH 31.9 33.3 32.5  MCHC 32.3 33.0 33.1  RDW 13.4 13.6 13.7  PLT 471* 439*  421*   Thyroid No results for input(s): "TSH", "FREET4" in the last 168 hours.  BNP Recent Labs  Lab 09/12/22 1311  BNP 131.6*    DDimer No results for input(s): "DDIMER" in the last 168 hours.   Radiology    CT ANGIO CHEST AORTA W/CM & OR WO/CM  Result Date: 09/19/2022 CLINICAL DATA:  Aortic  infection or inflammation. EXAM: CT ANGIOGRAPHY CHEST WITH CONTRAST TECHNIQUE: Multidetector CT imaging of the chest was performed using the standard protocol during bolus administration of intravenous contrast. Multiplanar CT image reconstructions and MIPs were obtained to evaluate the vascular anatomy. RADIATION DOSE REDUCTION: This exam was performed according to the departmental dose-optimization program which includes automated exposure control, adjustment of the mA and/or kV according to patient size and/or use of iterative reconstruction technique. 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 No dissection propagation into the descending aorta or the arch vessels is seen. At the level of the left inferior pulmonary vein, at the contours of the descending aorta are markedly irregular and there is surrounding intermediate density material. This is concerning for a contained aortic rupture or large penetrating atheromatous ulcer. This appearance is new compared to 07/21/2022. Mediastinum/Nodes: No mediastinal, hilar or axillary lymphadenopathy. Normal visualized thyroid. Thoracic esophageal course is normal. Lungs/Pleura: There are bilateral pleural effusions. Upper Abdomen: Contrast bolus timing is not optimized for evaluation of the abdominal organs. The visualized portions of the organs of the upper abdomen are normal. Musculoskeletal: No chest wall abnormality. No bony spinal canal stenosis. Review of the MIP images confirms the above findings. IMPRESSION: 1. 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 2. 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 Vascular surgery consultation recommended.  3. Bilateral small pleural effusions. Critical Value/emergent results were called by telephone at the time of interpretation on 09/19/2022 at 2:08 am to Dr. Imogene Burn, who verbally acknowledged these results. Electronically Signed   By: Deatra Robinson M.D.   On: 09/19/2022 02:09   EP STUDY  Result Date: 09/18/2022 See surgical note for result.  ECHO TEE  Result Date: 09/18/2022    TRANSESOPHOGEAL ECHO REPORT   Patient Name:   MADDOCK BROSNAHAN Date of Exam: 09/18/2022 Medical Rec #:  [id number]       Height:       67.0 in Accession #:    [id number]      Weight:       207.0 lb Date of Birth:  July 12, 1945       BSA:          2.052 m Patient Age:    56 years        BP:           134/76 mmHg Patient Gender: M               HR:           85 bpm. Exam Location:  Inpatient Procedure: Transesophageal Echo, Cardiac Doppler and Color Doppler Indications:     CHF  History:         Patient has prior history of Echocardiogram examinations, most                  recent 09/16/2022. Aortic Valve Disease;  Risk                  Factors:Hypertension.  Sonographer:     Dondra Prader RVT Referring Phys:  [id number] Lillia Abed B ROBERTS Diagnosing Phys: Arvilla Meres MD PROCEDURE: The transesophogeal probe was passed without difficulty through the esophogus of the patient. Sedation performed by different physician. The patient developed no complications during the procedure.  IMPRESSIONS  1. Left ventricular ejection fraction, by estimation, is 55 to 60%. The left ventricle has normal function. The left ventricle demonstrates regional wall motion abnormalities (see scoring diagram/findings for description).  2. Right ventricular systolic function is mildly reduced. The right ventricular size is normal.  3. LAA not visulaized. No left atrial/left atrial appendage thrombus was detected.  4. No mitral valve vegetation. The mitral valve is normal in structure. Mild mitral valve regurgitation.  5. No tricuspid valve vegetation.  6. There is a small  mobile vegetation on the aortic side of the valve with what appears to be surrounding abscess formation and dissection flap in the aortic root. The aortic valve is tricuspid. There is mild calcification of the aortic valve. Aortic valve regurgitation is mild. No aortic stenosis is present.  7. No pulmonic valve vegetation.  8. There is edema in the tissues surrounding the aortic valve as well as a dissection flap in the aortic root with what appears to be abscess formation or a false lumen. Given clinical picture I have high suspicion for abscess formation. Recommend CT imaging for further evaluation. D/w ID team. Aortic dilatation noted. There is mild dilatation of the aortic root, measuring 41 mm. FINDINGS  Left Ventricle: Left ventricular ejection fraction, by estimation, is 55 to 60%. The left ventricle has normal function. The left ventricle demonstrates regional wall motion abnormalities. The left ventricular internal cavity size was normal in size.  LV Wall Scoring: The mid inferoseptal segment and apical septal segment are hypokinetic. Right Ventricle: The right ventricular size is normal. No increase in right ventricular wall thickness. Right ventricular systolic function is mildly reduced. Left Atrium: LAA not visulaized. Left atrial size was normal in size. No left atrial/left atrial appendage thrombus was detected. Right Atrium: Right atrial size was normal in size. Pericardium: There is no evidence of pericardial effusion. Mitral Valve: No mitral valve vegetation. The mitral valve is normal in structure. Mild mitral valve regurgitation. Tricuspid Valve: No tricuspid valve vegetation. The tricuspid valve is normal in structure. Tricuspid valve regurgitation is trivial. Aortic Valve: There is a small mobile vegetation on the aortic side of the valve with what appears to be surrounding abscess formation and dissection flap in the aortic root. The aortic valve is tricuspid. There is mild calcification of  the aortic valve.  Aortic valve regurgitation is mild. No aortic stenosis is present. Pulmonic Valve: No pulmonic valve vegetation. The pulmonic valve was normal in structure. Pulmonic valve regurgitation is trivial. Aorta: There is edema in the tissues surrounding the aortic valve as well as a dissection flap in the aortic root with what appears to be abscess formation or a false lumen. Given clinical picture I have high suspicion for abscess formation. Recommend CT  imaging for further evaluation. D/w ID team. Aortic dilatation noted. There is mild dilatation of the aortic root, measuring 41 mm. IAS/Shunts: The interatrial septum appears to be lipomatous. No atrial level shunt detected by color flow Doppler. Arvilla Meres MD Electronically signed by Arvilla Meres MD Signature Date/Time: 09/18/2022/5:51:51 PM    Final (Updated)  Cardiac Studies   See transthoracic and transesophageal results above.  Patient Profile     77 y.o. 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  Assessment & Plan    Extremely difficult situation. 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 Evaluated by Dr. Leafy Ro who would like to know the status of his  coronary circulation before contemplating what would be a high risk repair of the ascending aorta. Information from the CT angiogram suggest that he has a patent LIMA to LAD.  This was not a targeted/gated study, but 2 radiopaque markers are seen in the ascending aorta that are connected to what appear to be calcified SVG conduits without any evidence of arterial contrast.  It is very likely that the SVG conduits which are more than 79 years old are now occluded.  Heavy calcification seen in the native coronary arteries, but the data is insufficient to decide whether these are patent. Thankfully he appears to be hemodynamically well compensated without any evidence of active ischemia, congestive heart failure, low cardiac output or shock.  In normal sinus rhythm.  The renal function has been steadily improving and is back to the patient's baseline around 1.3-1.4.  CRITICAL CARE Performed by: Rachelle Hora Jalene Lacko   Total critical care time: 45 minutes  Critical care time was exclusive of separately billable procedures and treating other patients.  Critical care was necessary to treat or prevent imminent or life-threatening deterioration.  Critical care was time spent personally by me on the following activities: development of treatment plan with patient and/or surrogate as well as nursing, discussions with consultants, evaluation of patient's response to treatment, examination of patient, obtaining history from patient or surrogate, ordering and performing treatments and interventions, ordering and review of laboratory studies, ordering and review of radiographic studies, pulse oximetry and re-evaluation of patient's condition.  Discussed at length with Dr. Bryan Lemma and Dr. Leafy Ro. Reviewed the catheterization procedure with the patient who is agreeable to go ahead with it, but is clearly still a little disoriented.  Therefore also discussed all the risk/benefits/possible complications with his son  Denyse Amass obtained phone consent.  Consent witnessed by nursing staff.  His son will be coming into the hospital and hopefully will arrive before the procedure begins.     For questions or updates, please contact  [facility] Please consult [email or web address] for contact info under        Signed, Thurmon Fair, MD  09/19/2022, 8:06 AM

## 2022-09-19 NOTE — Progress Notes (Signed)
Addendum: Discussed with pts son at bedside, high mortality risk and no plan for surgery after multi-disciplinary discussions this morning. -remains at high risk of Aortic rupture and death -son agrees to DNR which I recommended -will consult Palliative care  Zannie Cove, MD

## 2022-09-19 NOTE — Progress Notes (Signed)
   Palliative Medicine Inpatient Follow Up Note   The Palliative Care Team has acknowledged the consultation for Johnny [name].  I have called and spoken with patients son, Johnny [name] who shares he will be able to come in tomorrow ay 16:30 for a goals of care conversation.  Initial consult note to follow.  No Charge. ______________________________________________________________________________________ Johnny [name] Palliative Medicine Team Team Cell Phone: [phone] Please utilize secure chat with additional questions, if there is no response within 30 minutes please call the above phone number  Palliative Medicine Team providers are available by phone from 7am to 7pm daily and can be reached through the team cell phone.  Should this patient require assistance outside of these hours, please call the patient's attending physician.

## 2022-09-19 NOTE — Progress Notes (Addendum)
  X-cover Note: Received call from radiologist regarding pt's CTA that was ordered 6:22 pm.  Apparently pt had TEE performed on 09-18-2022 due to MSSA growing from right knee septic joint.  TEE showed aortic valve vegetation and possible abscess with dissection flap in aortic root.  Pt with type A aortic dissection with intramural hematoma along right lateral aspect of ascending aorta.  Also contained perforation in descending aorta at level of pulmonary vein vs deep ulcer.  Have paged cardiologist on-call(Wilmot) to discuss next steps.  Carollee Herter, DO [facility]

## 2022-09-19 NOTE — Progress Notes (Signed)
   Subjective: 1 Day Post-Op Procedure(s) (LRB): IRRIGATION AND DEBRIDEMENT KNEE WITH POLY EXCHANGE (Right) TRANSESOPHAGEAL ECHOCARDIOGRAM (N/A)  Pt has MSSA bactermia with a root abscess seen on TEE  1 day after right knee I&D Pt currently disoriented  Plan for cardiac cath later today Patient reports pain as mild.  Objective:   VITALS:   Vitals:   09/19/22 0832 09/19/22 0833  BP: 132/75   Pulse:  70  Resp:    Temp:    SpO2:      Right knee: dressing in placed Guarded rom due to pain Nv intact distally No rashes with slight edema distally  LABS Recent Labs    09/18/22 0645 09/18/22 2143 09/19/22 0205  HGB 10.6* 10.4* 9.5*  HCT 32.8* 31.5* 28.7*  WBC 18.6* 19.2* 16.9*  PLT 471* 439* 421*    Recent Labs    09/17/22 0312 09/18/22 0645 09/18/22 2143 09/19/22 0205  NA 134* 133*  --  136  K 3.2* 3.6  --  4.2  BUN 40* 33*  --  34*  CREATININE 1.65* 1.43* 1.34* 1.33*  GLUCOSE 150* 152*  --  171*     Assessment/Plan: 1 Day Post-Op Procedure(s) (LRB): IRRIGATION AND DEBRIDEMENT KNEE WITH POLY EXCHANGE (Right) TRANSESOPHAGEAL ECHOCARDIOGRAM (N/A) Spoke with CVTS and plan for cardiac cath today Pt has an aortic root abscess with dissection  Knee is low priority at this point given MSSA infection Will continue to monitor his progress after cath today    Alphonsa Overall PA-C, [facility] is now [facility] [employer] [facility] [address], [place], [place] [address] Phone: [phone] [email or web address] Facebook  [employer]

## 2022-09-19 NOTE — Progress Notes (Signed)
  X-cover Note: Spoke with Dr. Leafy Ro with cardiothoracic surgery. He has reviewed CTA images. Keep pt NPO. Dr. Leafy Ro wants pt to have LHC this morning. Will page cardiologist on-call(Wilmot) and have him arrange for this.  Target SBP 100-120 per Dr. Leafy Ro.  Carollee Herter, DO [facility]

## 2022-09-19 NOTE — Progress Notes (Signed)
     [address]       [place] [address]             [phone]        I have reviewed the CTA and TEE with cardiology and AHF team and its the consensus that this patient would not survive an attempted repair of ruptured abscess/dissection of his aortic root with the combined contained rupture of the descending aorta on top of systemic infection in knee and probable showering of his brain with recent encephalopathy. I have informed family and primary covering team.

## 2022-09-19 NOTE — Consult Note (Signed)
[address]       [place] [address]             [phone]           Johnny [name] [facility] Medical Record #[id number] Date of Birth: 10/20/1945  No ref. provider found Johnny Floro, MD  Chief Complaint:    Chief Complaint  Patient presents with   Foot Swelling   Abdominal Pain    History of Present Illness:     77 yo male admitted with CHF exacerbation and noted to be febrile. Work up revealed MSSA bacteremia and with knee pain was aspirated and found to have septic joint. Pt also at this time became disoriented and unable to be oriented. TTE performed and without evidence of endocarditis and with slightly depressed LV function. While intra-op for septic joint wash out underwent TEE and with evidence of root abscess vs dissection flap. Pt was ordered for a CTA of chest and performed early this am. 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 Pt currently hemodynamically stable however disoriented and rambles speech. Have reached out to son and daughter but went to voice mail. Pt sp CABG in about 2012.       Past Medical History:  Diagnosis Date   Abdominal aortic aneurysm (HCC)    Cardiac murmur    Coronary artery disease    Heart attack (HCC)    Hemorrhoids    Occasional bleeding    Hiatal hernia    History of myocardial infarction 1992   History of rheumatic fever    Hyperlipidemia    Hypertension    Obstructive sleep apnea    Osteoarthritis of knee    Right knee   Peripheral arterial disease (HCC)    status post aortobifemoral bypass grafting by Dr. Hart Rochester in 2005   Sleep apnea     Past Surgical History:  Procedure Laterality Date   ABDOMINAL AORTIC ANEURYSM REPAIR  2005   CARDIAC CATHETERIZATION  1992   followed by open heart coronary artery bypass grafting of 4 vessels post MI   CHOLECYSTECTOMY     CORONARY ARTERY BYPASS GRAFT     REPLACEMENT TOTAL KNEE     Right knee  cartilage surgery in 1976    Social History   Tobacco Use  Smoking Status Former   Types: Cigarettes   Quit date: 08/26/1990   Years since quitting: 32.0   Passive exposure: Never  Smokeless Tobacco Never    Social History   Substance and Sexual Activity  Alcohol Use Yes   Comment: occassional     Social History   Socioeconomic History   Marital status: Married    Spouse name: Not on file   Number of children: Not on file   Years of education: Not on file   Highest education level: Not on file  Occupational History   Not on file  Tobacco Use   Smoking status: Former    Types: Cigarettes    Quit date: 08/26/1990    Years since quitting: 32.0    Passive exposure: Never   Smokeless tobacco: Never  Vaping Use   Vaping Use: Never used  Substance and Sexual Activity   Alcohol use: Yes    Comment: occassional    Drug use: No   Sexual activity: Not Currently  Other Topics Concern   Not on file  Social History Narrative   Not on file  Social Determinants of Health   Financial Resource Strain: Not on file  Food Insecurity: No Food Insecurity (09/12/2022)   Hunger Vital Sign    Worried About Running Out of Food in the Last Year: Never true    Ran Out of Food in the Last Year: Never true  Transportation Needs: No Transportation Needs (09/12/2022)   PRAPARE - Administrator, Civil Service (Medical): No    Lack of Transportation (Non-Medical): No  Physical Activity: Not on file  Stress: Not on file  Social Connections: Not on file  Intimate Partner Violence: Not At Risk (09/12/2022)   Humiliation, Afraid, Rape, and Kick questionnaire    Fear of Current or Ex-Partner: No    Emotionally Abused: No    Physically Abused: No    Sexually Abused: No    Allergies  Allergen Reactions   Statins Other (See Comments)    Intolerance     Current Facility-Administered Medications  Medication Dose Route Frequency Provider Last Rate Last Admin   0.9 %  sodium  chloride infusion   Intravenous Continuous Arrien, York Ram, MD   Stopped at 09/18/22 1333   0.9 %  sodium chloride infusion   Intravenous Continuous Edmisten, Kristie L, PA 75 mL/hr at 09/19/22 0300 Infusion Verify at 09/19/22 0300   acetaminophen (TYLENOL) tablet 1,000 mg  1,000 mg Oral Q6H Edmisten, Kristie L, PA   1,000 mg at 09/19/22 0636   acetaminophen (TYLENOL) tablet 650 mg  650 mg Oral Q6H PRN Darlin Drop, DO   650 mg at 09/17/22 2148   alum & mag hydroxide-simeth (MAALOX/MYLANTA) 200-200-20 MG/5ML suspension 30 mL  30 mL Oral Q4H PRN Arrien, York Ram, MD   30 mL at 09/17/22 1258   aspirin EC tablet 81 mg  81 mg Oral Daily Darlin Drop, DO   81 mg at 09/18/22 1610   bisacodyl (DULCOLAX) suppository 10 mg  10 mg Rectal Daily PRN Edmisten, Kristie L, PA       diclofenac Sodium (VOLTAREN) 1 % topical gel 4 g  4 g Topical QID Arrien, York Ram, MD   4 g at 09/18/22 2331   diphenhydrAMINE (BENADRYL) capsule 25 mg  25 mg Oral Q6H PRN Dow Adolph N, DO   25 mg at 09/13/22 0515   docusate sodium (COLACE) capsule 100 mg  100 mg Oral BID Edmisten, Kristie L, PA   100 mg at 09/18/22 2332   ezetimibe (ZETIA) tablet 10 mg  10 mg Oral Daily Dow Adolph N, DO   10 mg at 09/18/22 0817   famotidine (PEPCID) tablet 40 mg  40 mg Oral Daily Dow Adolph N, DO   40 mg at 09/18/22 9604   feeding supplement (ENSURE ENLIVE / ENSURE PLUS) liquid 237 mL  237 mL Oral TID BM Arrien, York Ram, MD   237 mL at 09/18/22 2332   haloperidol (HALDOL) tablet 1 mg  1 mg Oral Q6H PRN Arrien, York Ram, MD       Or   haloperidol lactate (HALDOL) injection 1 mg  1 mg Intravenous Q6H PRN Arrien, York Ram, MD       insulin aspart (novoLOG) injection 0-5 Units  0-5 Units Subcutaneous QHS Hall, Carole N, DO       insulin aspart (novoLOG) injection 0-9 Units  0-9 Units Subcutaneous TID WC Dow Adolph N, DO   2 Units at 09/18/22 0819   melatonin tablet 5 mg  5 mg Oral QHS PRN Dow Adolph  N, DO   5 mg at 09/18/22 2332   menthol-cetylpyridinium (CEPACOL) lozenge 3 mg  1 lozenge Oral PRN Edmisten, Kristie L, PA       Or   phenol (CHLORASEPTIC) mouth spray 1 spray  1 spray Mouth/Throat PRN Edmisten, Kristie L, PA       methocarbamol (ROBAXIN) tablet 500 mg  500 mg Oral Q6H PRN Edmisten, Kristie L, PA       Or   methocarbamol (ROBAXIN) 500 mg in dextrose 5 % 50 mL IVPB  500 mg Intravenous Q6H PRN Edmisten, Kristie L, PA       metoCLOPramide (REGLAN) tablet 5-10 mg  5-10 mg Oral Q8H PRN Edmisten, Kristie L, PA       Or   metoCLOPramide (REGLAN) injection 5-10 mg  5-10 mg Intravenous Q8H PRN Edmisten, Kristie L, PA       metoprolol tartrate (LOPRESSOR) tablet 12.5 mg  12.5 mg Oral BID Runell Gess, MD   12.5 mg at 09/18/22 2332   morphine (PF) 2 MG/ML injection 1 mg  1 mg Intravenous Q2H PRN Arrien, York Ram, MD       multivitamin with minerals tablet 1 tablet  1 tablet Oral Daily Arrien, York Ram, MD   1 tablet at 09/18/22 0817   nafcillin 12 g in sodium chloride 0.9 % 500 mL continuous infusion  12 g Intravenous Q24H Kathlynn Grate, DO   Stopped at 09/18/22 1302   ondansetron (ZOFRAN) tablet 4 mg  4 mg Oral Q6H PRN Edmisten, Kristie L, PA       Or   ondansetron (ZOFRAN) injection 4 mg  4 mg Intravenous Q6H PRN Edmisten, Kristie L, PA       oxyCODONE (Oxy IR/ROXICODONE) immediate release tablet 5-10 mg  5-10 mg Oral Q4H PRN Edmisten, Kristie L, PA       polyethylene glycol (MIRALAX / GLYCOLAX) packet 17 g  17 g Oral Daily PRN Dow Adolph N, DO   17 g at 09/13/22 2057   simvastatin (ZOCOR) tablet 40 mg  40 mg Oral Daily Arrien, York Ram, MD   40 mg at 09/18/22 1610   sodium phosphate (FLEET) 7-19 GM/118ML enema 1 enema  1 enema Rectal Once PRN Edmisten, Kristie L, PA       thiamine (VITAMIN B1) 500 mg in sodium chloride 0.9 % 50 mL IVPB  500 mg Intravenous Q24H Coralie Keens, MD   Stopped at 09/17/22 1913   traMADol (ULTRAM) tablet 50 mg   50 mg Oral Q6H PRN Arrien, York Ram, MD   50 mg at 09/17/22 1837     Family History  Problem Relation Age of Onset   Dementia Mother    Dementia Father        Physical Exam: BP 135/62   Pulse 63   Temp 97.7 F (36.5 C) (Oral)   Resp 15   Ht 5\' 7"  (1.702 m)   Wt 94.6 kg   SpO2 95%   BMI 32.67 kg/m  Disoriented Peripherally warm Obese Card: with rr with soft systolic murmur    Diagnostic Studies & Laboratory data: I have personally reviewed the following studies and agree with the findings     Recent Radiology Findings:   CT ANGIO CHEST AORTA W/CM & OR WO/CM  Result Date: 09/19/2022 CLINICAL DATA:  Aortic infection or inflammation. EXAM: CT ANGIOGRAPHY CHEST WITH CONTRAST TECHNIQUE: Multidetector CT imaging of the chest was performed using the standard protocol during bolus administration of intravenous  contrast. Multiplanar CT image reconstructions and MIPs were obtained to evaluate the vascular anatomy. RADIATION DOSE REDUCTION: This exam was performed according to the departmental dose-optimization program which includes automated exposure control, adjustment of the mA and/or kV according to patient size and/or use of iterative reconstruction technique. 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 No dissection propagation into the descending aorta or the arch vessels is seen. At the level of the left inferior pulmonary vein, at the contours of the descending aorta are markedly irregular and there is surrounding intermediate density material. This is concerning for a contained aortic rupture or large penetrating atheromatous ulcer. This appearance is new compared to 07/21/2022. Mediastinum/Nodes: No mediastinal, hilar or axillary lymphadenopathy. Normal visualized thyroid. Thoracic esophageal  course is normal. Lungs/Pleura: There are bilateral pleural effusions. Upper Abdomen: Contrast bolus timing is not optimized for evaluation of the abdominal organs. The visualized portions of the organs of the upper abdomen are normal. Musculoskeletal: No chest wall abnormality. No bony spinal canal stenosis. Review of the MIP images confirms the above findings. IMPRESSION: 1. 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 2. 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 Vascular surgery consultation recommended. 3. Bilateral small pleural effusions. Critical Value/emergent results were called by telephone at the time of interpretation on 09/19/2022 at 2:08 am to Dr. Imogene Burn, who verbally acknowledged these results. Electronically Signed   By: Deatra Robinson M.D.   On: 09/19/2022 02:09   EP STUDY  Result Date: 09/18/2022 See surgical note for result.  ECHO TEE  Result Date: 09/18/2022    TRANSESOPHOGEAL ECHO REPORT   Patient Name:   Johnny [name] Date of Exam: 09/18/2022 Medical Rec #:  [id number]       Height:       67.0 in Accession #:    [id number]      Weight:       207.0 lb Date of Birth:  March 29, 1946       BSA:          2.052 m Patient Age:    44 years        BP:           134/76 mmHg Patient Gender: M               HR:           85 bpm. Exam Location:  Inpatient Procedure: Transesophageal Echo, Cardiac Doppler and Color Doppler Indications:     CHF  History:         Patient has prior history of Echocardiogram examinations, most                  recent 09/16/2022. Aortic Valve Disease; Risk                  Factors:Hypertension.  Sonographer:     Dondra Prader RVT Referring Phys:  [id number] Lillia Abed B ROBERTS Diagnosing Phys: Arvilla Meres MD PROCEDURE: The transesophogeal probe was passed without difficulty through the esophogus of the patient. Sedation performed by different  physician. The patient developed no complications during the procedure.  IMPRESSIONS  1. Left ventricular ejection fraction, by estimation, is 55 to 60%. The left ventricle has normal function. The left  ventricle demonstrates regional wall motion abnormalities (see scoring diagram/findings for description).  2. Right ventricular systolic function is mildly reduced. The right ventricular size is normal.  3. LAA not visulaized. No left atrial/left atrial appendage thrombus was detected.  4. No mitral valve vegetation. The mitral valve is normal in structure. Mild mitral valve regurgitation.  5. No tricuspid valve vegetation.  6. There is a small mobile vegetation on the aortic side of the valve with what appears to be surrounding abscess formation and dissection flap in the aortic root. The aortic valve is tricuspid. There is mild calcification of the aortic valve. Aortic valve regurgitation is mild. No aortic stenosis is present.  7. No pulmonic valve vegetation.  8. There is edema in the tissues surrounding the aortic valve as well as a dissection flap in the aortic root with what appears to be abscess formation or a false lumen. Given clinical picture I have high suspicion for abscess formation. Recommend CT imaging for further evaluation. D/w ID team. Aortic dilatation noted. There is mild dilatation of the aortic root, measuring 41 mm. FINDINGS  Left Ventricle: Left ventricular ejection fraction, by estimation, is 55 to 60%. The left ventricle has normal function. The left ventricle demonstrates regional wall motion abnormalities. The left ventricular internal cavity size was normal in size.  LV Wall Scoring: The mid inferoseptal segment and apical septal segment are hypokinetic. Right Ventricle: The right ventricular size is normal. No increase in right ventricular wall thickness. Right ventricular systolic function is mildly reduced. Left Atrium: LAA not visulaized. Left atrial size was normal in size. No left  atrial/left atrial appendage thrombus was detected. Right Atrium: Right atrial size was normal in size. Pericardium: There is no evidence of pericardial effusion. Mitral Valve: No mitral valve vegetation. The mitral valve is normal in structure. Mild mitral valve regurgitation. Tricuspid Valve: No tricuspid valve vegetation. The tricuspid valve is normal in structure. Tricuspid valve regurgitation is trivial. Aortic Valve: There is a small mobile vegetation on the aortic side of the valve with what appears to be surrounding abscess formation and dissection flap in the aortic root. The aortic valve is tricuspid. There is mild calcification of the aortic valve.  Aortic valve regurgitation is mild. No aortic stenosis is present. Pulmonic Valve: No pulmonic valve vegetation. The pulmonic valve was normal in structure. Pulmonic valve regurgitation is trivial. Aorta: There is edema in the tissues surrounding the aortic valve as well as a dissection flap in the aortic root with what appears to be abscess formation or a false lumen. Given clinical picture I have high suspicion for abscess formation. Recommend CT  imaging for further evaluation. D/w ID team. Aortic dilatation noted. There is mild dilatation of the aortic root, measuring 41 mm. IAS/Shunts: The interatrial septum appears to be lipomatous. No atrial level shunt detected by color flow Doppler. Arvilla Meres MD Electronically signed by Arvilla Meres MD Signature Date/Time: 09/18/2022/5:51:51 PM    Final (Updated)       Recent Lab Findings: Lab Results  Component Value Date   WBC 16.9 (H) 09/19/2022   HGB 9.5 (L) 09/19/2022   HCT 28.7 (L) 09/19/2022   PLT 421 (H) 09/19/2022   GLUCOSE 171 (H) 09/19/2022   CHOL 51 09/16/2022   TRIG 78 09/16/2022   HDL 14 (L) 09/16/2022   LDLCALC 21 09/16/2022   ALT 25 09/12/2022   AST 31 09/12/2022   NA 136 09/19/2022   K 4.2 09/19/2022   CL 101 09/19/2022  CREATININE 1.33 (H) 09/19/2022   BUN 34 (H)  09/19/2022   CO2 24 09/19/2022   INR 0.93 11/03/2010   HGBA1C 5.8 (H) 09/13/2022      Assessment / Plan:     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 Have reached out to family and will discuss options but would need CT scan of head and cath to evaluate grafts prior to surgery. Surgical intervention with very high mortality and morbidity and having a direction from family on wishes of pt would be helpful   I have spent 60 min in review of the records, viewing studies and in face to face with patient and in coordination of future care    Eugenio Hoes 09/19/2022 7:14 AM

## 2022-09-19 NOTE — Anesthesia Postprocedure Evaluation (Signed)
Anesthesia Post Note  Patient: Johnny [name]  Procedure(s) Performed: IRRIGATION AND DEBRIDEMENT KNEE WITH POLY EXCHANGE (Right: Knee) TRANSESOPHAGEAL ECHOCARDIOGRAM (Esophagus)     Patient location during evaluation: PACU Anesthesia Type: General Level of consciousness: awake and alert Pain management: pain level controlled Vital Signs Assessment: post-procedure vital signs reviewed and stable Respiratory status: spontaneous breathing, nonlabored ventilation, respiratory function stable and patient connected to nasal cannula oxygen Cardiovascular status: blood pressure returned to baseline and stable Postop Assessment: no apparent nausea or vomiting Anesthetic complications: no   No notable events documented.          Shelton Silvas

## 2022-09-20 DIAGNOSIS — I358 Other nonrheumatic aortic valve disorders: Secondary | ICD-10-CM | POA: Diagnosis not present

## 2022-09-20 DIAGNOSIS — I7100 Dissection of unspecified site of aorta: Secondary | ICD-10-CM | POA: Diagnosis present

## 2022-09-20 DIAGNOSIS — M00061 Staphylococcal arthritis, right knee: Secondary | ICD-10-CM | POA: Diagnosis not present

## 2022-09-20 DIAGNOSIS — Z7189 Other specified counseling: Secondary | ICD-10-CM

## 2022-09-20 DIAGNOSIS — Z515 Encounter for palliative care: Secondary | ICD-10-CM | POA: Diagnosis not present

## 2022-09-20 DIAGNOSIS — G9341 Metabolic encephalopathy: Secondary | ICD-10-CM | POA: Diagnosis not present

## 2022-09-20 DIAGNOSIS — I71019 Dissection of thoracic aorta, unspecified: Secondary | ICD-10-CM

## 2022-09-20 DIAGNOSIS — I71011 Dissection of aortic arch: Secondary | ICD-10-CM | POA: Diagnosis not present

## 2022-09-20 DIAGNOSIS — R7881 Bacteremia: Secondary | ICD-10-CM | POA: Diagnosis not present

## 2022-09-20 LAB — CBC WITH DIFFERENTIAL/PLATELET
Abs Immature Granulocytes: 0.6 10*3/uL — ABNORMAL HIGH (ref 0.00–0.07)
Basophils Absolute: 0 10*3/uL (ref 0.0–0.1)
Basophils Relative: 0 %
Eosinophils Absolute: 0 10*3/uL (ref 0.0–0.5)
Eosinophils Relative: 0 %
HCT: 30.4 % — ABNORMAL LOW (ref 39.0–52.0)
Hemoglobin: 10.1 g/dL — ABNORMAL LOW (ref 13.0–17.0)
Lymphocytes Relative: 5 %
Lymphs Abs: 1.4 10*3/uL (ref 0.7–4.0)
MCH: 32.5 pg (ref 26.0–34.0)
MCHC: 33.2 g/dL (ref 30.0–36.0)
MCV: 97.7 fL (ref 80.0–100.0)
Metamyelocytes Relative: 1 %
Monocytes Absolute: 0.8 10*3/uL (ref 0.1–1.0)
Monocytes Relative: 3 %
Myelocytes: 1 %
Neutro Abs: 24.8 10*3/uL — ABNORMAL HIGH (ref 1.7–7.7)
Neutrophils Relative %: 90 %
Platelets: 578 10*3/uL — ABNORMAL HIGH (ref 150–400)
RBC: 3.11 MIL/uL — ABNORMAL LOW (ref 4.22–5.81)
RDW: 13.8 % (ref 11.5–15.5)
WBC: 27.5 10*3/uL — ABNORMAL HIGH (ref 4.0–10.5)
nRBC: 0 % (ref 0.0–0.2)
nRBC: 0 /100 WBC

## 2022-09-20 LAB — BASIC METABOLIC PANEL
Anion gap: 13 (ref 5–15)
BUN: 62 mg/dL — ABNORMAL HIGH (ref 8–23)
CO2: 22 mmol/L (ref 22–32)
Calcium: 8.1 mg/dL — ABNORMAL LOW (ref 8.9–10.3)
Chloride: 102 mmol/L (ref 98–111)
Creatinine, Ser: 2.29 mg/dL — ABNORMAL HIGH (ref 0.61–1.24)
GFR, Estimated: 29 mL/min — ABNORMAL LOW (ref >60)
Glucose, Bld: 116 mg/dL — ABNORMAL HIGH (ref 70–99)
Potassium: 4.4 mmol/L (ref 3.5–5.1)
Sodium: 137 mmol/L (ref 135–145)

## 2022-09-20 LAB — CULTURE, BLOOD (ROUTINE X 2)
Culture: NO GROWTH
Special Requests: ADEQUATE

## 2022-09-20 LAB — GLUCOSE, CAPILLARY
Glucose-Capillary: 108 mg/dL — ABNORMAL HIGH (ref 70–99)
Glucose-Capillary: 143 mg/dL — ABNORMAL HIGH (ref 70–99)
Glucose-Capillary: 203 mg/dL — ABNORMAL HIGH (ref 70–99)
Glucose-Capillary: 170 mg/dL — ABNORMAL HIGH (ref 70–99)

## 2022-09-20 MED ORDER — SODIUM CHLORIDE 0.9 % IV SOLN: INTRAVENOUS | Status: AC

## 2022-09-20 MED ORDER — CLONAZEPAM 0.125 MG PO TBDP
0.1250 mg | ORAL_TABLET | Freq: Two times a day (BID) | ORAL | Status: DC | PRN
  Administered 2022-09-20: 0.125 mg via ORAL
  Filled 2022-09-20: qty 1

## 2022-09-20 MED ORDER — CHLORHEXIDINE GLUCONATE CLOTH 2 % EX PADS
6.0000 | MEDICATED_PAD | Freq: Every day | CUTANEOUS | Status: DC
  Administered 2022-09-20 – 2022-09-24 (×5): 6 via TOPICAL

## 2022-09-20 NOTE — Progress Notes (Signed)
Rounding Note    Patient Name: Johnny [name] Date of Encounter: 09/20/2022  [facility] Cardiologist: Nanetta Batty, MD   Subjective   Had some agitation and hallucinations overnight.  Received some sedatives and is currently hard to wake.  Inpatient Medications    Scheduled Meds:  amLODipine  5 mg Oral Daily   aspirin EC  81 mg Oral Daily   Chlorhexidine Gluconate Cloth  6 each Topical Daily   diclofenac Sodium  4 g Topical QID   docusate sodium  100 mg Oral BID   ezetimibe  10 mg Oral Daily   famotidine  40 mg Oral Daily   feeding supplement  237 mL Oral TID BM   insulin aspart  0-5 Units Subcutaneous QHS   insulin aspart  0-9 Units Subcutaneous TID WC   metoprolol tartrate  25 mg Oral BID   multivitamin with minerals  1 tablet Oral Daily   simvastatin  40 mg Oral Daily   Continuous Infusions:  methocarbamol (ROBAXIN) IV     nafcillin 12 g in sodium chloride 0.9 % 500 mL continuous infusion 12 g (09/19/22 1234)   thiamine (VITAMIN B1) injection 500 mg (09/19/22 1733)   PRN Meds: acetaminophen, alum & mag hydroxide-simeth, bisacodyl, diphenhydrAMINE, haloperidol **OR** haloperidol lactate, melatonin, menthol-cetylpyridinium **OR** phenol, methocarbamol **OR** methocarbamol (ROBAXIN) IV, metoCLOPramide **OR** metoCLOPramide (REGLAN) injection, morphine injection, ondansetron **OR** ondansetron (ZOFRAN) IV, oxyCODONE, polyethylene glycol, sodium phosphate, traMADol   Vital Signs    Vitals:   09/19/22 2045 09/19/22 2244 09/20/22 0005 09/20/22 0406  BP: (!) 148/76 (!) 125/95 134/87 (!) 150/80  Pulse: 75 75 (!) 58 64  Resp: 20  17 19   Temp: 98.2 F (36.8 C)   98.2 F (36.8 C)  TempSrc: Oral   Oral  SpO2: 98%     Weight:    94.6 kg  Height:        Intake/Output Summary (Last 24 hours) at 09/20/2022 0853 Last data filed at 09/20/2022 0544 Gross per 24 hour  Intake 474.27 ml  Output 350 ml  Net 124.27 ml      09/20/2022    4:06 AM 09/19/2022     3:41 AM 09/18/2022    3:18 AM  Last 3 Weights  Weight (lbs) 208 lb 8 oz 208 lb 9.6 oz 207 lb 0.2 oz  Weight (kg) 94.575 kg 94.62 kg 93.9 kg      Telemetry    Normal sinus rhythm with mild first-degree AV block- Personally Reviewed  ECG    No new tracing- Personally Reviewed  Physical Exam  Asleep/sedated GEN: No acute distress.   Neck: No JVD Cardiac: RRR, faint aortic ejection murmur no diastolic murmurs, rubs, or gallops.  Respiratory: Clear to auscultation bilaterally. GI: Soft, nontender, non-distended  MS: No edema; No deformity. Neuro:  Nonfocal  Psych: Normal affect   Labs    High Sensitivity Troponin:   Recent Labs  Lab 09/12/22 1311 09/12/22 1700  TROPONINIHS 183* 150*     Chemistry Recent Labs  Lab 09/14/22 0353 09/15/22 0228 09/18/22 0645 09/18/22 2143 09/19/22 0205 09/20/22 0239  NA 131*   < > 133*  --  136 137  K 3.6   < > 3.6  --  4.2 4.4  CL 93*   < > 96*  --  101 102  CO2 25   < > 22  --  24 22  GLUCOSE 118*   < > 152*  --  171* 116*  BUN 21   < >  33*  --  34* 62*  CREATININE 1.52*   < > 1.43* 1.34* 1.33* 2.29*  CALCIUM 8.4*   < > 9.7  --  8.9 8.1*  MG 1.8  --   --   --   --   --   GFRNONAA 47*   < > 50* 54* 55* 29*  ANIONGAP 13   < > 15  --  11 13   < > = values in this interval not displayed.    Lipids  Recent Labs  Lab 09/16/22 0646  CHOL 51  TRIG 78  HDL 14*  LDLCALC 21  CHOLHDL 3.6    Hematology Recent Labs  Lab 09/18/22 2143 09/19/22 0205 09/20/22 0239  WBC 19.2* 16.9* 27.5*  RBC 3.12* 2.92* 3.11*  HGB 10.4* 9.5* 10.1*  HCT 31.5* 28.7* 30.4*  MCV 101.0* 98.3 97.7  MCH 33.3 32.5 32.5  MCHC 33.0 33.1 33.2  RDW 13.6 13.7 13.8  PLT 439* 421* 578*   Thyroid No results for input(s): "TSH", "FREET4" in the last 168 hours.  BNPNo results for input(s): "BNP", "PROBNP" in the last 168 hours.  DDimer No results for input(s): "DDIMER" in the last 168 hours.   Radiology    CT ANGIO CHEST AORTA W/CM & OR WO/CM  Result  Date: 09/19/2022 CLINICAL DATA:  Aortic infection or inflammation. EXAM: CT ANGIOGRAPHY CHEST WITH CONTRAST TECHNIQUE: Multidetector CT imaging of the chest was performed using the standard protocol during bolus administration of intravenous contrast. Multiplanar CT image reconstructions and MIPs were obtained to evaluate the vascular anatomy. RADIATION DOSE REDUCTION: This exam was performed according to the departmental dose-optimization program which includes automated exposure control, adjustment of the mA and/or kV according to patient size and/or use of iterative reconstruction technique. 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 No dissection propagation into the descending aorta or the arch vessels is seen. At the level of the left inferior pulmonary vein, at the contours of the descending aorta are markedly irregular and there is surrounding intermediate density material. This is concerning for a contained aortic rupture or large penetrating atheromatous ulcer. This appearance is new compared to 07/21/2022. Mediastinum/Nodes: No mediastinal, hilar or axillary lymphadenopathy. Normal visualized thyroid. Thoracic esophageal course is normal. Lungs/Pleura: There are bilateral pleural effusions. Upper Abdomen: Contrast bolus timing is not optimized for evaluation of the abdominal organs. The visualized portions of the organs of the upper abdomen are normal. Musculoskeletal: No chest wall abnormality. No bony spinal canal stenosis. Review of the MIP images confirms the above findings. IMPRESSION: 1. 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 2. 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  Vascular surgery consultation recommended. 3. Bilateral small pleural effusions. Critical Value/emergent results were called by telephone at the time of interpretation on 09/19/2022 at 2:08 am to Dr. Imogene Burn, who verbally acknowledged these results. Electronically Signed   By: Deatra Robinson M.D.   On: 09/19/2022 02:09   EP STUDY  Result Date: 09/18/2022 See surgical note for result.  ECHO TEE  Result Date: 09/18/2022    TRANSESOPHOGEAL ECHO REPORT   Patient Name:   ARCHIVALDO SURETTE Date of Exam: 09/18/2022 Medical Rec #:  [id number]       Height:  67.0 in Accession #:    [id number]      Weight:       207.0 lb Date of Birth:  [birth year]       BSA:          2.052 m Patient Age:    77 years        BP:           134/76 mmHg Patient Gender: M               HR:           85 bpm. Exam Location:  Inpatient Procedure: Transesophageal Echo, Cardiac Doppler and Color Doppler Indications:     CHF  History:         Patient has prior history of Echocardiogram examinations, most                  recent 09/16/2022. Aortic Valve Disease; Risk                  Factors:Hypertension.  Sonographer:     Dondra Prader RVT Referring Phys:  [id number] Lillia Abed B ROBERTS Diagnosing Phys: Arvilla Meres MD PROCEDURE: The transesophogeal probe was passed without difficulty through the esophogus of the patient. Sedation performed by different physician. The patient developed no complications during the procedure.  IMPRESSIONS  1. Left ventricular ejection fraction, by estimation, is 55 to 60%. The left ventricle has normal function. The left ventricle demonstrates regional wall motion abnormalities (see scoring diagram/findings for description).  2. Right ventricular systolic function is mildly reduced. The right ventricular size is normal.  3. LAA not visulaized. No left atrial/left atrial appendage thrombus was detected.  4. No mitral valve vegetation. The mitral valve is normal in structure. Mild mitral valve regurgitation.  5. No tricuspid  valve vegetation.  6. There is a small mobile vegetation on the aortic side of the valve with what appears to be surrounding abscess formation and dissection flap in the aortic root. The aortic valve is tricuspid. There is mild calcification of the aortic valve. Aortic valve regurgitation is mild. No aortic stenosis is present.  7. No pulmonic valve vegetation.  8. There is edema in the tissues surrounding the aortic valve as well as a dissection flap in the aortic root with what appears to be abscess formation or a false lumen. Given clinical picture I have high suspicion for abscess formation. Recommend CT imaging for further evaluation. D/w ID team. Aortic dilatation noted. There is mild dilatation of the aortic root, measuring 41 mm. FINDINGS  Left Ventricle: Left ventricular ejection fraction, by estimation, is 55 to 60%. The left ventricle has normal function. The left ventricle demonstrates regional wall motion abnormalities. The left ventricular internal cavity size was normal in size.  LV Wall Scoring: The mid inferoseptal segment and apical septal segment are hypokinetic. Right Ventricle: The right ventricular size is normal. No increase in right ventricular wall thickness. Right ventricular systolic function is mildly reduced. Left Atrium: LAA not visulaized. Left atrial size was normal in size. No left atrial/left atrial appendage thrombus was detected. Right Atrium: Right atrial size was normal in size. Pericardium: There is no evidence of pericardial effusion. Mitral Valve: No mitral valve vegetation. The mitral valve is normal in structure. Mild mitral valve regurgitation. Tricuspid Valve: No tricuspid valve vegetation. The tricuspid valve is normal in structure. Tricuspid valve regurgitation is trivial. Aortic Valve: There is a small mobile vegetation on the aortic side of the valve with what  appears to be surrounding abscess formation and dissection flap in the aortic root. The aortic valve is  tricuspid. There is mild calcification of the aortic valve.  Aortic valve regurgitation is mild. No aortic stenosis is present. Pulmonic Valve: No pulmonic valve vegetation. The pulmonic valve was normal in structure. Pulmonic valve regurgitation is trivial. Aorta: There is edema in the tissues surrounding the aortic valve as well as a dissection flap in the aortic root with what appears to be abscess formation or a false lumen. Given clinical picture I have high suspicion for abscess formation. Recommend CT  imaging for further evaluation. D/w ID team. Aortic dilatation noted. There is mild dilatation of the aortic root, measuring 41 mm. IAS/Shunts: The interatrial septum appears to be lipomatous. No atrial level shunt detected by color flow Doppler. Arvilla Meres MD Electronically signed by Arvilla Meres MD Signature Date/Time: 09/18/2022/5:51:51 PM    Final (Updated)     Cardiac Studies   TEE 09/18/2022  1. Left ventricular ejection fraction, by estimation, is 55 to 60%. The left ventricle has normal function. The left ventricle demonstrates regional wall motion abnormalities (see scoring diagram/findings for description).   2. Right ventricular systolic function is mildly reduced. The right ventricular size is normal.   3. LAA not visulaized. No left atrial/left atrial appendage thrombus was detected.   4. No mitral valve vegetation. The mitral valve is normal in structure. Mild mitral valve regurgitation.   5. No tricuspid valve vegetation.  6. There is a small mobile vegetation on the aortic side of the valve with what appears to be surrounding abscess formation and dissection flap in the aortic root. The aortic valve is tricuspid. There is mild calcification of the aortic valve. Aortic valve regurgitation is mild. No aortic stenosis is present.   7. No pulmonic valve vegetation.   8. There is edema in the tissues surrounding the aortic valve as well as a dissection flap in the aortic root  with what appears to be abscess formation or a false lumen. Given clinical picture I have high suspicion for abscess formation. Recommend CT imaging for further evaluation. D/w ID team. Aortic dilatation noted. There is mild dilatation of the aortic root, measuring 41 mm.  Patient Profile     77 y.o. 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    Assessment & Plan    Mr. 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 Unfortunately, due to his comorbid conditions in particular previous bypass surgery with sternotomy over 30 years ago and severe widespread aortic calcifications, his surgical risk for aortic root repair/aortic valve replacement is prohibitive. Clinical course complicated by altered mental status. Note worsening of  leukocytosis despite antibiotic therapy. Significant deterioration in renal function since admission.  This may be due to lack of fluid intake. Conservative/palliative management has been  recommended. Now DNR. Goals of care meeting with his family later today.      For questions or updates, please contact [facility] Please consult [email or web address] for contact info under        Signed, Thurmon Fair, MD  09/20/2022, 8:53 AM

## 2022-09-20 NOTE — Progress Notes (Signed)
Patient seen and examined, Mr. 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 -With delirium, encephalopathy -Cards, CT surgery consulted, felt to be too high risk for mortality, no plans for surgery, palliative care has been recommended -Discussed with patient's son at length yesterday -For palliative care meeting today, anticipate demise anytime  Zannie Cove, MD

## 2022-09-20 NOTE — Progress Notes (Addendum)
PT Cancellation Note  Patient Details Name: Johnny [name] MRN: [id number] DOB: 07-15-1945   Cancelled Treatment:    Reason Eval/Treat Not Completed: Medical issues which prohibited therapy (pt/family to have GOC discussion with PMT this afternoon with progression of MSSA bacteremia, septic prosthetic knee, aortic valve endocarditis with aortic root abscess with dissection and not candidate for repair.)   Wynn Maudlin, DPT Acute Rehabilitation Services Office [phone]  09/20/22 11:58 AM

## 2022-09-20 NOTE — Progress Notes (Signed)
Subjective:  Is complaining that today is the worse day than yesterday. He says there are "many other sick ppl in the hospital that also need care."  He is much more lucid and conversant today and encephalopathy appears to be clearing  Antibiotics:  Anti-infectives (From admission, onward)    Start     Dose/Rate Route Frequency Ordered Stop   09/19/22 0600  ceFAZolin (ANCEF) IVPB 2g/100 mL premix        2 g 200 mL/hr over 30 Minutes Intravenous On call to O.R. 09/18/22 1129 09/18/22 1657   09/18/22 1100  nafcillin 12 g in sodium chloride 0.9 % 500 mL continuous infusion        12 g 20.8 mL/hr over 24 Hours Intravenous Every 24 hours 09/18/22 1006     09/16/22 1030  ceFAZolin (ANCEF) IVPB 2g/100 mL premix  Status:  Discontinued        2 g 200 mL/hr over 30 Minutes Intravenous Every 8 hours 09/16/22 0944 09/18/22 1006   09/15/22 1830  cefTRIAXone (ROCEPHIN) 2 g in sodium chloride 0.9 % 100 mL IVPB  Status:  Discontinued        2 g 200 mL/hr over 30 Minutes Intravenous Every 24 hours 09/15/22 1736 09/16/22 0944       Medications: Scheduled Meds:  amLODipine  5 mg Oral Daily   aspirin EC  81 mg Oral Daily   Chlorhexidine Gluconate Cloth  6 each Topical Daily   diclofenac Sodium  4 g Topical QID   docusate sodium  100 mg Oral BID   ezetimibe  10 mg Oral Daily   famotidine  40 mg Oral Daily   feeding supplement  237 mL Oral TID BM   insulin aspart  0-5 Units Subcutaneous QHS   insulin aspart  0-9 Units Subcutaneous TID WC   metoprolol tartrate  25 mg Oral BID   multivitamin with minerals  1 tablet Oral Daily   simvastatin  40 mg Oral Daily   Continuous Infusions:  sodium chloride 75 mL/hr at 09/20/22 1022   methocarbamol (ROBAXIN) IV     nafcillin 12 g in sodium chloride 0.9 % 500 mL continuous infusion 12 g (09/20/22 1039)   thiamine (VITAMIN B1) injection 500 mg (09/19/22 1733)   PRN Meds:.acetaminophen, alum & mag hydroxide-simeth, bisacodyl, diphenhydrAMINE,  haloperidol **OR** haloperidol lactate, melatonin, menthol-cetylpyridinium **OR** phenol, methocarbamol **OR** methocarbamol (ROBAXIN) IV, metoCLOPramide **OR** metoCLOPramide (REGLAN) injection, morphine injection, ondansetron **OR** ondansetron (ZOFRAN) IV, oxyCODONE, polyethylene glycol, sodium phosphate, traMADol    Objective: Weight change: -0.045 kg  Intake/Output Summary (Last 24 hours) at 09/20/2022 1107 Last data filed at 09/20/2022 1020 Gross per 24 hour  Intake 474.27 ml  Output 600 ml  Net -125.73 ml    Blood pressure (!) 150/80, pulse 64, temperature 98.2 F (36.8 C), temperature source Oral, resp. rate 19, height 5\' 7"  (1.702 m), weight 94.6 kg, SpO2 98 %. Temp:  [98.2 F (36.8 C)] 98.2 F (36.8 C) (05/26 0406) Pulse Rate:  [58-75] 64 (05/26 0406) Resp:  [17-20] 19 (05/26 0406) BP: (124-150)/(70-95) 150/80 (05/26 0406) SpO2:  [98 %] 98 % (05/25 2045) Weight:  [94.6 kg] 94.6 kg (05/26 0406)  Physical Exam: Physical Exam Constitutional:      Appearance: He is well-developed. He is obese.  HENT:     Head: Normocephalic and atraumatic.  Eyes:     Conjunctiva/sclera: Conjunctivae normal.  Cardiovascular:     Rate and Rhythm: Normal rate and  regular rhythm.  Pulmonary:     Effort: Pulmonary effort is normal. No respiratory distress.     Breath sounds: No wheezing.  Abdominal:     General: There is no distension.     Palpations: Abdomen is soft.  Musculoskeletal:        General: Normal range of motion.     Cervical back: Normal range of motion and neck supple.  Skin:    General: Skin is warm and dry.     Findings: No erythema or rash.  Neurological:     General: No focal deficit present.     Mental Status: He is alert.  Psychiatric:        Attention and Perception: Attention normal.        Speech: Speech is delayed.        Behavior: Behavior normal.      CBC:    BMET Recent Labs    09/19/22 0205 09/20/22 0239  NA 136 137  K 4.2 4.4  CL 101 102   CO2 24 22  GLUCOSE 171* 116*  BUN 34* 62*  CREATININE 1.33* 2.29*  CALCIUM 8.9 8.1*      Liver Panel  No results for input(s): "PROT", "ALBUMIN", "AST", "ALT", "ALKPHOS", "BILITOT", "BILIDIR", "IBILI" in the last 72 hours.     Sedimentation Rate No results for input(s): "ESRSEDRATE" in the last 72 hours. C-Reactive Protein No results for input(s): "CRP" in the last 72 hours.  Micro Results: Recent Results (from the past 720 hour(s))  SARS Coronavirus 2 by RT PCR (hospital order, performed in [facility] hospital lab) *cepheid single result test* Anterior Nasal Swab     Status: None   Collection Time: 09/12/22  2:14 PM   Specimen: Anterior Nasal Swab  Result Value Ref Range Status   SARS Coronavirus 2 by RT PCR NEGATIVE NEGATIVE Final    Comment: (NOTE) SARS-CoV-2 target nucleic acids are NOT DETECTED.  The SARS-CoV-2 RNA is generally detectable in upper and lower respiratory specimens during the acute phase of infection. The lowest concentration of SARS-CoV-2 viral copies this assay can detect is 250 copies / mL. A negative result does not preclude SARS-CoV-2 infection and should not be used as the sole basis for treatment or other patient management decisions.  A negative result may occur with improper specimen collection / handling, submission of specimen other than nasopharyngeal swab, presence of viral mutation(s) within the areas targeted by this assay, and inadequate number of viral copies (<250 copies / mL). A negative result must be combined with clinical observations, patient history, and epidemiological information.  Fact Sheet for Patients:   RoadLapTop.co.za  Fact Sheet for Healthcare Providers: http://kim-miller.com/  This test is not yet approved or  cleared by the [place] FDA and has been authorized for detection and/or diagnosis of SARS-CoV-2 by FDA under an Emergency Use Authorization (EUA).  This  EUA will remain in effect (meaning this test can be used) for the duration of the COVID-19 declaration under Section 564(b)(1) of the Act, 21 U.S.C. section 360bbb-3(b)(1), unless the authorization is terminated or revoked sooner.  Performed at [facility], [address] [place], [place] [address]   MRSA Next Gen by PCR, Nasal     Status: None   Collection Time: 09/13/22  5:15 AM   Specimen: Nasal Mucosa; Nasal Swab  Result Value Ref Range Status   MRSA by PCR Next Gen NOT DETECTED NOT DETECTED Final    Comment: (NOTE) The GeneXpert  MRSA Assay (FDA approved for NASAL specimens only), is one component of a comprehensive MRSA colonization surveillance program. It is not intended to diagnose MRSA infection nor to guide or monitor treatment for MRSA infections. Test performance is not FDA approved in patients less than 55 years old. Performed at [facility], [address] [place], [place] [address]   Culture, blood (Routine X 2) w Reflex to ID Panel     Status: Abnormal   Collection Time: 09/15/22  5:55 PM   Specimen: BLOOD RIGHT ARM  Result Value Ref Range Status   Specimen Description BLOOD RIGHT ARM  Final   Special Requests   Final    BOTTLES DRAWN AEROBIC AND ANAEROBIC Blood Culture adequate volume   Culture  Setup Time   Final    GRAM POSITIVE COCCI IN CLUSTERS IN BOTH AEROBIC AND ANAEROBIC BOTTLES CRITICAL VALUE NOTED.  VALUE IS CONSISTENT WITH PREVIOUSLY REPORTED AND CALLED VALUE.    Culture (A)  Final    STAPHYLOCOCCUS AUREUS SUSCEPTIBILITIES PERFORMED ON PREVIOUS CULTURE WITHIN THE LAST 5 DAYS. Performed at [facility], [address] [place], [place] [address]    Report Status 09/18/2022 FINAL  Final  Culture, blood (Routine X 2) w Reflex to ID Panel     Status: Abnormal   Collection Time: 09/15/22  5:55 PM   Specimen: BLOOD RIGHT ARM  Result Value Ref Range Status   Specimen Description BLOOD RIGHT ARM  Final   Special Requests    Final    BOTTLES DRAWN AEROBIC AND ANAEROBIC Blood Culture adequate volume   Culture  Setup Time   Final    GRAM POSITIVE COCCI IN CLUSTERS IN BOTH AEROBIC AND ANAEROBIC BOTTLES CRITICAL RESULT CALLED TO, READ BACK BY AND VERIFIED WITH: A. PAYTES PHARMD, AT 2951 09/16/22 Renato Shin Performed at [facility], [address] [place], [place] [address]    Culture STAPHYLOCOCCUS AUREUS (A)  Final   Report Status 09/18/2022 FINAL  Final   Organism ID, Bacteria STAPHYLOCOCCUS AUREUS  Final      Susceptibility   Staphylococcus aureus - MIC*    CIPROFLOXACIN <=0.5 SENSITIVE Sensitive     ERYTHROMYCIN <=0.25 SENSITIVE Sensitive     GENTAMICIN <=0.5 SENSITIVE Sensitive     OXACILLIN 0.5 SENSITIVE Sensitive     TETRACYCLINE <=1 SENSITIVE Sensitive     VANCOMYCIN <=0.5 SENSITIVE Sensitive     TRIMETH/SULFA <=10 SENSITIVE Sensitive     CLINDAMYCIN <=0.25 SENSITIVE Sensitive     RIFAMPIN <=0.5 SENSITIVE Sensitive     Inducible Clindamycin NEGATIVE Sensitive     LINEZOLID 2 SENSITIVE Sensitive     * STAPHYLOCOCCUS AUREUS  Blood Culture ID Panel (Reflexed)     Status: Abnormal   Collection Time: 09/15/22  5:55 PM  Result Value Ref Range Status   Enterococcus faecalis NOT DETECTED NOT DETECTED Final   Enterococcus Faecium NOT DETECTED NOT DETECTED Final   Listeria monocytogenes NOT DETECTED NOT DETECTED Final   Staphylococcus species DETECTED (A) NOT DETECTED Final    Comment: CRITICAL RESULT CALLED TO, READ BACK BY AND VERIFIED WITH: A. PAYTES PHARMD, AT 6063 09/16/22 D. VANHOOK    Staphylococcus aureus (BCID) DETECTED (A) NOT DETECTED Final    Comment: CRITICAL RESULT CALLED TO, READ BACK BY AND VERIFIED WITH: A. PAYTES PHARMD, AT 0160 09/16/22 D. VANHOOK    Staphylococcus epidermidis NOT DETECTED NOT DETECTED Final   Staphylococcus lugdunensis NOT DETECTED NOT DETECTED Final   Streptococcus species NOT  DETECTED NOT DETECTED Final   Streptococcus agalactiae NOT DETECTED NOT DETECTED  Final   Streptococcus pneumoniae NOT DETECTED NOT DETECTED Final   Streptococcus pyogenes NOT DETECTED NOT DETECTED Final   A.calcoaceticus-baumannii NOT DETECTED NOT DETECTED Final   Bacteroides fragilis NOT DETECTED NOT DETECTED Final   Enterobacterales NOT DETECTED NOT DETECTED Final   Enterobacter cloacae complex NOT DETECTED NOT DETECTED Final   Escherichia coli NOT DETECTED NOT DETECTED Final   Klebsiella aerogenes NOT DETECTED NOT DETECTED Final   Klebsiella oxytoca NOT DETECTED NOT DETECTED Final   Klebsiella pneumoniae NOT DETECTED NOT DETECTED Final   Proteus species NOT DETECTED NOT DETECTED Final   Salmonella species NOT DETECTED NOT DETECTED Final   Serratia marcescens NOT DETECTED NOT DETECTED Final   Haemophilus influenzae NOT DETECTED NOT DETECTED Final   Neisseria meningitidis NOT DETECTED NOT DETECTED Final   Pseudomonas aeruginosa NOT DETECTED NOT DETECTED Final   Stenotrophomonas maltophilia NOT DETECTED NOT DETECTED Final   Candida albicans NOT DETECTED NOT DETECTED Final   Candida auris NOT DETECTED NOT DETECTED Final   Candida glabrata NOT DETECTED NOT DETECTED Final   Candida krusei NOT DETECTED NOT DETECTED Final   Candida parapsilosis NOT DETECTED NOT DETECTED Final   Candida tropicalis NOT DETECTED NOT DETECTED Final   Cryptococcus neoformans/gattii NOT DETECTED NOT DETECTED Final   Meth resistant mecA/C and MREJ NOT DETECTED NOT DETECTED Final    Comment: Performed at [facility], [address] [place], [place] [address]  Body fluid culture w Gram Stain     Status: None   Collection Time: 09/16/22  3:59 PM   Specimen: Synovium; Body Fluid  Result Value Ref Range Status   Specimen Description SYNOVIAL  Final   Special Requests RIGHT KNEE  Final   Gram Stain   Final    ABUNDANT WBC PRESENT, PREDOMINANTLY PMN FEW GRAM POSITIVE COCCI IN PAIRS CRITICAL RESULT CALLED TO, READ BACK BY AND VERIFIED WITH: RN PEYTON RIDGE ON 09/16/22 @ 1647 BY  DRT Performed at [facility], [address] [place], [place] [address]    Culture ABUNDANT STAPHYLOCOCCUS AUREUS  Final   Report Status 09/18/2022 FINAL  Final   Organism ID, Bacteria STAPHYLOCOCCUS AUREUS  Final      Susceptibility   Staphylococcus aureus - MIC*    CIPROFLOXACIN <=0.5 SENSITIVE Sensitive     ERYTHROMYCIN <=0.25 SENSITIVE Sensitive     GENTAMICIN <=0.5 SENSITIVE Sensitive     OXACILLIN <=0.25 SENSITIVE Sensitive     TETRACYCLINE <=1 SENSITIVE Sensitive     VANCOMYCIN <=0.5 SENSITIVE Sensitive     TRIMETH/SULFA <=10 SENSITIVE Sensitive     CLINDAMYCIN <=0.25 SENSITIVE Sensitive     RIFAMPIN <=0.5 SENSITIVE Sensitive     Inducible Clindamycin NEGATIVE Sensitive     LINEZOLID 2 SENSITIVE Sensitive     * ABUNDANT STAPHYLOCOCCUS AUREUS  Culture, blood (Routine X 2) w Reflex to ID Panel     Status: None (Preliminary result)   Collection Time: 09/18/22  6:31 AM   Specimen: BLOOD  Result Value Ref Range Status   Specimen Description BLOOD RIGHT ANTECUBITAL  Final   Special Requests   Final    BOTTLES DRAWN AEROBIC AND ANAEROBIC Blood Culture results may not be optimal due to an inadequate volume of blood received in culture bottles   Culture   Final    NO GROWTH 2 DAYS Performed at [facility], [address], [place], [place] [address]  Report Status PENDING  Incomplete  Culture, blood (Routine X 2) w Reflex to ID Panel     Status: None (Preliminary result)   Collection Time: 09/18/22  6:42 AM   Specimen: BLOOD RIGHT ARM  Result Value Ref Range Status   Specimen Description BLOOD RIGHT ARM  Final   Special Requests   Final    BOTTLES DRAWN AEROBIC ONLY Blood Culture adequate volume   Culture   Final    NO GROWTH 2 DAYS Performed at [facility], [address], [place], [place] [address]    Report Status PENDING  Incomplete    Studies/Results: CT ANGIO CHEST AORTA W/CM & OR WO/CM  Result Date: 09/19/2022 CLINICAL DATA:  Aortic  infection or inflammation. EXAM: CT ANGIOGRAPHY CHEST WITH CONTRAST TECHNIQUE: Multidetector CT imaging of the chest was performed using the standard protocol during bolus administration of intravenous contrast. Multiplanar CT image reconstructions and MIPs were obtained to evaluate the vascular anatomy. RADIATION DOSE REDUCTION: This exam was performed according to the departmental dose-optimization program which includes automated exposure control, adjustment of the mA and/or kV according to patient size and/or use of iterative reconstruction technique. 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 No dissection propagation into the descending aorta or the arch vessels is seen. At the level of the left inferior pulmonary vein, at the contours of the descending aorta are markedly irregular and there is surrounding intermediate density material. This is concerning for a contained aortic rupture or large penetrating atheromatous ulcer. This appearance is new compared to 07/21/2022. Mediastinum/Nodes: No mediastinal, hilar or axillary lymphadenopathy. Normal visualized thyroid. Thoracic esophageal course is normal. Lungs/Pleura: There are bilateral pleural effusions. Upper Abdomen: Contrast bolus timing is not optimized for evaluation of the abdominal organs. The visualized portions of the organs of the upper abdomen are normal. Musculoskeletal: No chest wall abnormality. No bony spinal canal stenosis. Review of the MIP images confirms the above findings. IMPRESSION: 1. 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 2. 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 Vascular surgery consultation recommended.  3. Bilateral small pleural effusions. Critical Value/emergent results were called by telephone at the time of interpretation on 09/19/2022 at 2:08 am to Dr. Imogene Burn, who verbally acknowledged these results. Electronically Signed   By: Deatra Robinson M.D.   On: 09/19/2022 02:09   EP STUDY  Result Date: 09/18/2022 See surgical note for result.  ECHO TEE  Result Date: 09/18/2022    TRANSESOPHOGEAL ECHO REPORT   Patient Name:   Johnny [name] Date of Exam: 09/18/2022 Medical Rec #:  [id number]       Height:       67.0 in Accession #:    [id number]      Weight:       207.0 lb Date of Birth:  10-10-1945       BSA:          2.052 m Patient Age:    76 years        BP:           134/76 mmHg Patient Gender: M               HR:  85 bpm. Exam Location:  Inpatient Procedure: Transesophageal Echo, Cardiac Doppler and Color Doppler Indications:     CHF  History:         Patient has prior history of Echocardiogram examinations, most                  recent 09/16/2022. Aortic Valve Disease; Risk                  Factors:Hypertension.  Sonographer:     Dondra Prader RVT Referring Phys:  [id number] Lillia Abed B ROBERTS Diagnosing Phys: Arvilla Meres MD PROCEDURE: The transesophogeal probe was passed without difficulty through the esophogus of the patient. Sedation performed by different physician. The patient developed no complications during the procedure.  IMPRESSIONS  1. Left ventricular ejection fraction, by estimation, is 55 to 60%. The left ventricle has normal function. The left ventricle demonstrates regional wall motion abnormalities (see scoring diagram/findings for description).  2. Right ventricular systolic function is mildly reduced. The right ventricular size is normal.  3. LAA not visulaized. No left atrial/left atrial appendage thrombus was detected.  4. No mitral valve vegetation. The mitral valve is normal in structure. Mild mitral valve regurgitation.  5. No tricuspid valve vegetation.  6. There is a small  mobile vegetation on the aortic side of the valve with what appears to be surrounding abscess formation and dissection flap in the aortic root. The aortic valve is tricuspid. There is mild calcification of the aortic valve. Aortic valve regurgitation is mild. No aortic stenosis is present.  7. No pulmonic valve vegetation.  8. There is edema in the tissues surrounding the aortic valve as well as a dissection flap in the aortic root with what appears to be abscess formation or a false lumen. Given clinical picture I have high suspicion for abscess formation. Recommend CT imaging for further evaluation. D/w ID team. Aortic dilatation noted. There is mild dilatation of the aortic root, measuring 41 mm. FINDINGS  Left Ventricle: Left ventricular ejection fraction, by estimation, is 55 to 60%. The left ventricle has normal function. The left ventricle demonstrates regional wall motion abnormalities. The left ventricular internal cavity size was normal in size.  LV Wall Scoring: The mid inferoseptal segment and apical septal segment are hypokinetic. Right Ventricle: The right ventricular size is normal. No increase in right ventricular wall thickness. Right ventricular systolic function is mildly reduced. Left Atrium: LAA not visulaized. Left atrial size was normal in size. No left atrial/left atrial appendage thrombus was detected. Right Atrium: Right atrial size was normal in size. Pericardium: There is no evidence of pericardial effusion. Mitral Valve: No mitral valve vegetation. The mitral valve is normal in structure. Mild mitral valve regurgitation. Tricuspid Valve: No tricuspid valve vegetation. The tricuspid valve is normal in structure. Tricuspid valve regurgitation is trivial. Aortic Valve: There is a small mobile vegetation on the aortic side of the valve with what appears to be surrounding abscess formation and dissection flap in the aortic root. The aortic valve is tricuspid. There is mild calcification of  the aortic valve.  Aortic valve regurgitation is mild. No aortic stenosis is present. Pulmonic Valve: No pulmonic valve vegetation. The pulmonic valve was normal in structure. Pulmonic valve regurgitation is trivial. Aorta: There is edema in the tissues surrounding the aortic valve as well as a dissection flap in the aortic root with what appears to be abscess formation or a false lumen. Given clinical picture I have high suspicion for abscess formation. Recommend CT  imaging for further evaluation. D/w ID team. Aortic dilatation noted. There is mild dilatation of the aortic root, measuring 41 mm. IAS/Shunts: The interatrial septum appears to be lipomatous. No atrial level shunt detected by color flow Doppler. 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 He underwent I&D with poly exchange yesterday in the operating room at which point time he also underwent a transesophageal echocardiogram.  Dr. Doy Hutching with cardiothoracic surgery has evaluated the patient and has discussed with  cardiology and AHF team and reached consensus that pt would not survive any attempt in the operating room to a repair of the ruptured abscess dissection of his aortic root.  Patient is much more lucid today and he himself told me that he had pathology in his aorta.  He seems to understand gravity of the situation and that we will not be able to cure this with antibiotics and that the operative risk is felt to be too high  We will continue nafcillin for now since there is high concern for CNS septic embolism  Palliative care going to meet with him and family today.    I am in favor to moving to comfort measures  Certainly we can continue to give nafcillin, or cefazolin or even switch to oral antibiotics if antibiotic therapy is desired.  Antibiotics CANNOT fix his aortic abscess and dissection but have stabilized his bacteremia at present    I have personally spent 50  minutes involved in face-to-face and non-face-to-face activities for this patient on the day of the visit. Professional time spent includes the following activities: Preparing to see the patient (review of tests), Obtaining and/or reviewing separately obtained history (admission/discharge record), Performing a medically appropriate examination and/or evaluation , Ordering medications/tests/procedures, referring and communicating with other health care professionals, Documenting clinical information in the EMR, Independently interpreting results (not separately reported), Communicating results to the patient/family/caregiver, Counseling and educating the patient/family/caregiver and Care coordination (not separately reported).     LOS: 7 days   Acey Lav 09/20/2022, 11:07 AM

## 2022-09-21 DIAGNOSIS — I330 Acute and subacute infective endocarditis: Secondary | ICD-10-CM

## 2022-09-21 DIAGNOSIS — Z7189 Other specified counseling: Secondary | ICD-10-CM | POA: Diagnosis not present

## 2022-09-21 DIAGNOSIS — I7101 Dissection of ascending aorta: Secondary | ICD-10-CM

## 2022-09-21 DIAGNOSIS — Z515 Encounter for palliative care: Secondary | ICD-10-CM | POA: Diagnosis not present

## 2022-09-21 LAB — CULTURE, BLOOD (ROUTINE X 2)

## 2022-09-21 LAB — GLUCOSE, CAPILLARY
Glucose-Capillary: 126 mg/dL — ABNORMAL HIGH (ref 70–99)
Glucose-Capillary: 145 mg/dL — ABNORMAL HIGH (ref 70–99)
Glucose-Capillary: 128 mg/dL — ABNORMAL HIGH (ref 70–99)
Glucose-Capillary: 116 mg/dL — ABNORMAL HIGH (ref 70–99)

## 2022-09-21 NOTE — Plan of Care (Signed)

## 2022-09-21 NOTE — Progress Notes (Signed)
Palliative Medicine Inpatient Follow Up Note HPI: 76/M w CAD/CABG, abdominal aortic aneurysm, hypertension, dyslipidemia presented to the ED with dyspnea. Has had a complicated hospitalization and is now in a position whereby he has a descending aortic aneurysm, aortic valve abscess, endocarditis, and bacteremia - MSSA. 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   Today's Discussion 09/21/2022  *Please note that this is a verbal dictation therefore any spelling or grammatical errors are due to the "Dragon [employer] One" system interpretation.  Chart reviewed inclusive of vital signs, progress notes, laboratory results, and diagnostic images.   I met with Johnny [name] this morning. We placed his son, Johnny [name] on speaker-phone. We reviewed the difficulty of the conversations from the day prior. We discussed that at this time patient and his family still have some things to get in order. They plan to have their financial advisor assist them later in the day.   Created space and opportunity for patient to explore thoughts feelings and fears regarding current medical situation. He asked me for some rum as it would help to "calm his nerves". I shared that we do have something that can help his anxiety which he is agreeable to getting.   Johnny [name] is starting to vocalize understanding that he is severely ill and has limited time left on earth. I shared that at this  point it is most important to determine what he wants that time to look like. He and I discussed him meeting with the hospice liaison today which he is in agreement with. Patients son shares that he will be bringing patients spouse this afternoon to him.   After Johnny [name] hung up I was able to speak more to Advance who shared with me the love he has for his wife. He expresses that she is "something". He  became quite emotional. Time and support were provided through therapeutic listening.   I summarize the plan for the day prior to leaving the room.    Questions and concerns addressed/Palliative Support Provided.   Objective Assessment: Vital Signs Vitals:   09/20/22 2243 09/21/22 0507  BP: 120/63 137/68  Pulse: 70 68  Resp:  (!) 22  Temp:  97.9 F (36.6 C)  SpO2:      Intake/Output Summary (Last 24 hours) at 09/21/2022 0865 Last data filed at 09/21/2022 7846 Gross per 24 hour  Intake 507.32 ml  Output 775 ml  Net -267.68 ml   Last Weight  Most recent update: 09/21/2022  5:09 AM    Weight  96.3 kg (212 lb 3.2 oz)            Gen: Elderly Caucasian male in no acute distress HEENT: Moist mucous membranes CV: Regular rate and rhythm PULM: On room air breathing is even and unlabored ABD: Abdominal distention EXT: Right foot wrapped Neuro: Alert and oriented x3  SUMMARY OF RECOMMENDATIONS   DNAR/DNI  Patient is coming to terms with his present health state   TOC - [facility] liaison meeting with patient and his family to better explain services  Ongoing PMT support  Billing based on MDM: High ______________________________________________________________________________________ Johnny [name] [place] Palliative Medicine Team Team Cell Phone: [phone] Please utilize secure chat with additional questions, if there is no response within 30 minutes please call the above phone number  Palliative Medicine Team providers are available by phone  from 7am to 7pm daily and can be reached through the team cell phone.  Should this patient require assistance outside of these hours, please call the patient's attending physician.

## 2022-09-21 NOTE — Progress Notes (Signed)
Rounding Note    Patient Name: Johnny [name] Date of Encounter: 09/21/2022  [facility] Cardiologist: Nanetta Batty, MD   Subjective   Seems somewhat depressed this morning but alert and communicative.  He denies pain.  Inpatient Medications    Scheduled Meds:  amLODipine  5 mg Oral Daily   aspirin EC  81 mg Oral Daily   Chlorhexidine Gluconate Cloth  6 each Topical Daily   diclofenac Sodium  4 g Topical QID   docusate sodium  100 mg Oral BID   ezetimibe  10 mg Oral Daily   famotidine  40 mg Oral Daily   feeding supplement  237 mL Oral TID BM   insulin aspart  0-5 Units Subcutaneous QHS   insulin aspart  0-9 Units Subcutaneous TID WC   metoprolol tartrate  25 mg Oral BID   multivitamin with minerals  1 tablet Oral Daily   simvastatin  40 mg Oral Daily   Continuous Infusions:  sodium chloride 75 mL/hr at 09/21/22 0506   methocarbamol (ROBAXIN) IV     nafcillin 12 g in sodium chloride 0.9 % 500 mL continuous infusion 12 g (09/20/22 1039)   PRN Meds: acetaminophen, alum & mag hydroxide-simeth, bisacodyl, clonazepam, diphenhydrAMINE, haloperidol **OR** haloperidol lactate, melatonin, menthol-cetylpyridinium **OR** phenol, methocarbamol **OR** methocarbamol (ROBAXIN) IV, metoCLOPramide **OR** metoCLOPramide (REGLAN) injection, morphine injection, ondansetron **OR** ondansetron (ZOFRAN) IV, oxyCODONE, polyethylene glycol, sodium phosphate, traMADol   Vital Signs    Vitals:   09/20/22 2100 09/20/22 2243 09/21/22 0507 09/21/22 0735  BP: (!) 112/59 120/63 137/68 130/64  Pulse: 75 70 68 68  Resp: (!) 22  (!) 22 20  Temp: 97.7 F (36.5 C)  97.9 F (36.6 C) 98.4 F (36.9 C)  TempSrc: Oral  Axillary Oral  SpO2:      Weight:   96.3 kg   Height:        Intake/Output Summary (Last 24 hours) at 09/21/2022 0801 Last data filed at 09/21/2022 0506 Gross per 24 hour  Intake 507.32 ml  Output 775 ml  Net -267.68 ml      09/21/2022    5:07 AM 09/20/2022    4:06  AM 09/19/2022    3:41 AM  Last 3 Weights  Weight (lbs) 212 lb 3.2 oz 208 lb 8 oz 208 lb 9.6 oz  Weight (kg) 96.253 kg 94.575 kg 94.62 kg      Telemetry    Sinus rhythm, sinus tach - Personally Reviewed  ECG    No new tracing   Physical Exam   GEN: No acute distress.   Neck: No JVD Cardiac: RRR, no murmurs, rubs, or gallops.  Respiratory: Clear to auscultation bilaterally. GI: Soft, nontender, non-distended  MS: No edema; Right knee swelling Neuro:  Nonfocal  Psych: Normal affect   Labs    High Sensitivity Troponin:   Recent Labs  Lab 09/12/22 1311 09/12/22 1700  TROPONINIHS 183* 150*     Chemistry Recent Labs  Lab 09/18/22 0645 09/18/22 2143 09/19/22 0205 09/20/22 0239  NA 133*  --  136 137  K 3.6  --  4.2 4.4  CL 96*  --  101 102  CO2 22  --  24 22  GLUCOSE 152*  --  171* 116*  BUN 33*  --  34* 62*  CREATININE 1.43* 1.34* 1.33* 2.29*  CALCIUM 9.7  --  8.9 8.1*  GFRNONAA 50* 54* 55* 29*  ANIONGAP 15  --  11 13    Lipids  Recent Labs  Lab 09/16/22 0646  CHOL 51  TRIG 78  HDL 14*  LDLCALC 21  CHOLHDL 3.6    Hematology Recent Labs  Lab 09/18/22 2143 09/19/22 0205 09/20/22 0239  WBC 19.2* 16.9* 27.5*  RBC 3.12* 2.92* 3.11*  HGB 10.4* 9.5* 10.1*  HCT 31.5* 28.7* 30.4*  MCV 101.0* 98.3 97.7  MCH 33.3 32.5 32.5  MCHC 33.0 33.1 33.2  RDW 13.6 13.7 13.8  PLT 439* 421* 578*   Thyroid No results for input(s): "TSH", "FREET4" in the last 168 hours.  BNP No results for input(s): "BNP", "PROBNP" in the last 168 hours.   DDimer No results for input(s): "DDIMER" in the last 168 hours.   Radiology    No results found.  Cardiac Studies   Echo: 09/16/2022  IMPRESSIONS     1. Left ventricular ejection fraction, by estimation, is 45 to 50%. The  left ventricle has mildly decreased function. The left ventricle  demonstrates global hypokinesis. Left ventricular diastolic parameters are  consistent with Grade I diastolic  dysfunction (impaired  relaxation).   2. Right ventricular systolic function is normal. The right ventricular  size is normal. There is normal pulmonary artery systolic pressure.   3. The mitral valve is normal in structure. No evidence of mitral valve  regurgitation. No evidence of mitral stenosis. Moderate mitral annular  calcification.   4. The aortic valve is normal in structure. There is severe calcifcation  of the aortic valve. There is severe thickening of the aortic valve.  Aortic valve regurgitation is not visualized. Mild aortic valve stenosis.  Aortic valve mean gradient measures  13.0 mmHg. Aortic valve Vmax measures 2.39 m/s.   5. Aortic dilatation noted. There is mild dilatation of the ascending  aorta, measuring 41 mm.   6. The inferior vena cava is normal in size with greater than 50%  respiratory variability, suggesting right atrial pressure of 3 mmHg.   Comparison(s): No significant change from prior study. Prior images  reviewed side by side.   FINDINGS   Left Ventricle: Left ventricular ejection fraction, by estimation, is 45  to 50%. The left ventricle has mildly decreased function. The left  ventricle demonstrates global hypokinesis. The left ventricular internal  cavity size was normal in size. There is   no left ventricular hypertrophy. Left ventricular diastolic parameters  are consistent with Grade I diastolic dysfunction (impaired relaxation).   Right Ventricle: The right ventricular size is normal. No increase in  right ventricular wall thickness. Right ventricular systolic function is  normal. There is normal pulmonary artery systolic pressure. The tricuspid  regurgitant velocity is 1.89 m/s, and   with an assumed right atrial pressure of 3 mmHg, the estimated right  ventricular systolic pressure is 17.3 mmHg.   Left Atrium: Left atrial size was normal in size.   Right Atrium: Right atrial size was normal in size.   Pericardium: There is no evidence of pericardial  effusion.   Mitral Valve: The mitral valve is normal in structure. Moderate mitral  annular calcification. No evidence of mitral valve regurgitation. No  evidence of mitral valve stenosis.   Tricuspid Valve: The tricuspid valve is normal in structure. Tricuspid  valve regurgitation is not demonstrated. No evidence of tricuspid  stenosis.   Aortic Valve: The aortic valve is normal in structure. There is severe  calcifcation of the aortic valve. There is severe thickening of the aortic  valve. Aortic valve regurgitation is not visualized. Mild aortic stenosis  is present.  Aortic valve mean  gradient measures 13.0 mmHg. Aortic valve peak gradient measures 22.8  mmHg. Aortic valve area, by VTI measures 1.53 cm.   Pulmonic Valve: The pulmonic valve was normal in structure. Pulmonic valve  regurgitation is mild. No evidence of pulmonic stenosis.   Aorta: Aortic dilatation noted. There is mild dilatation of the ascending  aorta, measuring 41 mm.   Venous: The inferior vena cava is normal in size with greater than 50%  respiratory variability, suggesting right atrial pressure of 3 mmHg.   IAS/Shunts: No atrial level shunt detected by color flow Doppler.    Patient Profile     77 y.o. 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 Ella Jubilee.   Assessment & Plan    Unfortunately, the sequence of events since I saw him on Friday have changed dramatically.  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  CTA confirmed his ascending thoracic aortic dissection  as well as a descending penetrating ulcer that was not present several months ago.  He is on antibiotics and being followed by ID.  His white count continues to climb and is currently 27,000.  Serum creatinine continues to climb as well.  He was seen in consult by Dr. Leafy Ro, cardiothoracic surgery who felt that his age and comorbidities precluded surgical treatment and as result, he was made a DNR and palliative care apparently has been consulted.  I thoroughly endorsed this.  I been taking care of Mr. Garnier for over 30 years since 67.  I believe the best thing for him is to get him ASAP so that he can spend what time he has left with his family.  He and I discussed this and he agrees.     For questions or updates, please contact [facility] Please consult [email or web address] for contact info under        Signed, Nanetta Batty, MD  09/21/2022, 8:01 AM

## 2022-09-21 NOTE — TOC Progression Note (Signed)
Transition of Care [facility]) - Progression Note    Patient Details  Name: SANJIV PETIT MRN: [id number] Date of Birth: 1946/03/25  Transition of Care [facility]) CM/SW Contact  Harriet Masson, RN Phone Number: 09/21/2022, 9:42 AM  Clinical Narrative:     Spoke to son, Denyse Amass, who confirmed patient and family would like to speak to [facility] liaison. Vernell Leep with [employer] secure chat.  TOC following.   Expected Discharge Plan: Home w Home Health Services Barriers to Discharge: Continued Medical Work up  Expected Discharge Plan and Services In-house Referral: NA Discharge Planning Services: CM Consult Post Acute Care Choice: Home Health Living arrangements for the past 2 months: Single Family Home                           HH Arranged: RN, Disease Management, PT, OT HH Agency: [facility] Date [facility] Agency Contacted: 09/16/22 Time HH Agency Contacted: 1239 Representative spoke with at [facility] Agency: Kandee Keen   Social Determinants of Health (SDOH) Interventions SDOH Screenings   Food Insecurity: No Food Insecurity (09/12/2022)  Housing: Low Risk  (09/12/2022)  Transportation Needs: No Transportation Needs (09/12/2022)  Utilities: Not At Risk (09/12/2022)  Tobacco Use: Medium Risk (09/18/2022)    Readmission Risk Interventions     No data to display

## 2022-09-21 NOTE — Progress Notes (Signed)
PROGRESS NOTE    Johnny [name]  ZOX:[id number] DOB: [birth year] DOA: 09/12/2022 PCP: Daisy Floro, MD   77/M w CAD/CABG, abdominal aortic aneurysm, hypertension, dyslipidemia presented to the ED with dyspneayo male with the past medical history of coronary artery disease, sp CABG, abdominal aortic aneurysm, hypertension, obesity and dyslipidemia who presented with dyspnea.  Noted to be volume overloaded initially 5/20, responding to diuretics 5/21: Echo noted mild reduction in LV function with new wall motion abnormalities 5/22 positive blood culture for MSSA, ID and orthopedics consulted.  -5/23 right knee arthrocentesis consistent with septic joint. 5/24 right knee I&D  -5/24 TEE Noted aortic valve vegetation with possible aortic root abscess formation with dissection flap -CTA chest 5/25 noted type A aortic dissection with intramural hematoma along the lateral aspect of ascending aorta and contained perforation and descending aorta at the level of pulmonary vein versus deep ulcer -Case reviewed by CT surgery, interventional cardiology and heart failure team, did not offer surgery, mortality risk was felt to be too high -Palliative consulting  Subjective: -Hurting all over, waiting for his family  Assessment and Plan:  Aortic valve abscess, endocarditis, MSSA bacteremia MSSA right knee septic arthritis, prosthetic joint infection  -Sepsis POA -Right knee aspirate with MSSA, orthopedics consulting, underwent I&D 5/24 -TEE 5/24 noted mobile vegetation on aortic valve with surrounding abscess and dissection flap and aortic root -CT chest done 5/25 concerning for type a aortic root dissection with large penetrating ulcer versus contained descending aortic rupture -Case reviewed by CT surgery, interventional cardiology and heart failure team, did not offer surgery, mortality risk was felt to be too high -High risk of mortality -Currently on IV nafcillin, palliative care consulting,  hospice has been recommended, palliative to meet with family again today  Type A aortic dissection Descending aorta with large penetrating ulcer versus contained rupture -CT surgery consulting, see discussion above, high risk of mortality  Acute on chronic diastolic CHF (congestive heart failure) (HCC) -Echo with EF 55%, mildly reduced RV, left ventricular wall motion abnormalities -Now off diuretics  Coronary artery disease/CABG  CKD stage 3a, GFR 45-59 ml/min (HCC) AKI, hyponatremia.   Peripheral arterial disease (HCC) -had aortobifemoral bypass in 2005. Has severe SFA disease bilaterally with ABIs in 0,87 range.  -On aspirin and statin  Hyperlipidemia  Obstructive sleep apnea C pap.   Class 1 obesity Calculated BMI is 31.6  Malnutrition of moderate degree Continue nutritional supplements.  Continue thiamine.   Acute metabolic encephalopathy -Delirium in the setting of endocarditis, bacteremia - DVT prophylaxis: SCDs Code Status: Full code Family Communication: Updated son 5/25 Disposition Plan: Home with hospice versus residential  Consultants: Cards, T CTS, infectious disease   Procedures:   Antimicrobials:    Objective: Vitals:   09/20/22 2243 09/21/22 0507 09/21/22 0735 09/21/22 1128  BP: 120/63 137/68 130/64   Pulse: 70 68 68 (!) 56  Resp:  (!) 22 20   Temp:  97.9 F (36.6 C) 98.4 F (36.9 C) 98.3 F (36.8 C)  TempSrc:  Axillary Oral Oral  SpO2:      Weight:  96.3 kg    Height:        Intake/Output Summary (Last 24 hours) at 09/21/2022 1157 Last data filed at 09/21/2022 0914 Gross per 24 hour  Intake 507.32 ml  Output 875 ml  Net -367.68 ml   Filed Weights   09/19/22 0341 09/20/22 0406 09/21/22 0507  Weight: 94.6 kg 94.6 kg 96.3 kg    Examination:  Obese pleasant  male sitting up in bed, AAO x 2, mild confusion HEENT: No JVD CVS: S1-S2, regular rhythm, systolic murmur Lungs: Decreased breath sounds at the bases otherwise  clear Abdomen: Soft, nontender, bowel sounds present Extremities: 1+ edema    Data Reviewed:   CBC: Recent Labs  Lab 09/16/22 0204 09/17/22 0312 09/18/22 0645 09/18/22 2143 09/19/22 0205 09/20/22 0239  WBC 18.7* 17.8* 18.6* 19.2* 16.9* 27.5*  NEUTROABS 15.9* 15.0* 16.3*  --  15.3* 24.8*  HGB 10.0* 10.3* 10.6* 10.4* 9.5* 10.1*  HCT 30.3* 29.8* 32.8* 31.5* 28.7* 30.4*  MCV 99.7 97.7 98.8 101.0* 98.3 97.7  PLT 364 420* 471* 439* 421* 578*   Basic Metabolic Panel: Recent Labs  Lab 09/16/22 0204 09/17/22 0312 09/18/22 0645 09/18/22 2143 09/19/22 0205 09/20/22 0239  NA 133* 134* 133*  --  136 137  K 3.7 3.2* 3.6  --  4.2 4.4  CL 94* 97* 96*  --  101 102  CO2 27 22 22   --  24 22  GLUCOSE 142* 150* 152*  --  171* 116*  BUN 44* 40* 33*  --  34* 62*  CREATININE 1.90* 1.65* 1.43* 1.34* 1.33* 2.29*  CALCIUM 8.7* 9.2 9.7  --  8.9 8.1*   GFR: Estimated Creatinine Clearance: 29.4 mL/min (A) (by C-G formula based on SCr of 2.29 mg/dL (H)). Liver Function Tests: No results for input(s): "AST", "ALT", "ALKPHOS", "BILITOT", "PROT", "ALBUMIN" in the last 168 hours.  No results for input(s): "LIPASE", "AMYLASE" in the last 168 hours. No results for input(s): "AMMONIA" in the last 168 hours. Coagulation Profile: No results for input(s): "INR", "PROTIME" in the last 168 hours. Cardiac Enzymes: No results for input(s): "CKTOTAL", "CKMB", "CKMBINDEX", "TROPONINI" in the last 168 hours. BNP (last 3 results) No results for input(s): "PROBNP" in the last 8760 hours. HbA1C: No results for input(s): "HGBA1C" in the last 72 hours. CBG: Recent Labs  Lab 09/20/22 1236 09/20/22 1617 09/20/22 2140 09/21/22 0734 09/21/22 1128  GLUCAP 143* 203* 170* 126* 145*   Lipid Profile: No results for input(s): "CHOL", "HDL", "LDLCALC", "TRIG", "CHOLHDL", "LDLDIRECT" in the last 72 hours.  Thyroid Function Tests: No results for input(s): "TSH", "T4TOTAL", "FREET4", "T3FREE", "THYROIDAB" in the  last 72 hours. Anemia Panel: No results for input(s): "VITAMINB12", "FOLATE", "FERRITIN", "TIBC", "IRON", "RETICCTPCT" in the last 72 hours. Urine analysis:    Component Value Date/Time   COLORURINE YELLOW 09/13/2022 0110   APPEARANCEUR CLEAR 09/13/2022 0110   LABSPEC 1.014 09/13/2022 0110   PHURINE 5.0 09/13/2022 0110   GLUCOSEU NEGATIVE 09/13/2022 0110   HGBUR NEGATIVE 09/13/2022 0110   BILIRUBINUR NEGATIVE 09/13/2022 0110   KETONESUR 5 (A) 09/13/2022 0110   PROTEINUR 30 (A) 09/13/2022 0110   UROBILINOGEN 0.2 09/26/2011 2244   NITRITE NEGATIVE 09/13/2022 0110   LEUKOCYTESUR NEGATIVE 09/13/2022 0110   Sepsis Labs: @LABRCNTIP (procalcitonin:4,lacticidven:4)  ) Recent Results (from the past 240 hour(s))  SARS Coronavirus 2 by RT PCR (hospital order, performed in [facility] hospital lab) *cepheid single result test* Anterior Nasal Swab     Status: None   Collection Time: 09/12/22  2:14 PM   Specimen: Anterior Nasal Swab  Result Value Ref Range Status   SARS Coronavirus 2 by RT PCR NEGATIVE NEGATIVE Final    Comment: (NOTE) SARS-CoV-2 target nucleic acids are NOT DETECTED.  The SARS-CoV-2 RNA is generally detectable in upper and lower respiratory specimens during the acute phase of infection. The lowest concentration of SARS-CoV-2 viral copies this assay can detect is 250 copies /  mL. A negative result does not preclude SARS-CoV-2 infection and should not be used as the sole basis for treatment or other patient management decisions.  A negative result may occur with improper specimen collection / handling, submission of specimen other than nasopharyngeal swab, presence of viral mutation(s) within the areas targeted by this assay, and inadequate number of viral copies (<250 copies / mL). A negative result must be combined with clinical observations, patient history, and epidemiological information.  Fact Sheet for Patients:   RoadLapTop.co.za  Fact  Sheet for Healthcare Providers: http://kim-miller.com/  This test is not yet approved or  cleared by the [place] FDA and has been authorized for detection and/or diagnosis of SARS-CoV-2 by FDA under an Emergency Use Authorization (EUA).  This EUA will remain in effect (meaning this test can be used) for the duration of the COVID-19 declaration under Section 564(b)(1) of the Act, 21 U.S.C. section 360bbb-3(b)(1), unless the authorization is terminated or revoked sooner.  Performed at [facility], [address] [place], [place] [address]   MRSA Next Gen by PCR, Nasal     Status: None   Collection Time: 09/13/22  5:15 AM   Specimen: Nasal Mucosa; Nasal Swab  Result Value Ref Range Status   MRSA by PCR Next Gen NOT DETECTED NOT DETECTED Final    Comment: (NOTE) The GeneXpert MRSA Assay (FDA approved for NASAL specimens only), is one component of a comprehensive MRSA colonization surveillance program. It is not intended to diagnose MRSA infection nor to guide or monitor treatment for MRSA infections. Test performance is not FDA approved in patients less than 36 years old. Performed at [facility], [address] [place], [place] [address]   Culture, blood (Routine X 2) w Reflex to ID Panel     Status: Abnormal   Collection Time: 09/15/22  5:55 PM   Specimen: BLOOD RIGHT ARM  Result Value Ref Range Status   Specimen Description BLOOD RIGHT ARM  Final   Special Requests   Final    BOTTLES DRAWN AEROBIC AND ANAEROBIC Blood Culture adequate volume   Culture  Setup Time   Final    GRAM POSITIVE COCCI IN CLUSTERS IN BOTH AEROBIC AND ANAEROBIC BOTTLES CRITICAL VALUE NOTED.  VALUE IS CONSISTENT WITH PREVIOUSLY REPORTED AND CALLED VALUE.    Culture (A)  Final    STAPHYLOCOCCUS AUREUS SUSCEPTIBILITIES PERFORMED ON PREVIOUS CULTURE WITHIN THE LAST 5 DAYS. Performed at [facility], [address], [place], [place] [address]    Report  Status 09/18/2022 FINAL  Final  Culture, blood (Routine X 2) w Reflex to ID Panel     Status: Abnormal   Collection Time: 09/15/22  5:55 PM   Specimen: BLOOD RIGHT ARM  Result Value Ref Range Status   Specimen Description BLOOD RIGHT ARM  Final   Special Requests   Final    BOTTLES DRAWN AEROBIC AND ANAEROBIC Blood Culture adequate volume   Culture  Setup Time   Final    GRAM POSITIVE COCCI IN CLUSTERS IN BOTH AEROBIC AND ANAEROBIC BOTTLES CRITICAL RESULT CALLED TO, READ BACK BY AND VERIFIED WITH: A. PAYTES PHARMD, AT 9147 09/16/22 Renato Shin Performed at [facility], [address], [place], [place] [address]    Culture STAPHYLOCOCCUS AUREUS (A)  Final   Report Status 09/18/2022 FINAL  Final   Organism ID, Bacteria STAPHYLOCOCCUS AUREUS  Final      Susceptibility   Staphylococcus aureus - MIC*  CIPROFLOXACIN <=0.5 SENSITIVE Sensitive     ERYTHROMYCIN <=0.25 SENSITIVE Sensitive     GENTAMICIN <=0.5 SENSITIVE Sensitive     OXACILLIN 0.5 SENSITIVE Sensitive     TETRACYCLINE <=1 SENSITIVE Sensitive     VANCOMYCIN <=0.5 SENSITIVE Sensitive     TRIMETH/SULFA <=10 SENSITIVE Sensitive     CLINDAMYCIN <=0.25 SENSITIVE Sensitive     RIFAMPIN <=0.5 SENSITIVE Sensitive     Inducible Clindamycin NEGATIVE Sensitive     LINEZOLID 2 SENSITIVE Sensitive     * STAPHYLOCOCCUS AUREUS  Blood Culture ID Panel (Reflexed)     Status: Abnormal   Collection Time: 09/15/22  5:55 PM  Result Value Ref Range Status   Enterococcus faecalis NOT DETECTED NOT DETECTED Final   Enterococcus Faecium NOT DETECTED NOT DETECTED Final   Listeria monocytogenes NOT DETECTED NOT DETECTED Final   Staphylococcus species DETECTED (A) NOT DETECTED Final    Comment: CRITICAL RESULT CALLED TO, READ BACK BY AND VERIFIED WITH: A. PAYTES PHARMD, AT 1610 09/16/22 D. VANHOOK    Staphylococcus aureus (BCID) DETECTED (A) NOT DETECTED Final    Comment: CRITICAL RESULT CALLED TO, READ BACK BY AND VERIFIED WITH: A. PAYTES  PHARMD, AT 9604 09/16/22 D. VANHOOK    Staphylococcus epidermidis NOT DETECTED NOT DETECTED Final   Staphylococcus lugdunensis NOT DETECTED NOT DETECTED Final   Streptococcus species NOT DETECTED NOT DETECTED Final   Streptococcus agalactiae NOT DETECTED NOT DETECTED Final   Streptococcus pneumoniae NOT DETECTED NOT DETECTED Final   Streptococcus pyogenes NOT DETECTED NOT DETECTED Final   A.calcoaceticus-baumannii NOT DETECTED NOT DETECTED Final   Bacteroides fragilis NOT DETECTED NOT DETECTED Final   Enterobacterales NOT DETECTED NOT DETECTED Final   Enterobacter cloacae complex NOT DETECTED NOT DETECTED Final   Escherichia coli NOT DETECTED NOT DETECTED Final   Klebsiella aerogenes NOT DETECTED NOT DETECTED Final   Klebsiella oxytoca NOT DETECTED NOT DETECTED Final   Klebsiella pneumoniae NOT DETECTED NOT DETECTED Final   Proteus species NOT DETECTED NOT DETECTED Final   Salmonella species NOT DETECTED NOT DETECTED Final   Serratia marcescens NOT DETECTED NOT DETECTED Final   Haemophilus influenzae NOT DETECTED NOT DETECTED Final   Neisseria meningitidis NOT DETECTED NOT DETECTED Final   Pseudomonas aeruginosa NOT DETECTED NOT DETECTED Final   Stenotrophomonas maltophilia NOT DETECTED NOT DETECTED Final   Candida albicans NOT DETECTED NOT DETECTED Final   Candida auris NOT DETECTED NOT DETECTED Final   Candida glabrata NOT DETECTED NOT DETECTED Final   Candida krusei NOT DETECTED NOT DETECTED Final   Candida parapsilosis NOT DETECTED NOT DETECTED Final   Candida tropicalis NOT DETECTED NOT DETECTED Final   Cryptococcus neoformans/gattii NOT DETECTED NOT DETECTED Final   Meth resistant mecA/C and MREJ NOT DETECTED NOT DETECTED Final    Comment: Performed at [facility], [address], [place], [place] [address]  Body fluid culture w Gram Stain     Status: None   Collection Time: 09/16/22  3:59 PM   Specimen: Synovium; Body Fluid  Result Value Ref Range Status   Specimen  Description SYNOVIAL  Final   Special Requests RIGHT KNEE  Final   Gram Stain   Final    ABUNDANT WBC PRESENT, PREDOMINANTLY PMN FEW GRAM POSITIVE COCCI IN PAIRS CRITICAL RESULT CALLED TO, READ BACK BY AND VERIFIED WITH: RN PEYTON RIDGE ON 09/16/22 @ 1647 BY DRT Performed at [facility], [address], [place], [place] [address]    Culture ABUNDANT STAPHYLOCOCCUS AUREUS  Final   Report Status 09/18/2022 FINAL  Final   Organism ID, Bacteria STAPHYLOCOCCUS AUREUS  Final      Susceptibility   Staphylococcus aureus - MIC*    CIPROFLOXACIN <=0.5 SENSITIVE Sensitive     ERYTHROMYCIN <=0.25 SENSITIVE Sensitive     GENTAMICIN <=0.5 SENSITIVE Sensitive     OXACILLIN <=0.25 SENSITIVE Sensitive     TETRACYCLINE <=1 SENSITIVE Sensitive     VANCOMYCIN <=0.5 SENSITIVE Sensitive     TRIMETH/SULFA <=10 SENSITIVE Sensitive     CLINDAMYCIN <=0.25 SENSITIVE Sensitive     RIFAMPIN <=0.5 SENSITIVE Sensitive     Inducible Clindamycin NEGATIVE Sensitive     LINEZOLID 2 SENSITIVE Sensitive     * ABUNDANT STAPHYLOCOCCUS AUREUS  Culture, blood (Routine X 2) w Reflex to ID Panel     Status: None (Preliminary result)   Collection Time: 09/18/22  6:31 AM   Specimen: BLOOD  Result Value Ref Range Status   Specimen Description BLOOD RIGHT ANTECUBITAL  Final   Special Requests   Final    BOTTLES DRAWN AEROBIC AND ANAEROBIC Blood Culture results may not be optimal due to an inadequate volume of blood received in culture bottles   Culture   Final    NO GROWTH 3 DAYS Performed at [facility], [address] [place], [place] [address]    Report Status PENDING  Incomplete  Culture, blood (Routine X 2) w Reflex to ID Panel     Status: None (Preliminary result)   Collection Time: 09/18/22  6:42 AM   Specimen: BLOOD RIGHT ARM  Result Value Ref Range Status   Specimen Description BLOOD RIGHT ARM  Final   Special Requests   Final    BOTTLES DRAWN AEROBIC ONLY Blood Culture adequate volume   Culture    Final    NO GROWTH 3 DAYS Performed at [facility], [address] [place], [place] [address]    Report Status PENDING  Incomplete     Radiology Studies: No results found.   Scheduled Meds:  amLODipine  5 mg Oral Daily   aspirin EC  81 mg Oral Daily   Chlorhexidine Gluconate Cloth  6 each Topical Daily   diclofenac Sodium  4 g Topical QID   docusate sodium  100 mg Oral BID   ezetimibe  10 mg Oral Daily   famotidine  40 mg Oral Daily   feeding supplement  237 mL Oral TID BM   insulin aspart  0-5 Units Subcutaneous QHS   insulin aspart  0-9 Units Subcutaneous TID WC   metoprolol tartrate  25 mg Oral BID   multivitamin with minerals  1 tablet Oral Daily   simvastatin  40 mg Oral Daily   Continuous Infusions:  methocarbamol (ROBAXIN) IV     nafcillin 12 g in sodium chloride 0.9 % 500 mL continuous infusion 12 g (09/21/22 1008)     LOS: 8 days    Time spent:    Zannie Cove, MD [facility]   09/21/2022, 11:57 AM

## 2022-09-21 NOTE — Progress Notes (Signed)
   Subjective: 3 Days Post-Op Procedure(s) (LRB): IRRIGATION AND DEBRIDEMENT KNEE WITH POLY EXCHANGE (Right) TRANSESOPHAGEAL ECHOCARDIOGRAM (N/A) Patient reports pain as mild, well controlled with meds. Speaking with palliative care upon rounds. Knee is tender to touch, otherwise no complaints regarding the knee.  Objective: Vital signs in last 24 hours: Temp:  [97.7 F (36.5 C)-98.4 F (36.9 C)] 98.4 F (36.9 C) (05/27 0735) Pulse Rate:  [68-75] 68 (05/27 0735) Resp:  [20-22] 20 (05/27 0735) BP: (112-137)/(59-69) 130/64 (05/27 0735) SpO2:  [98 %] 98 % (05/26 1619) Weight:  [96.3 kg] 96.3 kg (05/27 0507)  Intake/Output from previous day:  Intake/Output Summary (Last 24 hours) at 09/21/2022 0825 Last data filed at 09/21/2022 0506 Gross per 24 hour  Intake 507.32 ml  Output 775 ml  Net -267.68 ml    Intake/Output this shift: No intake/output data recorded.  Labs: Recent Labs    09/18/22 2143 09/19/22 0205 09/20/22 0239  HGB 10.4* 9.5* 10.1*   Recent Labs    09/19/22 0205 09/20/22 0239  WBC 16.9* 27.5*  RBC 2.92* 3.11*  HCT 28.7* 30.4*  PLT 421* 578*   Recent Labs    09/19/22 0205 09/20/22 0239  NA 136 137  K 4.2 4.4  CL 101 102  CO2 24 22  BUN 34* 62*  CREATININE 1.33* 2.29*  GLUCOSE 171* 116*  CALCIUM 8.9 8.1*   No results for input(s): "LABPT", "INR" in the last 72 hours.  Exam: General - Patient is Alert and Oriented Extremity - Neurologically intact Neurovascular intact Sensation intact distally Dorsiflexion/Plantar flexion intact Dressing/Incision - clean, dry, no drainage. Aquacel in place with ace wrap on RLE Motor Function - intact, moving foot and toes well on exam.   Past Medical History:  Diagnosis Date   Abdominal aortic aneurysm (HCC)    Cardiac murmur    Coronary artery disease    Heart attack (HCC)    Hemorrhoids    Occasional bleeding    Hiatal hernia    History of myocardial infarction 1992   History of rheumatic fever     Hyperlipidemia    Hypertension    Obstructive sleep apnea    Osteoarthritis of knee    Right knee   Peripheral arterial disease (HCC)    status post aortobifemoral bypass grafting by Dr. Hart Rochester in 2005   Sleep apnea     Assessment/Plan: 3 Days Post-Op Procedure(s) (LRB): IRRIGATION AND DEBRIDEMENT KNEE WITH POLY EXCHANGE (Right) TRANSESOPHAGEAL ECHOCARDIOGRAM (N/A) Principal Problem:   Aortic dissection (HCC) Active Problems:   Coronary artery disease   Essential hypertension   Hyperlipidemia   Obstructive sleep apnea   Peripheral arterial disease (HCC)   Acute on chronic diastolic CHF (congestive heart failure) (HCC)   Class 1 obesity   Septic joint of right knee joint (HCC)   CKD stage 3a, GFR 45-59 ml/min (HCC)   Malnutrition of moderate degree   MSSA bacteremia   Acute metabolic encephalopathy   LV dysfunction   Aortic valve abscess   Dissection of aorta (HCC)   Septic embolism (HCC)  Estimated body mass index is 33.24 kg/m as calculated from the following:   Height as of this encounter: 5\' 7"  (1.702 m).   Weight as of this encounter: 96.3 kg.   DVT Prophylaxis - Aspirin Weight-bearing as tolerated to the RLE IV nafcillin  Disposition per medical team  Arther Abbott, PA-C Orthopedic Surgery [phone] 09/21/2022, 8:25 AM

## 2022-09-21 NOTE — Progress Notes (Addendum)
   Referral received to discuss hospice services with pt and family.  955am TC to the son left VM for return call to discuss hospice care options.   1055am I have reviewed chart and reached out to the son, Denyse Amass,  to meet. He is unsure when he will be at hospital today and will call me back to update for [place] to meet.   425pm no return call from [place]- Will follow up in am.   Norm Parcel RN [phone]

## 2022-09-21 NOTE — Progress Notes (Signed)
Mobility Specialist Progress Note:    09/21/22 1624  Mobility  Activity Transferred from chair to bed  Level of Assistance +2 (takes two people)  Assistive Device Stedy  Activity Response Tolerated fair  Mobility Referral Yes  $Mobility charge 1 Mobility  Mobility Specialist Start Time (ACUTE ONLY) 1600  Mobility Specialist Stop Time (ACUTE ONLY) 1622  Mobility Specialist Time Calculation (min) (ACUTE ONLY) 22 min   Pt received sitting in chair, requested to go back to bed d/t pain. Pt needed a ModA for STS and +2 to transfer. No c/o throughout transfer. Pt returned to bed w/ call bell in hand.  Thompson Grayer Mobility Specialist  Please contact vis Secure Chat or  Rehab Office [phone]

## 2022-09-21 NOTE — Progress Notes (Signed)
Physical Therapy Treatment Patient Details Name: KENTO SATTAR MRN: [id number] DOB: 1945/10/09 Today's Date: 09/21/2022   History of Present Illness Pt is a 77 y/o M presenting to ED on with hand and BLE edema, weakness, dyspnea with exertion,CXR revealing lower L atelectasiss, no pleural effusion or edema. Admitted for CHF exacerbation. R knee pain 5/20 found to have R knee effusion, s/p aspiration 5/22 PMH includes MI, PAD, aortic stenosis, CAD s/p CABG, HTN, AAA, hyperlipidemia, chronic pruritis/dermal hypersensitivity reaction, OSA.    PT Comments    Pt with fair tolerance to treatment today. Pt very lethargic today and required +2 Max A from bed to [facility] then to chair. Pt medical status has seriously declined since previous session and palliative is now following. Palliative and family now discussing possible comfort care. PT will continue to follow until decision is made.    Recommendations for follow up therapy are one component of a multi-disciplinary discharge planning process, led by the attending physician.  Recommendations may be updated based on patient status, additional functional criteria and insurance authorization.  Follow Up Recommendations       Assistance Recommended at Discharge Frequent or constant Supervision/Assistance  Patient can return home with the following Two people to help with walking and/or transfers;A lot of help with bathing/dressing/bathroom;Assistance with cooking/housework;Direct supervision/assist for medications management;Direct supervision/assist for financial management;Assist for transportation;Help with stairs or ramp for entrance   Equipment Recommendations  None recommended by PT    Recommendations for Other Services       Precautions / Restrictions Precautions Precautions: Fall Restrictions Weight Bearing Restrictions: No     Mobility  Bed Mobility Overal bed mobility: Needs Assistance Bed Mobility: Supine to Sit     Supine to  sit: +2 for physical assistance, Max assist     General bed mobility comments: Pt required assistance with trunk elevation and BLE management.    Transfers Overall transfer level: Needs assistance Equipment used: 2 person hand held assist Transfers: Sit to/from Stand, Bed to chair/wheelchair/BSC Sit to Stand: +2 physical assistance, Max assist   Step pivot transfers: +2 physical assistance, Max assist       General transfer comment: Pt required +2 Max A for all mobility. Pt demonstrated difficulty taking steps and needed assistance with guiding hips to [facility] and chair.    Ambulation/Gait               General Gait Details: Pt unable at this time   Stairs             Wheelchair Mobility    Modified Rankin (Stroke Patients Only)       Balance Overall balance assessment: Needs assistance Sitting-balance support: Feet supported Sitting balance-Leahy Scale: Good     Standing balance support: Bilateral upper extremity supported, During functional activity, Reliant on assistive device for balance Standing balance-Leahy Scale: Poor Standing balance comment: Reliant on +2 HHA                            Cognition Arousal/Alertness: Lethargic Behavior During Therapy: Flat affect Overall Cognitive Status: Difficult to assess                                 General Comments: Pt appeared to be very lethargic today and had a very flat affect. Pt was able to following commands consistently.        Exercises  General Comments General comments (skin integrity, edema, etc.): BP: 130/63 supine      Pertinent Vitals/Pain Pain Assessment Pain Assessment: No/denies pain    Home Living                          Prior Function            PT Goals (current goals can now be found in the care plan section) Progress towards PT goals: Not progressing toward goals - comment    Frequency    Min 1X/week      PT Plan  Discharge plan needs to be updated    Co-evaluation              AM-PAC PT "6 Clicks" Mobility   Outcome Measure  Help needed turning from your back to your side while in a flat bed without using bedrails?: A Lot Help needed moving from lying on your back to sitting on the side of a flat bed without using bedrails?: A Lot Help needed moving to and from a bed to a chair (including a wheelchair)?: A Lot Help needed standing up from a chair using your arms (e.g., wheelchair or bedside chair)?: A Lot Help needed to walk in hospital room?: Total Help needed climbing 3-5 steps with a railing? : Total 6 Click Score: 10    End of Session Equipment Utilized During Treatment: Gait belt Activity Tolerance: Patient limited by lethargy;Patient limited by fatigue Patient left: in chair;with call bell/phone within reach;with family/visitor present;with nursing/sitter in room Nutritional therapist) Nurse Communication: Mobility status PT Visit Diagnosis: Other abnormalities of gait and mobility (R26.89)     Time: 4098-1191 PT Time Calculation (min) (ACUTE ONLY): 26 min  Charges:  $Therapeutic Activity: 23-37 mins                     Shela Nevin, PT, DPT Acute Rehab Services [phone]    Gladys Damme 09/21/2022, 3:43 PM

## 2022-09-22 ENCOUNTER — Other Ambulatory Visit (HOSPITAL_COMMUNITY): Payer: Self-pay

## 2022-09-22 DIAGNOSIS — I71011 Dissection of aortic arch: Secondary | ICD-10-CM | POA: Diagnosis not present

## 2022-09-22 LAB — GLUCOSE, CAPILLARY
Glucose-Capillary: 150 mg/dL — ABNORMAL HIGH (ref 70–99)
Glucose-Capillary: 172 mg/dL — ABNORMAL HIGH (ref 70–99)
Glucose-Capillary: 157 mg/dL — ABNORMAL HIGH (ref 70–99)
Glucose-Capillary: 164 mg/dL — ABNORMAL HIGH (ref 70–99)

## 2022-09-22 MED ORDER — CEFADROXIL 500 MG PO CAPS
500.0000 mg | ORAL_CAPSULE | Freq: Two times a day (BID) | ORAL | 0 refills | Status: AC
  Filled 2022-09-22: qty 28, 14d supply, fill #0

## 2022-09-22 MED ORDER — CEFAZOLIN SODIUM-DEXTROSE 2-4 GM/100ML-% IV SOLN
2.0000 g | Freq: Two times a day (BID) | INTRAVENOUS | Status: DC
  Administered 2022-09-22 – 2022-09-24 (×3): 2 g via INTRAVENOUS
  Filled 2022-09-22 (×5): qty 100

## 2022-09-22 MED ORDER — MORPHINE SULFATE (CONCENTRATE) 10 MG /0.5 ML PO SOLN
5.0000 mg | ORAL | 0 refills | Status: AC | PRN
  Filled 2022-09-22: qty 30, 15d supply, fill #0

## 2022-09-22 NOTE — TOC Progression Note (Signed)
Transition of Care [facility]) - Progression Note    Patient Details  Name: Johnny [name] MRN: [id number] Date of Birth: 10-25-1945  Transition of [facility] CM/SW Contact  Graves-Bigelow, Lamar Laundry, RN Phone Number: 09/22/2022, 4:15 PM  Clinical Narrative:  Case Manager spoke with the patients son Denyse Amass and daughter Boyd Kerbs. Plan will now be to transition home on Wednesday due to arranging Hospice Services. Family had not made a decision regarding Hospice agency and DME has not arrived to the home. Son is agreeable to [facility] and they will provide Hospice Services. Son would like for hospital bed, wheelchair, overbed table and wheelchair to be delivered to the home. Agency will call the son for delivery time. Family is arranging personal care services for the patient and his spouse. Son had questions regarding a skilled nurse for foley care and wound care. Case Manager explained that this would be an additional cost out of pocket. Son is exploring the option of personal care. Case Manager will continue to follow for transition of care needs.     Expected Discharge Plan: Home w Hospice Care Barriers to Discharge: No Barriers Identified  Expected Discharge Plan and Services In-house Referral: NA Discharge Planning Services: CM Consult Post Acute Care Choice: Hospice Living arrangements for the past 2 months: Single Family Home Expected Discharge Date: 09/22/22               DME Arranged:  (DME via Hospice)         HH Arranged: RN HH Agency: [facility] Date HH Agency Contacted: 09/22/22 Time HH Agency Contacted: 1400 Representative spoke with at [facility] Agency: Cheri   Social Determinants of Health (SDOH) Interventions SDOH Screenings   Food Insecurity: No Food Insecurity (09/12/2022)  Housing: Low Risk  (09/12/2022)  Transportation Needs: No Transportation Needs (09/12/2022)  Utilities: Not At Risk (09/12/2022)  Tobacco Use: Medium Risk (09/18/2022)     Readmission Risk Interventions     No data to display

## 2022-09-22 NOTE — Progress Notes (Signed)
   Heart Failure Stewardship Pharmacist Progress Note   PCP: Daisy Floro, MD PCP-Cardiologist: Nanetta Batty, MD    HPI:  77 yo M with PMH of CAD s/p CABG, PAD, aortic stenosis, HTN, AAA, and HLD.   Presented to the ED on 5/18 with swelling to hands and lower extremities, generalized weakness, shortness of breath, and ongoing pruritus. BNP 131 and troponin peak 183. CXR without pleural effusion or edema. ECHO 5/20 showed LVEF reduced to 40-45%, regional wall motion abnormalities, and hypokinesis of left ventricular, basal-mid anteroseptal wall, inferoseptal wall and anterior wall.  Patient also reported R knee pain and appeared more confused. Found to have high grade MSSA bacteremia. ID consulted. Ortho recommending knee surgery - completed on 5/24.   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 Confirmed by CTA, TCTS consulted for type A dissection. Recommended heat CT and cath. Unfortunately, not a candidate for repair with high mortality risk. Palliative care consulted and recommending hospice.   Current HF Medications: Beta Blocker: metoprolol tartrate 25 mg BID  Prior to admission HF Medications: Diuretic: furosemide 20 mg daily ACE/ARB/ARNI: olmesartan 40 mg daily *also takes HCTZ 25 mg daily  Pertinent Lab Values: Serum creatinine 2.29, BUN 62, Potassium 4.4, Sodium 137, BNP 131.6, Magnesium 1.8, A1c 5.8   Vital Signs: Weight: 211 lbs (admission weight: 202 lbs) Blood pressure: 120/60s Heart rate: 60-70s  I/O: -0.5L yesterday; net -1.8L  Medication Assistance / Insurance Benefits Check: Does the patient have prescription insurance?  Yes Type of insurance plan: [facility] Medicare  Outpatient Pharmacy:  Prior to admission outpatient [facility] Is the patient willing to use [facility] TOC pharmacy at discharge? Yes Is the patient willing to transition their outpatient pharmacy to utilize a [facility] outpatient pharmacy?   No    Assessment: 1. Acute systolic CHF (LVEF 40-45%). NYHA class II symptoms. - Given events noted above, plan hospice at discharge - Does not appear volume overloaded on exam, no indication for diuretics   Plan: 1) Medication changes recommended at this time: - None, will sign off  Sharen Hones, PharmD, BCPS Heart Failure Stewardship Pharmacist Phone [phone]

## 2022-09-22 NOTE — Progress Notes (Signed)
Occupational Therapy Treatment Patient Details Name: Johnny [name] MRN: [id number] DOB: [birth year] Today's Date: 09/22/2022   History of present illness Pt is a 77 y/o M presenting to ED on with hand and BLE edema, weakness, dyspnea with exertion,CXR revealing lower L atelectasiss, no pleural effusion or edema. Admitted for CHF exacerbation. R knee pain 5/20 found to have R knee effusion, s/p aspiration 5/22 PMH includes MI, PAD, aortic stenosis, CAD s/p CABG, HTN, AAA, hyperlipidemia, chronic pruritis/dermal hypersensitivity reaction, OSA.   OT comments  Patient demonstrating gains with OT treatment. Patient agreeable to OT treatment and able to get to EOB with mod assist due to assistance needed with RLE. Patient performed stand from EOB to address balance and min assist with static standing. Patient returned to EOB and "plopped" back. Patient instructed to reach back before sitting and slowly descend. Patient able to perform transfer to recliner with RW and mod assist and assistance to lower to chair and instructions for hand placement, reach back. Patient to continue to be followed by acute OT to address transfers and self care.    Recommendations for follow up therapy are one component of a multi-disciplinary discharge planning process, led by the attending physician.  Recommendations may be updated based on patient status, additional functional criteria and insurance authorization.    Assistance Recommended at Discharge Intermittent Supervision/Assistance  Patient can return home with the following  Assistance with cooking/housework;Assist for transportation;Help with stairs or ramp for entrance;A lot of help with walking and/or transfers;A lot of help with bathing/dressing/bathroom   Equipment Recommendations  Tub/shower seat;Other (comment) (RW)    Recommendations for Other Services      Precautions / Restrictions Precautions Precautions: Fall Precaution Comments: no WB or motion  restrictions per ortho note 5/22 Restrictions Weight Bearing Restrictions: No       Mobility Bed Mobility Overal bed mobility: Needs Assistance Bed Mobility: Supine to Sit     Supine to sit: Mod assist     General bed mobility comments: increased time and assistance with RLE and scooting towards EOB    Transfers Overall transfer level: Needs assistance Equipment used: Rolling walker (2 wheels) Transfers: Sit to/from Stand, Bed to chair/wheelchair/BSC Sit to Stand: Mod assist     Step pivot transfers: Mod assist     General transfer comment: instructions for hand placment and safety. Patient required assistance to lower to chair due to difficulty with controlling momentum     Balance Overall balance assessment: Needs assistance Sitting-balance support: Feet supported Sitting balance-Leahy Scale: Good     Standing balance support: Bilateral upper extremity supported, During functional activity, Reliant on assistive device for balance Standing balance-Leahy Scale: Poor Standing balance comment: reliant on RW for support                           ADL either performed or assessed with clinical judgement   ADL Overall ADL's : Needs assistance/impaired                     Lower Body Dressing: Maximal assistance Lower Body Dressing Details (indicate cue type and reason): to donn socks               General ADL Comments: focused on bed mobility and transfers    Extremity/Trunk Assessment              Vision       Perception     Praxis  Cognition Arousal/Alertness: Awake/alert Behavior During Therapy: Flat affect Overall Cognitive Status: Difficult to assess Area of Impairment: Following commands, Safety/judgement, Awareness                       Following Commands: Follows one step commands with increased time Safety/Judgement: Decreased awareness of safety Awareness: Emergent   General Comments: alert and  agreeable to OOB transfer, required increased time to follow directions but able to follow        Exercises      Shoulder Instructions       General Comments      Pertinent Vitals/ Pain       Pain Assessment Pain Assessment: Faces Faces Pain Scale: Hurts even more Pain Location: R knee Pain Descriptors / Indicators: Discomfort, Grimacing, Moaning Pain Intervention(s): Limited activity within patient's tolerance, Monitored during session, Repositioned  Home Living                                          Prior Functioning/Environment              Frequency  Min 1X/week        Progress Toward Goals  OT Goals(current goals can now be found in the care plan section)  Progress towards OT goals: Progressing toward goals  Acute Rehab OT Goals Patient Stated Goal: none stated OT Goal Formulation: With patient Time For Goal Achievement: 09/27/22 Potential to Achieve Goals: Good ADL Goals Pt Will Perform Upper Body Dressing: with supervision;sitting Pt Will Perform Lower Body Dressing: with supervision;sitting/lateral leans;sit to/from stand Pt Will Transfer to Toilet: with supervision;ambulating;regular height toilet Pt Will Perform Tub/Shower Transfer: Tub transfer;Shower transfer;with supervision;ambulating;shower seat Additional ADL Goal #1: pt will verbalize 3 energy conservation strategies in prep for ADLs  Plan Frequency remains appropriate;Discharge plan needs to be updated    Co-evaluation                 AM-PAC OT "6 Clicks" Daily Activity     Outcome Measure   Help from another person eating meals?: None Help from another person taking care of personal grooming?: A Little Help from another person toileting, which includes using toliet, bedpan, or urinal?: A Little Help from another person bathing (including washing, rinsing, drying)?: A Little Help from another person to put on and taking off regular upper body clothing?: A  Little Help from another person to put on and taking off regular lower body clothing?: A Lot 6 Click Score: 18    End of Session Equipment Utilized During Treatment: Gait belt;Rolling walker (2 wheels)  OT Visit Diagnosis: Unsteadiness on feet (R26.81);Other abnormalities of gait and mobility (R26.89);Muscle weakness (generalized) (M62.81);History of falling (Z91.81)   Activity Tolerance Patient tolerated treatment well;Patient limited by pain   Patient Left in chair;with call bell/phone within reach;with chair alarm set;with family/visitor present   Nurse Communication Mobility status        Time: 1415-1436 OT Time Calculation (min): 21 min  Charges: OT General Charges $OT Visit: 1 Visit OT Treatments $Therapeutic Activity: 8-22 mins  Alfonse Flavors, OTA Acute Rehabilitation Services  Office [phone]   Dewain Penning 09/22/2022, 2:46 PM

## 2022-09-22 NOTE — Plan of Care (Signed)
Pt to discharge home with hospice/family. A&OX4. BP 128/71 (BP Location: Right Wrist)   Pulse 69   Temp 98.4 F (36.9 C) (Oral)   Resp 19   Ht 5\' 7"  (1.702 m)   Wt 95.7 kg   SpO2 98%   BMI 33.05 kg/m  AVS reviewed with questions answered, belongings returned. No further needs voiced at this time. Reva Bores 09/22/22 10:18 AM

## 2022-09-22 NOTE — Progress Notes (Signed)
Subjective: 4 Days Post-Op Procedure(s) (LRB): IRRIGATION AND DEBRIDEMENT KNEE WITH POLY EXCHANGE (Right) TRANSESOPHAGEAL ECHOCARDIOGRAM (N/A) Patient reports pain as mild.   Events of weekend noted. 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 Now on palliative care  Objective: Vital signs in last 24 hours: Temp:  [98.3 F (36.8 C)-99 F (37.2 C)] 98.3 F (36.8 C) (05/28 0416) Pulse Rate:  [56-74] 68 (05/28 0416) Resp:  [19-22] 19 (05/28 0416) BP: (123-149)/(60-93) 149/73 (05/28 0416) SpO2:  [95 %-98 %] 96 % (05/28 0416) Weight:  [95.7 kg] 95.7 kg (05/28 0416)  Intake/Output from previous day: 05/27 0701 - 05/28 0700 In: 600 [P.O.:600] Out: 1800 [Urine:1800] Intake/Output this shift: No intake/output data recorded.  Recent Labs    09/20/22 0239  HGB 10.1*   Recent Labs    09/20/22 0239  WBC 27.5*  RBC 3.11*  HCT 30.4*  PLT 578*   Recent Labs    09/20/22 0239  NA 137  K 4.4  CL 102  CO2 22  BUN 62*  CREATININE 2.29*  GLUCOSE 116*  CALCIUM 8.1*   No results for input(s): "LABPT", "INR" in the last 72 hours.  Incision: dressing C/D/I No cellulitis present Compartment soft Swelling decreased significantly compared to pre-op No warmth or erythema about right knee   Assessment/Plan: 4 Days Post-Op Procedure(s) (LRB): IRRIGATION AND DEBRIDEMENT KNEE WITH POLY EXCHANGE (Right) TRANSESOPHAGEAL ECHOCARDIOGRAM (N/A) Up with therapy Continue IV antibiotics Palliative care Will sign off Follow up in office in 2 weeks for staple removal   Homero Fellers Zekiah Coen 09/22/2022, 7:12 AM

## 2022-09-22 NOTE — Progress Notes (Signed)
Heart Failure Navigator Progress Note  Assessed for Heart & Vascular TOC clinic readiness.  Patient does not meet criteria due to EF 40-45%, Palliative consulted recommending Hospice. .   Navigator will sign off at this time.   Rhae Hammock, BSN, Scientist, clinical (histocompatibility and immunogenetics) Only

## 2022-09-22 NOTE — Discharge Summary (Signed)
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   Septic joint of right knee joint (HCC)   Essential hypertension   Acute on chronic diastolic CHF (congestive heart failure) (HCC)   Coronary artery disease   CKD stage 3a, GFR 45-59 ml/min (HCC)   Hyperlipidemia   Peripheral arterial disease (HCC)   Obstructive sleep apnea   Class 1 obesity   Malnutrition of moderate degree   Acute metabolic encephalopathy   MSSA bacteremia   LV dysfunction   Aortic valve abscess   Dissection of aorta (HCC)   Septic embolism (HCC)   Discharge Condition: Guarded  Diet recommendation: Comfort feeds  Filed Weights   09/20/22 0406 09/21/22 0507 09/22/22 0416  Weight: 94.6 kg 96.3 kg 95.7 kg    History of present illness:  76/M w CAD/CABG, abdominal aortic aneurysm, hypertension, dyslipidemia presented to the ED with dyspneayo male with the past medical history of coronary artery disease, sp CABG, abdominal aortic aneurysm, hypertension, obesity and dyslipidemia who presented with dyspnea.  Noted to be volume overloaded initially 5/20, responding to diuretics 5/21: Echo noted mild reduction in LV function with new wall motion abnormalities 5/22 positive blood culture for MSSA, ID and orthopedics consulted.  -5/23 right knee arthrocentesis consistent with septic joint. 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, prosthetic joint infection  -Sepsis POA -Right knee aspirate with MSSA, orthopedics consulting, underwent I&D 5/24 -TEE 5/24 noted mobile vegetation on aortic valve with surrounding abscess and dissection flap and aortic root -CT chest done 5/25 concerning for type a aortic root dissection with large penetrating ulcer versus contained descending aortic rupture -Case reviewed by CT surgery, interventional cardiology and heart failure team, did not offer surgery, mortality risk was felt to be too high -Seen by palliative care consultation, he will be discharged home with hospice services today    Type A aortic dissection Descending aorta with large penetrating ulcer versus contained rupture -CT surgery consulting, see discussion above, high risk of mortality   Acute on chronic diastolic CHF (congestive heart failure) (HCC) -Echo with EF 55%, mildly reduced RV, left ventricular wall motion abnormalities   Coronary artery disease/CABG   CKD stage 3a, GFR 45-59 ml/min (HCC) AKI, hyponatremia.    Peripheral arterial disease (HCC) -had aortobifemoral bypass in 2005. Has severe SFA disease bilaterally with ABIs in 0,87 range.  -On aspirin and statin   Hyperlipidemia   Obstructive sleep apnea C pap.    Class 1 obesity Calculated BMI is 31.6   Malnutrition of moderate degree Continue nutritional supplements.  Continue thiamine.    Acute metabolic encephalopathy -Delirium in the setting of endocarditis, bacteremia  Discharge Exam: Vitals:   09/22/22 0416 09/22/22  0753  BP: (!) 149/73 128/71  Pulse: 68 69  Resp: 19 19  Temp: 98.3 F (36.8 C) 98.4 F (36.9 C)  SpO2: 96% 98%    Gen: Awake, Alert, Oriented X 2, some confusion HEENT: Positive J VD Lungs: Decreased breath sound at the bases CVS: S1S2/RRR, systolic murmur Abd: soft, Non tender, non distended, BS present Extremities: 1+ edema, right knee with dressing Skin: no new rashes on exposed skin   Discharge Instructions   Discharge Instructions     Diet general   Complete by: As directed    Discharge wound care:   Complete by: As directed    routine   Increase activity slowly   Complete by: As directed       Allergies as of 09/22/2022       Reactions   Statins Other (See Comments)   Intolerance        Medication List     STOP taking these medications    amLODipine 10 MG tablet Commonly known as: NORVASC   ezetimibe 10 MG tablet Commonly known as: ZETIA   famotidine 40 MG tablet Commonly known as: PEPCID   hydrochlorothiazide 25 MG tablet Commonly known as: HYDRODIURIL   metFORMIN 500 MG tablet Commonly known as: GLUCOPHAGE   olmesartan 40 MG tablet Commonly known as: BENICAR   simvastatin 20 MG tablet Commonly known as: ZOCOR   triamcinolone cream 0.1 % Commonly known as: KENALOG   Vitamin B12 1000 MCG Tbcr       TAKE these medications    doxepin 25 MG capsule Commonly known as: SINEQUAN Take 25 mg by mouth at bedtime.   furosemide 20 MG tablet Commonly known as: LASIX Take 20 mg by mouth daily.   gabapentin 100 MG capsule Commonly known as: NEURONTIN Take 1 capsule (100 mg total) by mouth daily.   latanoprost 0.005 % ophthalmic solution Commonly known as: XALATAN Place 1 drop into both eyes at bedtime.   morphine CONCENTRATE 10 mg / 0.5 ml concentrated solution Take 0.25 mLs (5 mg total) by mouth every 3 (three) hours as needed for severe pain, shortness of breath or moderate pain.               Discharge Care Instructions  (From admission, onward)           Start     Ordered   09/22/22 0000  Discharge wound care:       Comments:  routine   09/22/22 0836           Allergies  Allergen Reactions   Statins Other (See Comments)    Intolerance     Follow-up Information     Care, [facility] Follow up.   Specialty: Home Health Services Why: Registered Nurse, Physical and Occupational Therapy-office to call with visit times. Contact information: [address] [place] [address] [phone]         Ollen Gross, MD. Schedule an appointment as soon as possible for a visit in 2 week(s).   Specialty: Orthopedic Surgery Contact information: [address] [place] [address] [phone]                  The results of significant diagnostics from this hospitalization (including imaging, microbiology, ancillary and laboratory) are listed below for reference.    Significant Diagnostic Studies: CT ANGIO CHEST AORTA W/CM & OR WO/CM  Result Date: 09/19/2022 CLINICAL DATA:  Aortic infection or inflammation. EXAM: CT ANGIOGRAPHY  CHEST WITH CONTRAST TECHNIQUE: Multidetector CT imaging of the chest was performed using the standard protocol during bolus administration of intravenous contrast. Multiplanar CT image reconstructions and MIPs were obtained to evaluate the vascular anatomy. RADIATION DOSE REDUCTION: This exam was performed according to the departmental dose-optimization program which includes automated exposure control, adjustment of the mA and/or kV according to patient size and/or use of iterative reconstruction technique. 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 No dissection propagation into the descending aorta or the arch vessels is seen. At the level of the left inferior pulmonary vein, at the contours of the descending aorta are markedly irregular and there is surrounding  intermediate density material. This is concerning for a contained aortic rupture or large penetrating atheromatous ulcer. This appearance is new compared to 07/21/2022. Mediastinum/Nodes: No mediastinal, hilar or axillary lymphadenopathy. Normal visualized thyroid. Thoracic esophageal course is normal. Lungs/Pleura: There are bilateral pleural effusions. Upper Abdomen: Contrast bolus timing is not optimized for evaluation of the abdominal organs. The visualized portions of the organs of the upper abdomen are normal. Musculoskeletal: No chest wall abnormality. No bony spinal canal stenosis. Review of the MIP images confirms the above findings. IMPRESSION: 1. 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 2. 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 Vascular surgery consultation recommended. 3. Bilateral small pleural effusions. Critical Value/emergent results were called by telephone at the time of interpretation on 09/19/2022 at 2:08 am to Dr. Imogene Burn, who verbally acknowledged these results. Electronically Signed   By: Deatra Robinson M.D.   On: 09/19/2022 02:09   EP STUDY  Result Date: 09/18/2022 See surgical note for result.  ECHO TEE  Result Date: 09/18/2022    TRANSESOPHOGEAL ECHO REPORT   Patient Name:   Johnny [name] Date of Exam: 09/18/2022 Medical Rec #:  [id number]       Height:       67.0 in Accession #:    [id number]      Weight:       207.0 lb Date of Birth:  1946/02/10       BSA:          2.052 m Patient Age:    18 years        BP:           134/76 mmHg Patient Gender: M               HR:           85 bpm. Exam Location:  Inpatient Procedure: Transesophageal Echo, Cardiac Doppler and Color Doppler Indications:     CHF  History:         Patient has prior history of Echocardiogram examinations, most                  recent 09/16/2022. Aortic Valve Disease; Risk                   Factors:Hypertension.  Sonographer:     Dondra Prader RVT Referring Phys:  [id number] Lillia Abed B ROBERTS Diagnosing Phys: Arvilla Meres MD PROCEDURE: The transesophogeal probe was passed without difficulty through the esophogus of the patient. Sedation performed by different physician. The patient developed no complications during the procedure.  IMPRESSIONS  1. Left ventricular ejection fraction, by estimation, is 55 to 60%. The left ventricle has normal function. The left ventricle demonstrates regional wall motion abnormalities (see scoring diagram/findings for description).  2. Right ventricular systolic function is mildly reduced. The right ventricular size is normal.  3. LAA not visulaized. No left atrial/left atrial appendage thrombus was detected.  4. No mitral valve vegetation. The mitral valve is normal in structure. Mild mitral valve regurgitation.  5. No tricuspid valve vegetation.  6. There is a small mobile vegetation on the aortic side of the valve with what appears to be surrounding abscess formation and dissection flap in the aortic root. The aortic valve is tricuspid. There is mild calcification of the aortic valve. Aortic valve regurgitation is mild. No aortic stenosis is present.  7. No pulmonic valve vegetation.  8. There is edema in the tissues surrounding the aortic valve as well as a dissection flap in the aortic root with what appears to be abscess formation or a false lumen. Given clinical picture I have high suspicion for abscess formation. Recommend CT imaging for further evaluation. D/w ID team. Aortic dilatation noted. There is mild dilatation of the aortic root, measuring 41 mm. FINDINGS  Left Ventricle: Left ventricular ejection fraction, by estimation, is 55 to 60%. The left ventricle has normal function. The left ventricle demonstrates regional wall motion abnormalities. The left ventricular internal cavity size was normal in size.  LV Wall Scoring: The mid inferoseptal  segment and apical septal segment are hypokinetic. Right Ventricle: The right ventricular size is normal. No increase in right ventricular wall thickness. Right ventricular systolic function is mildly reduced. Left Atrium: LAA not visulaized. Left atrial size was normal in size. No left atrial/left atrial appendage thrombus was detected. Right Atrium: Right atrial size was normal in size. Pericardium: There is no evidence of pericardial effusion. Mitral Valve: No mitral valve vegetation. The mitral valve is normal in structure. Mild mitral valve regurgitation. Tricuspid Valve: No tricuspid valve vegetation. The tricuspid valve is normal in structure. Tricuspid valve regurgitation is trivial. Aortic Valve: There is a small mobile vegetation on the aortic side of the valve with what appears to be surrounding abscess formation and dissection flap in the aortic root. The aortic valve is tricuspid. There is mild calcification of the aortic valve.  Aortic valve regurgitation is mild. No aortic stenosis is present. Pulmonic Valve: No pulmonic valve vegetation. The pulmonic valve was normal in structure. Pulmonic valve regurgitation is trivial. Aorta: There is edema in the tissues surrounding the aortic valve as well as a dissection flap in the aortic root with what appears to be abscess formation or a false lumen. Given clinical picture I have high suspicion for abscess formation. Recommend CT  imaging for further evaluation. D/w ID team. Aortic dilatation noted. There is mild dilatation of the aortic root, measuring 41 mm. IAS/Shunts: The interatrial septum appears to be lipomatous. No atrial level shunt detected by color flow Doppler. Arvilla Meres MD Electronically signed by Arvilla Meres MD Signature Date/Time: 09/18/2022/5:51:51 PM    Final (Updated)    ECHOCARDIOGRAM COMPLETE  Result Date: 09/16/2022    ECHOCARDIOGRAM REPORT   Patient Name:   Johnny [name] Date of Exam: 09/16/2022 Medical Rec #:  [id number]        Height:       67.0 in Accession #:    [id number]      Weight:       195.6 lb Date of Birth:  August 14, 1945       BSA:          2.003 m Patient Age:    76 years        BP:           121/64 mmHg Patient Gender: M               HR:           73 bpm. Exam Location:  Inpatient Procedure: 2D Echo, Color Doppler and Cardiac Doppler Indications:    congestive heart failure  History:        Patient has prior history of Echocardiogram examinations, most                 recent 09/14/2022. CAD, PAD and chronic kidney disease,                 Signs/Symptoms:Bacteremia, Dyspnea and Edema; Risk Factors:Sleep                 Apnea, Hypertension and Dyslipidemia.  Sonographer:    Delcie Roch RDCS Referring Phys: [id number] MAURICIO DANIEL ARRIEN IMPRESSIONS  1. Left ventricular ejection fraction, by estimation, is 45 to 50%. The left ventricle has mildly decreased function. The left ventricle demonstrates global hypokinesis. Left ventricular diastolic parameters are consistent with Grade I diastolic dysfunction (impaired relaxation).  2. Right ventricular systolic function is normal. The right ventricular size is normal. There is normal pulmonary artery systolic pressure.  3. The mitral valve is normal in structure. No evidence of mitral valve regurgitation. No evidence of mitral stenosis. Moderate mitral annular calcification.  4. The aortic valve is normal in structure. There is severe calcifcation of the aortic valve. There is severe thickening of the aortic valve. Aortic valve regurgitation is not visualized. Mild aortic valve stenosis. Aortic valve mean gradient measures 13.0 mmHg. Aortic valve Vmax measures 2.39 m/s.  5. Aortic dilatation noted. There is mild dilatation of the ascending aorta, measuring 41 mm.  6. The inferior vena cava is normal in size with greater than 50% respiratory variability, suggesting right atrial pressure of 3 mmHg. Comparison(s): No significant change from prior study. Prior images reviewed  side by side. FINDINGS  Left Ventricle: Left ventricular ejection fraction, by estimation, is 45 to 50%. The left ventricle has mildly decreased function. The left ventricle demonstrates global hypokinesis. The left ventricular internal cavity size was normal in size. There is  no left ventricular hypertrophy. Left ventricular diastolic parameters are consistent with Grade I diastolic dysfunction (impaired relaxation). Right Ventricle: The right ventricular size is normal. No increase in right ventricular wall thickness. Right ventricular systolic function is normal. There is normal pulmonary artery systolic pressure. The tricuspid regurgitant velocity is 1.89 m/s, and  with an assumed right atrial pressure of 3 mmHg, the estimated right ventricular systolic pressure is 17.3 mmHg. Left Atrium: Left atrial size was normal in size. Right Atrium: Right atrial size was normal in size. Pericardium: There is no evidence of pericardial effusion. Mitral Valve: The mitral valve is normal in structure. Moderate mitral annular calcification. No evidence of mitral valve regurgitation. No evidence of mitral valve stenosis. Tricuspid Valve: The tricuspid valve is normal in structure. Tricuspid valve regurgitation is not demonstrated. No evidence of tricuspid stenosis. Aortic Valve: The aortic valve is normal in structure. There is severe calcifcation of the aortic valve. There is severe thickening of the aortic valve. Aortic valve regurgitation is not visualized. Mild aortic stenosis is present. Aortic valve mean gradient  measures 13.0 mmHg. Aortic valve peak gradient measures 22.8 mmHg. Aortic valve area, by VTI measures 1.53 cm. Pulmonic Valve: The pulmonic valve was normal in structure. Pulmonic valve regurgitation is mild. No evidence of pulmonic stenosis. Aorta: Aortic dilatation noted. There is mild dilatation of the ascending aorta, measuring 41 mm. Venous: The inferior vena cava is normal in size with greater than 50%  respiratory variability, suggesting right atrial pressure of 3 mmHg. IAS/Shunts: No atrial level shunt detected by color flow Doppler.  LEFT VENTRICLE PLAX 2D LVIDd:         4.70 cm      Diastology LVIDs:         3.60 cm      LV e' medial:    7.30 cm/s LV PW:         1.20 cm      LV E/e' medial:  10.7 LV IVS:        1.20 cm      LV e' lateral:   8.86 cm/s LVOT diam:     2.10 cm      LV E/e' lateral: 8.8 LV SV:         69 LV SV Index:   34 LVOT Area:     3.46 cm  LV Volumes (MOD) LV vol d, MOD A2C: 120.0 ml LV vol s, MOD A2C: 61.7 ml LV SV MOD A2C:     58.3 ml RIGHT VENTRICLE             IVC RV Basal diam:  2.50 cm     IVC diam: 1.50 cm RV S prime:     12.30 cm/s TAPSE (M-mode): 1.0 cm LEFT ATRIUM             Index        RIGHT ATRIUM           Index LA diam:        3.60 cm 1.80 cm/m   RA Area:     13.60 cm LA Vol (A2C):   37.8 ml 18.87 ml/m  RA Volume:   28.60 ml  14.28 ml/m LA Vol (A4C):   36.1 ml 18.02 ml/m LA Biplane Vol: 38.3 ml 19.12 ml/m  AORTIC VALVE AV Area (Vmax):    1.52 cm AV Area (Vmean):   1.46 cm AV Area (VTI):     1.53 cm AV Vmax:           239.00 cm/s AV Vmean:          165.000 cm/s AV VTI:            0.451 m AV Peak Grad:      22.8 mmHg AV Mean Grad:      13.0 mmHg LVOT Vmax:         105.00 cm/s LVOT Vmean:        69.500 cm/s LVOT VTI:          0.200 m LVOT/AV VTI ratio: 0.44  AORTA Ao Root diam: 3.60 cm Ao Asc diam:  4.10 cm MITRAL VALVE               TRICUSPID VALVE MV Area (PHT): 3.53 cm    TR Peak grad:   14.3 mmHg MV Decel Time: 215 msec    TR Vmax:        189.00 cm/s MV E velocity: 77.80 cm/s MV A velocity: 90.00 cm/s  SHUNTS MV E/A ratio:  0.86        Systemic  VTI:  0.20 m                            Systemic Diam: 2.10 cm Donato Schultz MD Electronically signed by Donato Schultz MD Signature Date/Time: 09/16/2022/3:42:34 PM    Final    DG Knee 1-2 Views Right  Result Date: 09/15/2022 CLINICAL DATA:  Right medial knee pain for 2 days EXAM: RIGHT KNEE - 1-2 VIEW COMPARISON:  01/09/2004  FINDINGS: Heavy atherosclerotic arterial vascular calcification. Small knee effusion. Lucency underlying the tibial tray along the medial plateau on the frontal projection (zones 1 and 2) and to a lesser extent along the anterior plateau of the tibial component on the lateral projection (zone 1) and along the posterior margin of the posterior tibial plateau on the lateral projection (zone 2). This can have an association with aseptic loosening. IMPRESSION: 1. Lucency underlying the portions of the tibial tray, which can have an association with aseptic loosening. 2. Small knee effusion. 3. Heavy atherosclerotic arterial vascular calcification. Electronically Signed   By: Gaylyn Rong M.D.   On: 09/15/2022 15:44   ECHOCARDIOGRAM LIMITED  Result Date: 09/14/2022    ECHOCARDIOGRAM LIMITED REPORT   Patient Name:   Johnny [name] Date of Exam: 09/14/2022 Medical Rec #:  [id number]       Height:       67.0 in Accession #:    [id number]      Weight:       200.0 lb Date of Birth:  1946/03/27       BSA:          2.022 m Patient Age:    26 years        BP:           109/68 mmHg Patient Gender: M               HR:           86 bpm. Exam Location:  Inpatient Procedure: Limited Echo Indications:    Elevated Troponin  History:        Patient has prior history of Echocardiogram examinations, most                 recent 05/25/2022. CHF, CAD, PAD and OSA, CKD; Risk                 Factors:Hypertension and Dyslipidemia.  Sonographer:    Milbert Coulter Referring Phys: [id number] MAURICIO DANIEL ARRIEN IMPRESSIONS  1. Left ventricular ejection fraction, by estimation, is 40 to 45%. The left ventricle has mildly decreased function. The left ventricle demonstrates regional wall motion abnormalities (see scoring diagram/findings for description). There is hypokinesis  of the left ventricular, basal-mid anteroseptal wall, inferoseptal wall and anterior wall. Comparison(s): Changes from prior study are noted.  Conclusion(s)/Recommendation(s): New wall motion abnormalities in the basal to mid septal and anterior walls, concerning for ischemia. Findings communicated with Dr. Ella Jubilee. FINDINGS  Left Ventricle: Left ventricular ejection fraction, by estimation, is 40 to 45%. The left ventricle has mildly decreased function. The left ventricle demonstrates regional wall motion abnormalities. LEFT VENTRICLE PLAX 2D LVIDd:         5.30 cm LVIDs:         4.60 cm LV PW:         1.20 cm LV IVS:        1.00 cm   AORTA Ao Root diam: 4.20 cm Jodelle Red MD Electronically signed by  Jodelle Red MD Signature Date/Time: 09/14/2022/6:47:35 PM    Final    DG Chest Port 1 View  Result Date: 09/13/2022 CLINICAL DATA:  Fever EXAM: PORTABLE CHEST 1 VIEW COMPARISON:  09/12/2022, 07/21/2022 FINDINGS: Stable cardiomegaly status post sternotomy and CABG. Prominent epicardial fat pads. No focal airspace consolidation, pleural effusion, or pneumothorax. IMPRESSION: No active disease. Electronically Signed   By: Duanne Guess D.O.   On: 09/13/2022 11:05   DG Chest 2 View  Result Date: 09/12/2022 CLINICAL DATA:  Lower extremity swelling, pruritis EXAM: CHEST - 2 VIEW COMPARISON:  09/12/2022 FINDINGS: Frontal and lateral views of the chest demonstrates stable enlargement of the cardiac silhouette. There is minimal bibasilar subsegmental atelectasis or scarring. A prominent epicardial fat pad along the left heart border on prior CT likely accounts for the density seen on earlier x-ray. No acute airspace disease, effusion, or pneumothorax. No acute bony abnormalities. Postsurgical changes from median sternotomy. IMPRESSION: 1. Chronic bibasilar scarring or atelectasis. No acute airspace disease. Electronically Signed   By: Sharlet Salina M.D.   On: 09/12/2022 15:10   DG Chest Port 1 View  Result Date: 09/12/2022 CLINICAL DATA:  Shortness of breath EXAM: PORTABLE CHEST 1 VIEW COMPARISON:  11/03/2010 chest radiograph  FINDINGS: Telemetry leads overlie the chest. Cardiomegaly and CABG changes again noted. Ill-defined opacity overlying the LATERAL LEFT lung base noted and may represent atelectasis, scarring or nodule. No definite pleural effusion or pneumothorax noted. No acute bony abnormalities are identified. IMPRESSION: Ill-defined opacity overlying the LATERAL LEFT lung base which may represent atelectasis, scarring or nodule. Recommend PA and LATERAL chest radiograph clinically able Electronically Signed   By: Harmon Pier M.D.   On: 09/12/2022 13:57    Microbiology: Recent Results (from the past 240 hour(s))  SARS Coronavirus 2 by RT PCR (hospital order, performed in [facility] hospital lab) *cepheid single result test* Anterior Nasal Swab     Status: None   Collection Time: 09/12/22  2:14 PM   Specimen: Anterior Nasal Swab  Result Value Ref Range Status   SARS Coronavirus 2 by RT PCR NEGATIVE NEGATIVE Final    Comment: (NOTE) SARS-CoV-2 target nucleic acids are NOT DETECTED.  The SARS-CoV-2 RNA is generally detectable in upper and lower respiratory specimens during the acute phase of infection. The lowest concentration of SARS-CoV-2 viral copies this assay can detect is 250 copies / mL. A negative result does not preclude SARS-CoV-2 infection and should not be used as the sole basis for treatment or other patient management decisions.  A negative result may occur with improper specimen collection / handling, submission of specimen other than nasopharyngeal swab, presence of viral mutation(s) within the areas targeted by this assay, and inadequate number of viral copies (<250 copies / mL). A negative result must be combined with clinical observations, patient history, and epidemiological information.  Fact Sheet for Patients:   RoadLapTop.co.za  Fact Sheet for Healthcare Providers: http://kim-miller.com/  This test is not yet approved or  cleared by  the [place] FDA and has been authorized for detection and/or diagnosis of SARS-CoV-2 by FDA under an Emergency Use Authorization (EUA).  This EUA will remain in effect (meaning this test can be used) for the duration of the COVID-19 declaration under Section 564(b)(1) of the Act, 21 U.S.C. section 360bbb-3(b)(1), unless the authorization is terminated or revoked sooner.  Performed at [facility], [address] [place], [place] [address]   MRSA Next Gen by PCR, Nasal  Status: None   Collection Time: 09/13/22  5:15 AM   Specimen: Nasal Mucosa; Nasal Swab  Result Value Ref Range Status   MRSA by PCR Next Gen NOT DETECTED NOT DETECTED Final    Comment: (NOTE) The GeneXpert MRSA Assay (FDA approved for NASAL specimens only), is one component of a comprehensive MRSA colonization surveillance program. It is not intended to diagnose MRSA infection nor to guide or monitor treatment for MRSA infections. Test performance is not FDA approved in patients less than 31 years old. Performed at [facility], [address], [place], [place] [address]   Culture, blood (Routine X 2) w Reflex to ID Panel     Status: Abnormal   Collection Time: 09/15/22  5:55 PM   Specimen: BLOOD RIGHT ARM  Result Value Ref Range Status   Specimen Description BLOOD RIGHT ARM  Final   Special Requests   Final    BOTTLES DRAWN AEROBIC AND ANAEROBIC Blood Culture adequate volume   Culture  Setup Time   Final    GRAM POSITIVE COCCI IN CLUSTERS IN BOTH AEROBIC AND ANAEROBIC BOTTLES CRITICAL VALUE NOTED.  VALUE IS CONSISTENT WITH PREVIOUSLY REPORTED AND CALLED VALUE.    Culture (A)  Final    STAPHYLOCOCCUS AUREUS SUSCEPTIBILITIES PERFORMED ON PREVIOUS CULTURE WITHIN THE LAST 5 DAYS. Performed at [facility], [address] [place], [place] [address]    Report Status 09/18/2022 FINAL  Final  Culture, blood (Routine X 2) w Reflex to ID Panel     Status: Abnormal   Collection Time:  09/15/22  5:55 PM   Specimen: BLOOD RIGHT ARM  Result Value Ref Range Status   Specimen Description BLOOD RIGHT ARM  Final   Special Requests   Final    BOTTLES DRAWN AEROBIC AND ANAEROBIC Blood Culture adequate volume   Culture  Setup Time   Final    GRAM POSITIVE COCCI IN CLUSTERS IN BOTH AEROBIC AND ANAEROBIC BOTTLES CRITICAL RESULT CALLED TO, READ BACK BY AND VERIFIED WITH: A. PAYTES PHARMD, AT 0981 09/16/22 Renato Shin Performed at [facility], [address], [place], [place] [address]    Culture STAPHYLOCOCCUS AUREUS (A)  Final   Report Status 09/18/2022 FINAL  Final   Organism ID, Bacteria STAPHYLOCOCCUS AUREUS  Final      Susceptibility   Staphylococcus aureus - MIC*    CIPROFLOXACIN <=0.5 SENSITIVE Sensitive     ERYTHROMYCIN <=0.25 SENSITIVE Sensitive     GENTAMICIN <=0.5 SENSITIVE Sensitive     OXACILLIN 0.5 SENSITIVE Sensitive     TETRACYCLINE <=1 SENSITIVE Sensitive     VANCOMYCIN <=0.5 SENSITIVE Sensitive     TRIMETH/SULFA <=10 SENSITIVE Sensitive     CLINDAMYCIN <=0.25 SENSITIVE Sensitive     RIFAMPIN <=0.5 SENSITIVE Sensitive     Inducible Clindamycin NEGATIVE Sensitive     LINEZOLID 2 SENSITIVE Sensitive     * STAPHYLOCOCCUS AUREUS  Blood Culture ID Panel (Reflexed)     Status: Abnormal   Collection Time: 09/15/22  5:55 PM  Result Value Ref Range Status   Enterococcus faecalis NOT DETECTED NOT DETECTED Final   Enterococcus Faecium NOT DETECTED NOT DETECTED Final   Listeria monocytogenes NOT DETECTED NOT DETECTED Final   Staphylococcus species DETECTED (A) NOT DETECTED Final    Comment: CRITICAL RESULT CALLED TO, READ BACK BY AND VERIFIED WITH: A. PAYTES PHARMD, AT 8295 09/16/22 D. VANHOOK    Staphylococcus aureus (BCID) DETECTED (A) NOT DETECTED Final    Comment:  CRITICAL RESULT CALLED TO, READ BACK BY AND VERIFIED WITH: A. PAYTES PHARMD, AT 8295 09/16/22 D. VANHOOK    Staphylococcus epidermidis NOT DETECTED NOT DETECTED Final   Staphylococcus  lugdunensis NOT DETECTED NOT DETECTED Final   Streptococcus species NOT DETECTED NOT DETECTED Final   Streptococcus agalactiae NOT DETECTED NOT DETECTED Final   Streptococcus pneumoniae NOT DETECTED NOT DETECTED Final   Streptococcus pyogenes NOT DETECTED NOT DETECTED Final   A.calcoaceticus-baumannii NOT DETECTED NOT DETECTED Final   Bacteroides fragilis NOT DETECTED NOT DETECTED Final   Enterobacterales NOT DETECTED NOT DETECTED Final   Enterobacter cloacae complex NOT DETECTED NOT DETECTED Final   Escherichia coli NOT DETECTED NOT DETECTED Final   Klebsiella aerogenes NOT DETECTED NOT DETECTED Final   Klebsiella oxytoca NOT DETECTED NOT DETECTED Final   Klebsiella pneumoniae NOT DETECTED NOT DETECTED Final   Proteus species NOT DETECTED NOT DETECTED Final   Salmonella species NOT DETECTED NOT DETECTED Final   Serratia marcescens NOT DETECTED NOT DETECTED Final   Haemophilus influenzae NOT DETECTED NOT DETECTED Final   Neisseria meningitidis NOT DETECTED NOT DETECTED Final   Pseudomonas aeruginosa NOT DETECTED NOT DETECTED Final   Stenotrophomonas maltophilia NOT DETECTED NOT DETECTED Final   Candida albicans NOT DETECTED NOT DETECTED Final   Candida auris NOT DETECTED NOT DETECTED Final   Candida glabrata NOT DETECTED NOT DETECTED Final   Candida krusei NOT DETECTED NOT DETECTED Final   Candida parapsilosis NOT DETECTED NOT DETECTED Final   Candida tropicalis NOT DETECTED NOT DETECTED Final   Cryptococcus neoformans/gattii NOT DETECTED NOT DETECTED Final   Meth resistant mecA/C and MREJ NOT DETECTED NOT DETECTED Final    Comment: Performed at [facility], [address], [place], [place] [address]  Body fluid culture w Gram Stain     Status: None   Collection Time: 09/16/22  3:59 PM   Specimen: Synovium; Body Fluid  Result Value Ref Range Status   Specimen Description SYNOVIAL  Final   Special Requests RIGHT KNEE  Final   Gram Stain   Final    ABUNDANT WBC PRESENT,  PREDOMINANTLY PMN FEW GRAM POSITIVE COCCI IN PAIRS CRITICAL RESULT CALLED TO, READ BACK BY AND VERIFIED WITH: RN PEYTON RIDGE ON 09/16/22 @ 1647 BY DRT Performed at [facility], [address] [place], [place] [address]    Culture ABUNDANT STAPHYLOCOCCUS AUREUS  Final   Report Status 09/18/2022 FINAL  Final   Organism ID, Bacteria STAPHYLOCOCCUS AUREUS  Final      Susceptibility   Staphylococcus aureus - MIC*    CIPROFLOXACIN <=0.5 SENSITIVE Sensitive     ERYTHROMYCIN <=0.25 SENSITIVE Sensitive     GENTAMICIN <=0.5 SENSITIVE Sensitive     OXACILLIN <=0.25 SENSITIVE Sensitive     TETRACYCLINE <=1 SENSITIVE Sensitive     VANCOMYCIN <=0.5 SENSITIVE Sensitive     TRIMETH/SULFA <=10 SENSITIVE Sensitive     CLINDAMYCIN <=0.25 SENSITIVE Sensitive     RIFAMPIN <=0.5 SENSITIVE Sensitive     Inducible Clindamycin NEGATIVE Sensitive     LINEZOLID 2 SENSITIVE Sensitive     * ABUNDANT STAPHYLOCOCCUS AUREUS  Culture, blood (Routine X 2) w Reflex to ID Panel     Status: None (Preliminary result)   Collection Time: 09/18/22  6:31 AM   Specimen: BLOOD  Result Value Ref Range Status   Specimen Description BLOOD RIGHT ANTECUBITAL  Final   Special Requests   Final    BOTTLES DRAWN AEROBIC AND ANAEROBIC Blood Culture results may  not be optimal due to an inadequate volume of blood received in culture bottles   Culture   Final    NO GROWTH 4 DAYS Performed at [facility], [address], [place], [place] [address]    Report Status PENDING  Incomplete  Culture, blood (Routine X 2) w Reflex to ID Panel     Status: None (Preliminary result)   Collection Time: 09/18/22  6:42 AM   Specimen: BLOOD RIGHT ARM  Result Value Ref Range Status   Specimen Description BLOOD RIGHT ARM  Final   Special Requests   Final    BOTTLES DRAWN AEROBIC ONLY Blood Culture adequate volume   Culture   Final    NO GROWTH 4 DAYS Performed at [facility], [address], [place], [place] [address]     Report Status PENDING  Incomplete     Labs: Basic Metabolic Panel: Recent Labs  Lab 09/16/22 0204 09/17/22 0312 09/18/22 0645 09/18/22 2143 09/19/22 0205 09/20/22 0239  NA 133* 134* 133*  --  136 137  K 3.7 3.2* 3.6  --  4.2 4.4  CL 94* 97* 96*  --  101 102  CO2 27 22 22   --  24 22  GLUCOSE 142* 150* 152*  --  171* 116*  BUN 44* 40* 33*  --  34* 62*  CREATININE 1.90* 1.65* 1.43* 1.34* 1.33* 2.29*  CALCIUM 8.7* 9.2 9.7  --  8.9 8.1*   Liver Function Tests: No results for input(s): "AST", "ALT", "ALKPHOS", "BILITOT", "PROT", "ALBUMIN" in the last 168 hours. No results for input(s): "LIPASE", "AMYLASE" in the last 168 hours. No results for input(s): "AMMONIA" in the last 168 hours. CBC: Recent Labs  Lab 09/16/22 0204 09/17/22 0312 09/18/22 0645 09/18/22 2143 09/19/22 0205 09/20/22 0239  WBC 18.7* 17.8* 18.6* 19.2* 16.9* 27.5*  NEUTROABS 15.9* 15.0* 16.3*  --  15.3* 24.8*  HGB 10.0* 10.3* 10.6* 10.4* 9.5* 10.1*  HCT 30.3* 29.8* 32.8* 31.5* 28.7* 30.4*  MCV 99.7 97.7 98.8 101.0* 98.3 97.7  PLT 364 420* 471* 439* 421* 578*   Cardiac Enzymes: No results for input(s): "CKTOTAL", "CKMB", "CKMBINDEX", "TROPONINI" in the last 168 hours. BNP: BNP (last 3 results) Recent Labs    09/12/22 1311  BNP 131.6*    ProBNP (last 3 results) No results for input(s): "PROBNP" in the last 8760 hours.  CBG: Recent Labs  Lab 09/21/22 0734 09/21/22 1128 09/21/22 1604 09/21/22 2122 09/22/22 0750  GLUCAP 126* 145* 128* 116* 150*       Signed:  Zannie Cove MD.  [facility] 09/22/2022, 10:02 AM

## 2022-09-22 NOTE — Progress Notes (Signed)
   I was successful in speaking with the pt's son Denyse Amass this am and introduced him to hospice care at home with hospice services. The goals are in line with hospice philosophy and they are interested in proceeding with referral process. He will need some equipment needs but the son had to go into a meeting prior to [place] discussing this need. Denyse Amass is suppose to call em back after meeting for [place] to discuss further.   I have reviewed with our MD and he has been approved for hospice care at home.   Norm Parcel RN [phone]

## 2022-09-22 NOTE — Progress Notes (Signed)
Brief ID note:  Patient seen this morning in his room on [address].  He appeared comfortable but somewhat withdrawn given the circumstances.  He reported that the infusion of nafcillin was burning his vein.  We discussed the severity and extent of his infection and that surgery is prohibitive.  Will stop nafcillin and transition over to cefazolin while he remains inpatient.  It appears that he will be going home with hospice which seems like the most appropriate thing to do at this time.  At discharge we will continue oral cefadroxil 500 mg twice daily for approximately 2 weeks to avoid overwhelming sepsis.  We will sign off, please call as needed.    Vedia Coffer for Infectious Disease [facility] 09/22/2022, 2:20 PM

## 2022-09-22 NOTE — Care Management Important Message (Signed)
Important Message  Patient Details  Name: Johnny [name] MRN: [id number] Date of Birth: [birth year]   Medicare Important Message Given:  Yes     Renie Ora 09/22/2022, 10:22 AM

## 2022-09-23 ENCOUNTER — Inpatient Hospital Stay (HOSPITAL_COMMUNITY): Payer: Medicare Other

## 2022-09-23 ENCOUNTER — Encounter (HOSPITAL_COMMUNITY)
Admission: EM | Disposition: A | Discharge status: Hospice | Payer: Self-pay | Source: Home / Self Care | Attending: Internal Medicine

## 2022-09-23 ENCOUNTER — Encounter (HOSPITAL_COMMUNITY): Payer: Self-pay | Admitting: Orthopedic Surgery

## 2022-09-23 DIAGNOSIS — I71011 Dissection of aortic arch: Secondary | ICD-10-CM

## 2022-09-23 LAB — GLUCOSE, CAPILLARY
Glucose-Capillary: 143 mg/dL — ABNORMAL HIGH (ref 70–99)
Glucose-Capillary: 131 mg/dL — ABNORMAL HIGH (ref 70–99)
Glucose-Capillary: 130 mg/dL — ABNORMAL HIGH (ref 70–99)
Glucose-Capillary: 132 mg/dL — ABNORMAL HIGH (ref 70–99)

## 2022-09-23 LAB — CULTURE, BLOOD (ROUTINE X 2): Culture: NO GROWTH

## 2022-09-23 SURGERY — ECHOCARDIOGRAM, TRANSESOPHAGEAL
Anesthesia: Monitor Anesthesia Care

## 2022-09-23 MED ORDER — TAMSULOSIN HCL 0.4 MG PO CAPS
0.4000 mg | ORAL_CAPSULE | Freq: Every day | ORAL | Status: DC
  Administered 2022-09-23 – 2022-09-24 (×2): 0.4 mg via ORAL
  Filled 2022-09-23 (×2): qty 1

## 2022-09-23 NOTE — Progress Notes (Signed)
Patient ID: [name], male   DOB: [birth year], 77 y.o.   MRN: [id number]    Progress Note from the Palliative Medicine Team at [facility]   Patient Name: Johnny [name]        Date: 09/23/2022 DOB: 09-May-1945  Age: 77 y.o. MRN#: [id number] Attending Physician: Coralie Keens Primary Care Physician: Daisy Floro, MD Admit Date: 09/12/2022   Medical records reviewed, discussed with transition of care team  Plan is for discharge home in the morning with hospice services in place.  PMT will sign off at this time.  No charge   Lorinda Creed NP  Palliative Medicine Team Team Phone # [phone] Pager [phone]

## 2022-09-23 NOTE — TOC Transition Note (Addendum)
Transition of Care [facility]) - CM/SW Discharge Note   Patient Details  Name: Johnny [name] MRN: [id number] Date of Birth: [birth year]  Transition [facility] CM/SW Contact:  Gala Lewandowsky, RN Phone Number: 09/23/2022, 11:12 AM   Clinical Narrative:  Patient was discussed in progression rounds. Plan will be for home with Hospice Services. Case Manager reached out to son Denyse Amass and he states he has not received a time for DME delivery. Discussed with son to call Case Manager with the delivery time window, so the Case Manager can call [facility] for transportation home. Case Manager also call [facility] to see if Liaison can call for a delivery time. Case Manager continuing to follow.    1330 09-23-22 Case Manager spoke with patients son regarding DME and son states that DME can be delivered on Thursday morning from 10:00-11:00 am. Son is aware that Case Manager will be calling [facility] at 11:00 am in the morning for transport home. MD and Staff RN are aware of above information.   Final next level of care: Home w Hospice Care Barriers to Discharge: No Barriers Identified   Patient Goals and CMS Choice CMS Medicare.gov Compare Post Acute Care list provided to:: Patient Choice offered to / list presented to : Adult Children, Patient   Discharge Plan and Services Additional resources added to the After Visit Summary for   In-house Referral: NA Discharge Planning Services: CM Consult Post Acute Care Choice: Hospice          DME Arranged:  (DME via Hospice)     HH Arranged: RN HH Agency: [facility] Date HH Agency Contacted: 09/22/22 Time HH Agency Contacted: 1400 Representative spoke with at [facility] Agency: Cheri  Social Determinants of Health (SDOH) Interventions SDOH Screenings   Food Insecurity: No Food Insecurity (09/12/2022)  Housing: Low Risk  (09/12/2022)  Transportation Needs: No Transportation Needs (09/12/2022)  Utilities: Not At Risk (09/12/2022)   Tobacco Use: Medium Risk (09/18/2022)   Readmission Risk Interventions     No data to display

## 2022-09-23 NOTE — Progress Notes (Addendum)
Mr. Gelb is feeling well today, knee pain is controlled with analgesics, has no chest pain or dyspnea.   BP 117/74 (BP Location: Right Arm)   Pulse 83   Temp 98.3 F (36.8 C) (Oral)   Resp 20   Ht 5\' 7"  (1.702 m)   Wt 95.3 kg   SpO2 96%   BMI 32.89 kg/m   Awake and alert ENT with mild pallor Cardiovascular with S1 and S2 present and rhythmic Respiratory with no rales or wheezing Abdomen with no distention  No lower extremity edema,  Right knee with dressing in place.   Plan: Patient is being discharged today home to continue care under hospice services. Continue oral antibiotic therapy. He will keep foley catheter due to poor mobility and palliative care.  Follow up with hospice team.   I spoke with patient's son over the phone, we talked in detail about patient's condition, plan of care and prognosis and all questions were addressed.

## 2022-09-23 NOTE — Progress Notes (Signed)
Patient is unable to lie flat without complaints of not being able to breath. Patient has a non productive cough. His lung sounds are now diminished on the left and wheezy on the right. Crosley MD notified. Please see new orders.

## 2022-09-23 NOTE — Progress Notes (Signed)
Patient's RN put in the consult for a PIV access, but this patient's stable condition and possible discharge home with hospice care tomorrow. Talked Dr. Ella Jubilee regarding this matter and no PIV access at this time. HS [employer]

## 2022-09-24 ENCOUNTER — Other Ambulatory Visit (HOSPITAL_COMMUNITY): Payer: Self-pay

## 2022-09-24 DIAGNOSIS — I5033 Acute on chronic diastolic (congestive) heart failure: Secondary | ICD-10-CM | POA: Diagnosis not present

## 2022-09-24 DIAGNOSIS — I2510 Atherosclerotic heart disease of native coronary artery without angina pectoris: Secondary | ICD-10-CM | POA: Diagnosis not present

## 2022-09-24 DIAGNOSIS — I10 Essential (primary) hypertension: Secondary | ICD-10-CM | POA: Diagnosis not present

## 2022-09-24 DIAGNOSIS — M00061 Staphylococcal arthritis, right knee: Secondary | ICD-10-CM | POA: Diagnosis not present

## 2022-09-24 LAB — GLUCOSE, CAPILLARY: Glucose-Capillary: 168 mg/dL — ABNORMAL HIGH (ref 70–99)

## 2022-09-24 MED ORDER — FUROSEMIDE 40 MG PO TABS
40.0000 mg | ORAL_TABLET | Freq: Once | ORAL | Status: AC
  Administered 2022-09-24: 40 mg via ORAL
  Filled 2022-09-24: qty 1

## 2022-09-24 NOTE — Care Management (Signed)
1112  Hospital bed has been delivered to the home. [facility] has been notified of plan for discharge today. [facility] will call the family for visit times. Case Manager has called [facility] for transport home. ETA within less than an hour per [facility]. No further needs identified at this time.

## 2022-09-24 NOTE — Progress Notes (Signed)
Mr. Menaker is feeling well this morning, endorses willing to go home today. No chest pain, no dyspnea, knee pain is controlled. His discharge was delayed yesterday due to pending medical equipment to be delivered to his home.   BP 111/63 (BP Location: Left Arm)   Pulse 81   Temp 98.7 F (37.1 C) (Oral)   Resp 20   Ht 5\' 7"  (1.702 m)   Wt 95.3 kg   SpO2 96%   BMI 32.89 kg/m   Neurology awake and alert ENT with mild pallor Cardiovascular with S1 and S2 present and rhythmic Respiratory with no rales or wheezing Abdomen with no distention  Right knee with dressing in place, mild local edema.   Dx. 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   Plan to transfer home with hospice services today.

## 2022-09-26 DEATH — deceased

## 2022-11-12 ENCOUNTER — Ambulatory Visit: Payer: Medicare Other | Admitting: Allergy

## 2024-03-14 ENCOUNTER — Encounter: Payer: Self-pay | Admitting: Oncology

## 2024-03-20 ENCOUNTER — Encounter: Payer: Self-pay | Admitting: Oncology

## 2024-04-03 ENCOUNTER — Telehealth (INDEPENDENT_AMBULATORY_CARE_PROVIDER_SITE_OTHER): Payer: Self-pay
